# Patient Record
Sex: Female | Born: 1990 | Race: Black or African American | Hispanic: No | Marital: Married | Smoking: Former smoker
Health system: Southern US, Community
[De-identification: ages and names within clinical notes are randomized; demographics above are authoritative.]

## PROBLEM LIST (undated history)

## (undated) DIAGNOSIS — J309 Allergic rhinitis, unspecified: Secondary | ICD-10-CM

## (undated) DIAGNOSIS — E109 Type 1 diabetes mellitus without complications: Secondary | ICD-10-CM

## (undated) DIAGNOSIS — E119 Type 2 diabetes mellitus without complications: Secondary | ICD-10-CM

## (undated) DIAGNOSIS — E1110 Type 2 diabetes mellitus with ketoacidosis without coma: Secondary | ICD-10-CM

## (undated) DIAGNOSIS — Z9119 Patient's noncompliance with other medical treatment and regimen: Secondary | ICD-10-CM

## (undated) HISTORY — PX: TONSILLECTOMY: SUR1361

## (undated) HISTORY — DX: Patient's noncompliance with other medical treatment and regimen: Z91.19

## (undated) HISTORY — DX: Allergic rhinitis, unspecified: J30.9

## (undated) HISTORY — DX: Type 1 diabetes mellitus without complications: E10.9

---
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
## 2011-01-01 NOTE — Discharge Summary (Signed)
Beverly Wells, Beverly Wells                        ACCOUNT NO.:  [id number]   MEDICAL RECORD NO.:  [id number]                   PATIENT TYPE:  INP   LOCATION:  6119                                 FACILITY:  [facility]   PHYSICIAN:  Leighton Roach McDiarmid, M.D.             DATE OF BIRTH:  Apr 19, 1991   DATE OF ADMISSION:  04/27/2003  DATE OF DISCHARGE:  05/02/2003                                 DISCHARGE SUMMARY   DISCHARGE DIAGNOSES:  1. Diabetic ketoacidosis.  2. Insulin-dependent diabetes mellitus.   DISCHARGE MEDICATIONS:  1. Regular insulin 11 units before breakfast each morning and 11 units     before dinner each afternoon.  2. NPH insulin 21 units before breakfast and 19 units before dinner.   The patient was instructed to resume all other medications.   DISPOSITION:  The patient discharged to home.   FOLLOWUP:  The patient was scheduled on Wednesday, May 08, 2003, at 4  p.m. with the [facility]  appointment. This  appointment was actually for her caregivers to receive diabetes education as  we felt that the guardians and caregivers definitely need more education as  to how to control the patient's diabetes.  The patient has an  appointment  on Friday, May 10, 2003, at 12:20 p.m. with Dr. Hal Hope at [facility]. The patient was also instructed to make an  appointment with Dr. Jon Billings at [facility].  Special  instructions are given to the patient to check her blood sugar before  breakfast, before lunch, at school during the week, before dinner, and at  bedtime. She is instructed to report all numbers on a sheet which was  printed up by the child psychologist here and she was instructed to take  that sheet to all doctor's  appointment.   BRIEF ADMISSION HISTORY:  The patient is a 20 year old African American  female who presented in diabetic ketoacidosis. She was diagnosed with  diabetes in March 2004.  The  patient presented with a two- to three-day  history of vomiting, positive bedwetting over the past two to three weeks,  and  history of constipation. She had no fevers.   Her admission labs--BMET of sodium 135, potassium 5, chloride 111, CO2 16,  BUN 16, creatinine 0.8, glucose 513. She had a pH of 6.9, PCO2 26.9, and a  bicarbonate of 6. She had a white blood cell count of 16.4.   HOSPITAL COURSE:  Problem #1.  DKA.  The patient was admitted to the PICU  and placed on an insulin drip at 0.5 units/kg. He is on normal saline with  20 mEq of potassium at 200 ml per hour.  The patient had a BMET checked  every four hours, glucose checked every hour, and urine ketones checked  every hour.  After the acidosis was corrected, the patient was switched to  subcu insulin. After the patient was removed from  the insulin drip she was  transferred to a regular floor.  Before being discharged, the patient's  insulin was fine tuned to the above discharge medications. Also, social work  and child psychology were involved. The social worker was involved to help  the caregivers get a better understanding of the child's medical condition.  It was thought that she did not have adequate surveillance doing medication  administration and checking her blood sugars. It was thought that she was  either not checking her blood sugars on a daily basis or she was falsifying  her blood stick considering that her glucometer rally showed a reading  greater than 150, yet the patient's hemoglobin A1C was 15.  Child psychology  worked with the patient diligently to get her to understand what she needed  to do on a daily basis and provide her with the materials that she needed.  The patient was discharged with all appropriate school forms and also home  health nursing was set up to visit the home twice a week to download  glucometer readings and to observe insulin administration, technique, and  CBG testing with glucometer at  least once a week. The home health nurse  should report any CBG greater than 300 to Dr. Jon Billings at [facility] and to  Dr. Hal Hope at [facility].      Anastasio Auerbach, MD                          Leighton Roach McDiarmid, M.D.    AD/MEDQ  D:  05/12/2003  T:  05/13/2003  Job:  [id number]   cc:   Dr. Durwin Nora, [place] L. Hal Hope, M.D.  [address] [place]  [place] [address]  Fax: [phone]

## 2011-01-01 NOTE — Discharge Summary (Signed)
NAMESUZZANNE, Beverly Wells            ACCOUNT NO.:  [id number]   MEDICAL RECORD NO.:  [id number]          PATIENT TYPE:  INP   LOCATION:  6123                         FACILITY:  [facility]   PHYSICIAN:  Ileene Patrick        DATE OF BIRTH:  Jan 09, 1991   DATE OF ADMISSION:  05/06/2006  DATE OF DISCHARGE:                                 DISCHARGE SUMMARY   DATE OF DISCHARGE:  05/07/2006   REASON FOR HOSPITALIZATION:  Hyperglycemia.   SIGNIFICANT FINDINGS:  The patient's labs on admission were notable for a  bicarb of 22, BUN 18, creatinine 1.2 and glucose at 662.  Urinalysis had  showed specific gravity of 1.043, pH 6.0 with greater than 80 ketones and  greater than 1000 glucose.  The patient, upon interview, was found to be  noncompliant with her home insulin regimen prescribed for her diagnosis of  type 1 diabetes mellitus.  During hospitalization, the patient was resumed  on her home insulin regimen, which is 50 units of Lantus each bedtime,  NovoLog 3 times a day at meals given as 1 unit per 50 of glucose greater  than 150 and 1 unit per 15 grams of carbs to be eaten with that meal.  Each  bedtime snacks and insulin regimen is based on nomogram given by Dr. Judie Grieve.  The patient's hyperglycemia quickly resolved on this home regimen of insulin  and intravenous fluids.  Capillary blood glucose has dropped steadily and at  1430 on 05/07/2006, or at 121.  There was no evidence of DKA at any point in  this hospitalization.  There was no evidence of infection on exam.  This  hyperglycemia is apparently due to noncompliance with her insulin regimen.   TREATMENT:  Resumption of home insulin regimen as described above.  Intravenous fluids.   OPERATIONS AND PROCEDURES:  None.   FINAL DIAGNOSES:  1. Hyperglycemia secondary to insulin noncompliance.  2. Insulin dependent diabetes mellitus, type 1.   DISCHARGE MEDICATIONS/INSTRUCTIONS:  The patient is to resume home insulin  regimen as prescribed.   Dr. Lindie Spruce was contacted today prior to discharge.  Dr. Lindie Spruce will follow up with the patient with DSS and with the patient's  therapist on Monday to address the patient's noncompliance.   PENDING RESULTS/ISSUES TO BE FOLLOWED:  None.   FOLLOWUP:  The patient has a previously scheduled appointment with Dr.  Fransico Michael on 06/20/2006.   DISCHARGE CONDITION:  Stable.           ______________________________  Ileene Patrick     GH/MEDQ  D:  05/07/2006  T:  05/07/2006  Job:  [id number]   cc:   Dr. Carlean Jews, MD

## 2011-01-01 NOTE — Discharge Summary (Signed)
Beverly Wells, Beverly Wells            ACCOUNT NO.:  [id number]   MEDICAL RECORD NO.:  [id number]          PATIENT TYPE:  INP   LOCATION:  6125                         FACILITY:  [facility]   PHYSICIAN:  Henrietta Hoover, MD    DATE OF BIRTH:  Jan 08, 1991   DATE OF ADMISSION:  02/01/2006  DATE OF DISCHARGE:  02/10/2006                                 DISCHARGE SUMMARY   DISCHARGE DIAGNOSES:  1.  Diabetic ketoacidosis.  2.  Candidiasis.  3.  Bacteremia with Klebsiella and enterobacter.  4.  Bacterial vaginosis.   DISCHARGE MEDICATIONS:  1.  Levemir 55 units subcutaneously before bed.  2.  NovoLog and sliding scale as directed one unit per 50, this is numbers      greater than 150 of blood glucose sugar level.  The patient is for carb      counting is one unit per 15 grams of carbs.  3.  Nystatin powder t.i.d.  4.  Glucagon 1 mg subcutaneously p.r.n. for hyperglycemia.  5.  Flagyl  500 mg p.o. b.i.d. times six days.  6.  Zyrtec 10 mg p.o. one tablet daily.  7.  Bactrim DS two tablets p.o. b.i.d. times 10 days.  8.  Diflucan 150 mg p.o. times one dose after completing all of her      antibiotics.   DISCHARGE INSTRUCTIONS:  1.  The patient is to follow-up with Dr. Fransico Michael on April 12, 2006 at 1 PM.  2.  The patient is to follow-up with diabetic education when she returns      from her trip from [place].  3.  [facility] with Dr. Excell Seltzer on February 14, 2006 at 1 PM.   HOSPITAL COURSE:  This patient is a 20 year old African-American female with  type 1 diabetes who presented with vomiting, polyuria and polydipsia and was  found to be in DKA on admission.  The patient had a history of poor  compliance with diabetic regimen.  The patient was admitted to the PICU and  started on insulin and fluid management using the two-bag method.  After DKA  resolved, the patient was restarted on Lantus and carb counting and sliding  scale for her insulin.  The patient was transferred to the pediatric  floor  on February 03, 2006.  She received extensive diabetic education and nutrition  counseling and psychosocial assessments.  The patient was tolerating her  regular diet and giving herself insulin with adequate blood sugar control  prior to discharge with Lantus at 55 units q.h.s. at bedtime and the sliding  scale of one unit per 50 if sugar is greater than 150, and one unit per 15  grams of carbs.  The patient was also noted to have a candidal rash on  admission in her genitalia and her inner thighs.  She received Diflucan 150  mg p.o. times two and then nystatin powder three times a day.  The patient  also developed fevers on February 05, 2006.  She was well-appearing in between  her febrile episodes.  Initially there was some mild headache, however there  were none since.  Otherwise she  was asymptomatic.  On February 06, 2006, there  was a urinalysis which was negative, a urine culture which grew out  Klebsiella and staph aureus, and blood cultures that grew out gram-negative  rods, Klebsiella and enterobacter.  Her sodium on her BMET was 130,  otherwise it was normal.  Rapid strep was performed which was negative.  The  patient was medically stable.  She received four doses of ceftriaxone.  She  was afebrile after receiving antibiotics.  She was started on Flagyl for her  bacterial vaginosis.  She will complete a 10-day course of Bactrim DS at the  time of discharge to complete a total of a 14-day antibiotic course.  The  patient will be continued on the Diflucan after completing all of her  antibiotics secondary to known candidal infections.   LABORATORY DATA:  Admission labs were a white blood cell count of 9.4,  hemoglobin 15.5 and hematocrit of 52, platelet count of 343,000.  Sodium 133  potassium 5.9, chloride of 103, bicarbonate of 10, BUN of 14 and creatinine  of 1.4, glucose of 466, anion gap of 26.  ABGs showed a pH of 7.16 with a  bicarbonate of 8.  Urine pregnancy was negative.   Urinalysis showed a  specific gravity of 1.035, glucose greater than 1000, ketones greater than  80.  Free T3 was 2.7, free T4 was 1.27, TSH was 1.248.  This was obtained  secondary to her history of Hashimoto's thyroiditis.  The patient will be  followed up with Dr. Juluis Mire office and with Dr. Excell Seltzer as previously  scheduled.     ______________________________  Pediatrics Resident    ______________________________  Henrietta Hoover, MD    PR/MEDQ  D:  02/10/2006  T:  02/10/2006  Job:  [id number]

## 2011-01-01 NOTE — Discharge Summary (Signed)
Beverly Wells, Beverly Wells            ACCOUNT NO.:  [id number]   MEDICAL RECORD NO.:  [id number]          PATIENT TYPE:  INP   LOCATION:  6153                         FACILITY:  [facility]   PHYSICIAN:  Levander Campion, M.D.  DATE OF BIRTH:  [birth year]   DATE OF ADMISSION:  05/14/2006  DATE OF DISCHARGE:  05/17/2006                                 DISCHARGE SUMMARY   REASON FOR HOSPITALIZATION:  Poorly controlled Type I diabetes mellitus with  diabetic ketoacidosis.   SIGNIFICANT FINDINGS:  The patient presented to the [facility] emergency  department with nausea, vomiting, and labs consistent with DKA as follows:  Her VBG was 7.19/28.9/17.0/11.1/-15.  Her sodium was 134, her potassium was  4.9, her chloride was 96, bi carb 11, BUN 16, creatine 1.4, platelets 543.  The patient reported not taking her insulin regimen for 24 hours for an  unknown reason.  The patient is well known to the pediatric service and she  is felt to have a confounding psychiatric issue influencing her compliance.  The patient was admitted to the pediatric ICU.  She was re hydrated and  given IV insulin drip until her acidosis and electrolyte abnormalities  resolved.  The patient was then transferred to the floor and started on her  home regimen of insulin per her endocrine consultant, Dr. Fransico Michael.  Dr.  Lindie Spruce consulted for psychiatric issues and will arrange followup for her  psychiatric care with her multidisciplinary team.  She had no procedures.   FINAL DIAGNOSES:  1. Diabetic ketoacidosis.  2. Diabetes mellitus type I.  3. Hypokalemia.   DISCHARGE MEDICATIONS:  1. Levemir 50 units sub cu q.h.s.  2. Sliding scale and meal carbohydrate dosing as previously prescribed.  3. Zyrtec 10 mg p.o. daily.  4. K-Dur 40 mEq p.o. t.i.d. until the patient's basic metabolic panel is      rechecked at her primary doctor's office on Friday May 20, 2006.   FOLLOWUP:  The patient is to followup with Dr. Chales Salmon at 10:00  a.m. on  Friday, May 20, 2006.  At that time, she is to have a basic metabolic  panel drawn to follow up with her hypokalemia, which on discharge, her  potassium level was 3.0.           ______________________________  Levander Campion, M.D.     JH/MEDQ  D:  05/17/2006  T:  05/18/2006  Job:  [id number]

## 2011-01-01 NOTE — Discharge Summary (Signed)
Beverly Wells, Beverly Wells                        ACCOUNT NO.:  [id number]   MEDICAL RECORD NO.:  [id number]                   PATIENT TYPE:  INP   LOCATION:  6120                                 FACILITY:  [facility]   PHYSICIAN:  Leighton Roach McDiarmid, M.D.             DATE OF BIRTH:  06-30-1991   DATE OF ADMISSION:  11/01/2002  DATE OF DISCHARGE:  11/07/2002                                 DISCHARGE SUMMARY   DISCHARGE DIAGNOSES:  1. Diabetic ketoacidosis.  2. New onset diabetes.  3. Streptococcus pharyngitis.  4. Allergic rash.  5. Skin infection.   DISCHARGE MEDICATIONS:  1. Depakote, resume home dose.  2. Zyprexa, resume home dose.  3. Nystatin cream.  Apply to rash b.i.d.  4. Morning insulin regimen 20 units of NPH and 10 units of regular     subcutaneous injection 30 minutes prior to breakfast.  Dinner time     insulin requirement 10 units regular 30 minutes before dinner and 12     units NPH at bedtime.  Glucagon kit has been ordered 1 mg subcutaneous     injection if unconscious for low blood sugars.  She is adhere to the     following sliding scale insulin criteria with regular insulin.  Sugars     200 to 230, 1 unit; 231 to 260, 2 units; 261 to 290, 3 units, and 291 to     320, 4 units.  For every unit, 30 mg/dl above 914.  She is to call her MD     for capillary blood glucose greater than 500.  Instructions for blood     sugar checks are to check 30 minutes before breakfast, 2 hours after     lunch, and at bedtime. She is to chart these in her book and return to     both her primary physician and endocrinologist.   DIET:  Diabetic diet.  Recommendations are as taught while in hospital.   FOLLOW UP:  1. Dr. Danise Mina, endocrinologist in [place].  2. Dr. Ladona Ridgel, psychiatrist on 11/14/2002 at 2 o'clock p.m. regarding     behavioral problems, currently on Zyprexa and Depakote.  3. Diabetic outpatient nutrition management on April 6 at 3 o'clock to meet     with  Army Fossa.  4. [facility] on April 5 at 9 a.m.   HISTORY OF PRESENT ILLNESS:  The patient is a 20 year old African-American  female who presented with a past medical history significant for allergic  rhinitis, anuresis, and behavioral problems.  She was sent to [facility]  by nurse practitioner Toni Arthurs and Marcos Eke. Hal Hope, M.D. at [facility]  due to hyperglycemia and dehydration. The patient presented the day prior to  admission to [facility] complaining of sore throat.  The  patient was evaluated by her primary care physician and diagnosed with strep  pharyngitis and genital candidiasis.  She was also complaining of dysuria,  urgency, and daytime anuresis.  Urinalysis was ordered which showed large  glucose and ketones.  CBG in the office was too high to read.  CMET, CBC,  TSH, hemoglobin A1C, and  C Peptide were ordered and grossly abnormal with  glucose of 549, bicarb 14, sodium 122,  Hemoglobin A1C 11.1.  When the labs  came back, the patient was sent to [facility] for evaluation for DKA and new  diagnosis of diabetes.    HOSPITAL COURSE:  1. DIABETIC KETOACIDOSIS:  The patient was treated with aggressive IV fluids     rehydration with lactated Ringers and admitted to the pediatric intensive     care unit under the care of Dr. Gerome Sam.  She was begun on insulin     drip at 0.1 units/kg per hour with q.1h. CBG checks and q.2h. BMET and     venous blood gases.  Clinically, the patient remained stable.  She never     lost consciousness, was alert and oriented x 3 in no acute distress.  Her     blood gas on admission showed a pH of 7.224, PCO2 23, PO2 112, bicarb 12,     and base excess negative 16.  She was 97% on room air.  BMP showed a     sodium of 129, potassium 4.4, chloride 98, bicarb decreased at 12, BUN     12, creatinine 1.0, and glucose 421.  Anion gap on admission was 19.     Hemoglobin A1C was 11.1, and C peptide was less  than 1.  CBC on admission     showed a normal white count of 5.7; therefore strep pharyngitis was     unlikely to be cause of her DKA.  She continued on insulin drip until     blood sugar reached roughly 200. She continued to have mild ketones     present in her urine, but her acidosis quickly resolved by hospital day     #2.  On the day that she was switched over to subcutaneous insulin,     hospital day #2, and she was started at 0.5 units insulin per kilogram of     body weight per day and started at NPH 12 units and regular 5 units in     the morning, NPH 4 units at night, and regular 4 units at night.  CBG     checked were spaced out to q.a.c. and q.h.s. and q.a.m.  Diabetic     teaching was begun, and the patient was placed on a pediatric     carbohydrate modified diet. Urine ketones continued to be checked every     day.  Blood sugars continually remained in the 200 to 300s and even some     sugars in the 400s.  Every day, her NPH and regular were steadily     increased.  At discharge time, her fasting blood sugars are roughly 140s     to 150s.  She periodically does have elevated blood sugars in the     afternoon; however, she does not fully understand the diabetic diet and     has shown increased carbohydrate intake above what her goal is.  She was     set up for [facility] outpatient diabetes education center and was     encouraged to have all her family members present for this teaching.  She     also received education on  Accu-Cheks and insulin administration.  Her     mother and great aunt were both there for education.  She was discharged     home with a regimen of 20 units NPH and 10 units regular in the morning     and 12 units NPH and 10 units regular at bedtime.  She was instructed to     check fasting blood sugars in the morning 2 hours postprandial and at     bedtime.   1. STREPTOCOCCUS PHARYNGITIS:  The patient was checked for strep pharyngitis    and had a positive  strep test in the office.  She was started on     amoxicillin on hospital day #1 and quickly broke into an erythematous     maculopapular rash.  Amoxicillin was discontinued, and she was switched     to three days of Zithromax 500 mg daily and did well without     complications.  By hospital day #3, the patient stopped complaining of     sore throat and was able to drink without difficulty.  She remained     afebrile throughout her hospital stay.  For the allergic rash, she     received symptomatic care with Benadryl and Claritin.   1. CANDIDAL SKIN INFECTION:  The patient presented with a groin rash that     showed erythematous satellite lesions extending from her groin, below the     pubic dome and even had pruritus in her vaginal area.  She used     Clotrimazole cream for vaginal use and Nystatin cream for the satellite     lesions in the groin area. Her pruritus has decreased and is likely     secondary to hypoglycemia. She will continue using Nystatin cream twice a     day until the rash resolves.   1. BEHAVIORAL PROBLEMS:  The patient came in with a diagnosis of behavioral     problems, currently on Depakote and Zyprexa.  Per family history, she     gets in a lot of trouble at school and has not seen a psychiatrist in the     past.  Her Zyprexa was held during hospital stay, and she had no     behavioral changes or outbreaks while in the hospital.  She may resume     this as an outpatient, and social work was consulted and set up an     appointment with Dr. Ladona Ridgel, psychiatrist, on 11/14/2002.  She was also     seen by Dr. Lindie Spruce, our in hospital child psychologist.   1. SOCIAL:  Custody was an issue during her hospitalization.  Apparently the     patient's mother does not liver full time at the child's home.  This     should be explored further as an outpatient as there is risk for this     child to go into diabetic ketoacidosis if not fully observed with her     insulin injections  and blood sugar checks.  It is also very important     that her primary care givers adhere to diabetic diet.  Please review as     an outpatient.   FOLLOW UP:  1. Compliance with insulin injections.  May need to titrate up on her     insulin dose if still hyperglycemic.  2. Compliance with insulin and blood sugar checks at school.  3. Follow up with Dr. Ladona Ridgel, psychiatrist, on 11/14/2002.  4. Follow  up with Dr. Danise Mina, endocrinologist in [place].     Appointment to be made.  5. Follow up with [facility] outpatient diabetic education center.  6. Follow up with [facility] April 5.  7. Consider changing Zyprexa and Depakote.  8. Review social situation once again and make sure that someone is     observing the patient's administration of insulin, diet, and blood sugar     checks.     Lorne Skeens, D.O.                         Etta Grandchild, M.D.    Erick Alley  D:  11/08/2002  T:  11/09/2002  Job:  [id number]  cc:   Clydie Braun L. Hal Hope, M.D.  [address] [place]  [place] [address]  Fax: [phone]   Danise Mina, M.D.  [place] [phone])   Carolanne Grumbling, M.D.  Fax: [phone]

## 2011-01-01 NOTE — Discharge Summary (Signed)
Beverly Wells, Beverly Wells            ACCOUNT NO.:  [id number]   MEDICAL RECORD NO.:  [id number]          PATIENT TYPE:  INP   LOCATION:  6125                         FACILITY:  [facility]   PHYSICIAN:  Henrietta Hoover, MD    DATE OF BIRTH:  09-Sep-1990   DATE OF ADMISSION:  02/01/2006  DATE OF DISCHARGE:  02/10/2006                                 DISCHARGE SUMMARY   DISCHARGE DIAGNOSES:  1.  Diabetic __________.  2.  Candidiasis.  3.  Bacteremia with Klebsiella enterobacter.  4.  Bacterial vaginosis.   DISCHARGE MEDICATIONS:  1.  Levemir 55 units subcutaneously before bed.  2.  NovoLog aspart take correction sliding scale as directed.  Sliding scale      is one unit per 50 of glucose greater than 150.  Carb counting is one      unit per 15 g of carbs.  3.  Nystatin powder t.i.d.  4.  __________ subcutaneously p.r.n. hyperglycemia.  5.  Flagyl 700 mg p.o. b.i.d. x6 days.  6.  Bactrim DS two tablets p.o. b.i.d.  7.  Diflucan 150 mg p.o. x1 dose.  8.  Zyrtec 10 mg tablets p.o. two daily.   DISCHARGE INSTRUCTIONS:  FOLLOW UP:  Patient is to followup with Dr. Manson Passey  on April 12, 2006, at 1 p.m.   Dictation ended at this point.           ______________________________  Henrietta Hoover, MD     SN/MEDQ  D:  02/10/2006  T:  02/10/2006  Job:  [id number]

## 2011-01-01 NOTE — Discharge Summary (Signed)
NAMEDELITA, CHIQUITO            ACCOUNT NO.:  [id number]   MEDICAL RECORD NO.:  [id number]          PATIENT TYPE:  INP   LOCATION:  6125                         FACILITY:  [facility]   PHYSICIAN:  Henrietta Hoover, MD    DATE OF BIRTH:  [birth year]   DATE OF ADMISSION:  02/01/2006  DATE OF DISCHARGE:  02/10/2006                                 DISCHARGE SUMMARY   ATTENDING PHYSICIAN ON DATE OF ADMISSION:  Tyrone Apple. Sharol Harness, MD   ATTENDING PHYSICIAN ON DATE OF DISCHARGE:  Henrietta Hoover, MD   DISCHARGE DIAGNOSES:  1.  Diabetic ketoacidosis.  2.  Vaginal candidiasis.  3.  Bacteremia with Klebsiella and Enterobacter.  4.  Bacterial vaginosis.   OPERATIONS AND PROCEDURES:  Admission labs were significant for a sodium of  133, chloride of 103, bicarb of 10, potassium of 5.9, a BUN of 14, and  creatinine of 1.4, with glucose of 466.  Anion gap was 26.  ABG was 7.16 pH,  CO2 was 23, oxygen was 103, bicarb was 8, base excess was -18.  She had  urine pregnancy was negative.  Urinalysis showed greater than 1000 glucose,  greater than 80 ketones, and a specific gravity of 1.035.  White count was  9.4, hemoglobin 16.5, hematocrit of 52, and platelets   Dictation ended at this point.      Barth Kirks, M.D.    ______________________________  Henrietta Hoover, MD    MB/MEDQ  D:  02/10/2006  T:  02/10/2006  Job:  [id number]

## 2011-01-17 ENCOUNTER — Emergency Department (HOSPITAL_COMMUNITY): Payer: No Typology Code available for payment source

## 2011-01-17 ENCOUNTER — Emergency Department (HOSPITAL_COMMUNITY)
Admission: EM | Admit: 2011-01-17 | Discharge: 2011-01-18 | Disposition: A | Discharge status: Home / Self Care | Payer: No Typology Code available for payment source | Attending: Emergency Medicine | Admitting: Emergency Medicine

## 2011-01-17 DIAGNOSIS — M25569 Pain in unspecified knee: Secondary | ICD-10-CM | POA: Insufficient documentation

## 2011-01-17 DIAGNOSIS — M542 Cervicalgia: Secondary | ICD-10-CM | POA: Insufficient documentation

## 2011-01-17 DIAGNOSIS — T148XXA Other injury of unspecified body region, initial encounter: Secondary | ICD-10-CM | POA: Insufficient documentation

## 2011-01-17 DIAGNOSIS — Z794 Long term (current) use of insulin: Secondary | ICD-10-CM | POA: Insufficient documentation

## 2011-01-17 DIAGNOSIS — E119 Type 2 diabetes mellitus without complications: Secondary | ICD-10-CM | POA: Insufficient documentation

## 2011-01-17 DIAGNOSIS — M549 Dorsalgia, unspecified: Secondary | ICD-10-CM | POA: Insufficient documentation

## 2011-01-18 ENCOUNTER — Ambulatory Visit: Payer: Medicaid Other | Admitting: Endocrinology

## 2011-01-18 DIAGNOSIS — Z0289 Encounter for other administrative examinations: Secondary | ICD-10-CM

## 2011-05-10 ENCOUNTER — Emergency Department (HOSPITAL_BASED_OUTPATIENT_CLINIC_OR_DEPARTMENT_OTHER)
Admission: EM | Admit: 2011-05-10 | Discharge: 2011-05-10 | Disposition: A | Discharge status: Home / Self Care | Payer: Medicaid Other | Attending: Emergency Medicine | Admitting: Emergency Medicine

## 2011-05-10 ENCOUNTER — Encounter (HOSPITAL_BASED_OUTPATIENT_CLINIC_OR_DEPARTMENT_OTHER): Payer: Self-pay

## 2011-05-10 DIAGNOSIS — E1169 Type 2 diabetes mellitus with other specified complication: Secondary | ICD-10-CM | POA: Insufficient documentation

## 2011-05-10 DIAGNOSIS — K5289 Other specified noninfective gastroenteritis and colitis: Secondary | ICD-10-CM | POA: Insufficient documentation

## 2011-05-10 DIAGNOSIS — R739 Hyperglycemia, unspecified: Secondary | ICD-10-CM

## 2011-05-10 DIAGNOSIS — R112 Nausea with vomiting, unspecified: Secondary | ICD-10-CM | POA: Insufficient documentation

## 2011-05-10 DIAGNOSIS — R109 Unspecified abdominal pain: Secondary | ICD-10-CM | POA: Insufficient documentation

## 2011-05-10 DIAGNOSIS — Z794 Long term (current) use of insulin: Secondary | ICD-10-CM | POA: Insufficient documentation

## 2011-05-10 DIAGNOSIS — K529 Noninfective gastroenteritis and colitis, unspecified: Secondary | ICD-10-CM

## 2011-05-10 LAB — URINALYSIS, ROUTINE W REFLEX MICROSCOPIC
Bilirubin Urine: NEGATIVE
Ketones, ur: >80 mg/dL — AB
Nitrite: NEGATIVE
Protein, ur: NEGATIVE mg/dL
Specific Gravity, Urine: 1.038 — ABNORMAL HIGH (ref 1.005–1.030)
Urobilinogen, UA: 0.2 mg/dL (ref 0.0–1.0)

## 2011-05-10 LAB — POCT I-STAT 3, VENOUS BLOOD GAS (G3P V)
Bicarbonate: 19 mEq/L — ABNORMAL LOW (ref 20.0–24.0)
TCO2: 20 mmol/L (ref 0–100)
pCO2, Ven: 39.1 mmHg — ABNORMAL LOW (ref 45.0–50.0)
pH, Ven: 7.291 (ref 7.250–7.300)
pO2, Ven: 23 mmHg — CL (ref 30.0–45.0)

## 2011-05-10 LAB — CBC
Hemoglobin: 13.4 g/dL (ref 12.0–15.0)
MCH: 24.1 pg — ABNORMAL LOW (ref 26.0–34.0)
MCV: 74.1 fL — ABNORMAL LOW (ref 78.0–100.0)
RBC: 5.56 MIL/uL — ABNORMAL HIGH (ref 3.87–5.11)

## 2011-05-10 LAB — DIFFERENTIAL
Eosinophils Absolute: 0 10*3/uL (ref 0.0–0.7)
Lymphocytes Relative: 8 % — ABNORMAL LOW (ref 12–46)
Lymphs Abs: 1 10*3/uL (ref 0.7–4.0)
Monocytes Relative: 3 % (ref 3–12)
Neutrophils Relative %: 89 % — ABNORMAL HIGH (ref 43–77)

## 2011-05-10 LAB — COMPREHENSIVE METABOLIC PANEL
Alkaline Phosphatase: 122 U/L — ABNORMAL HIGH (ref 39–117)
BUN: 12 mg/dL (ref 6–23)
CO2: 17 mEq/L — ABNORMAL LOW (ref 19–32)
GFR calc Af Amer: >60 mL/min (ref >60)
GFR calc non Af Amer: >60 mL/min (ref >60)
Glucose, Bld: 491 mg/dL — ABNORMAL HIGH (ref 70–99)
Potassium: 4.7 mEq/L (ref 3.5–5.1)
Total Bilirubin: 0.5 mg/dL (ref 0.3–1.2)
Total Protein: 8.8 g/dL — ABNORMAL HIGH (ref 6.0–8.3)

## 2011-05-10 LAB — URINE MICROSCOPIC-ADD ON

## 2011-05-10 LAB — PREGNANCY, URINE: Preg Test, Ur: NEGATIVE

## 2011-05-10 MED ORDER — ONDANSETRON HCL 4 MG PO TABS: 4.0000 mg | ORAL_TABLET | Freq: Four times a day (QID) | ORAL | Status: AC

## 2011-05-10 MED ORDER — SODIUM CHLORIDE 0.9 % IV SOLN
INTRAVENOUS | Status: DC
  Administered 2011-05-10: 12:00:00 via INTRAVENOUS

## 2011-05-10 MED ORDER — ONDANSETRON HCL 4 MG/2ML IJ SOLN
4.0000 mg | Freq: Once | INTRAMUSCULAR | Status: AC
  Administered 2011-05-10: 4 mg via INTRAVENOUS
  Filled 2011-05-10: qty 2

## 2011-05-10 MED ORDER — MORPHINE SULFATE 4 MG/ML IJ SOLN
4.0000 mg | Freq: Once | INTRAMUSCULAR | Status: AC
  Administered 2011-05-10: 4 mg via INTRAVENOUS

## 2011-05-10 MED ORDER — SODIUM CHLORIDE 0.9 % IV SOLN
Freq: Once | INTRAVENOUS | Status: AC
  Administered 2011-05-10: 10:00:00 via INTRAVENOUS

## 2011-05-10 MED ORDER — MORPHINE SULFATE 4 MG/ML IJ SOLN
INTRAMUSCULAR | Status: AC
  Filled 2011-05-10: qty 1

## 2011-05-10 MED ORDER — OXYCODONE-ACETAMINOPHEN 5-325 MG PO TABS: 1.0000 | ORAL_TABLET | ORAL | Status: AC | PRN

## 2011-05-10 MED ORDER — DEXTROSE 50 % IV SOLN: 25.0000 mL | INTRAVENOUS | Status: DC | PRN

## 2011-05-10 MED ORDER — DEXTROSE-NACL 5-0.45 % IV SOLN: INTRAVENOUS | Status: DC

## 2011-05-10 MED ORDER — INSULIN REGULAR HUMAN 100 UNIT/ML IJ SOLN
1.0000 [IU] | Freq: Once | INTRAMUSCULAR | Status: AC
  Administered 2011-05-10: 100 [IU] via INTRAVENOUS
  Filled 2011-05-10: qty 3

## 2011-05-10 MED ORDER — SODIUM CHLORIDE 0.9 % IV BOLUS (SEPSIS): 1000.0000 mL | Freq: Once | INTRAVENOUS | Status: DC

## 2011-05-10 NOTE — ED Provider Notes (Addendum)
History     CSN: [id number] Arrival date & time: 05/10/2011  9:34 AM  Chief Complaint  Patient presents with  . Nausea  . Emesis  . Abdominal Cramping    HPI  (Consider location/radiation/quality/duration/timing/severity/associated sxs/prior treatment)  The history is provided by the patient.  nausea vomiting and diarrhea that began yesterday. Also diffuse abdominal cramping and pain. She states that she has been unable to eat. No sick contacts. She states that her sugars have been high, but has not checked them today. She has urinary frequency. She has not taken any medicines to help. She denies pregnancy.   Past Medical History  Diagnosis Date  . 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 ALLERGIC RHINITIS 05/07/2010    History reviewed. No pertinent past surgical history.  Family History  Problem Relation Age of Onset  . Diabetes Neg Hx     History  Substance Use Topics  . Smoking status: Never Smoker   . Smokeless tobacco: Not on file  . Alcohol Use: No    OB History    Grav Para Term Preterm Abortions TAB SAB Ect Mult Living                  Review of Systems  Review of Systems  Constitutional: Positive for appetite change. Negative for activity change.  HENT: Negative for neck stiffness.   Eyes: Negative for pain.  Respiratory: Negative for chest tightness and shortness of breath.   Cardiovascular: Negative for chest pain and leg swelling.  Gastrointestinal: Positive for nausea, vomiting, abdominal pain and diarrhea. Negative for blood in stool.  Genitourinary: Positive for frequency. Negative for dysuria and flank pain.  Musculoskeletal: Negative for back pain.  Skin: Negative for rash.  Neurological: Negative for weakness, numbness and headaches.  Psychiatric/Behavioral: Negative for behavioral problems.    Allergies  Penicillins  Home Medications   Current Outpatient Rx  Name Route Sig Dispense Refill  .  CETIRIZINE HCL 10 MG PO TABS Oral Take 10 mg by mouth daily.      Marland Kitchen GLUCOSE BLOOD VI STRP  4x a day, dx 250.01, variable glucoses     . INSULIN DETEMIR 100 UNIT/ML [place] SOLN Subcutaneous Inject 90 Units into the skin every morning.      Marland Kitchen MONTELUKAST SODIUM 10 MG PO TABS  Once daily       Physical Exam    BP 103/58  Pulse 92  Temp(Src) 97.5 F (36.4 C) (Oral)  Resp 16  SpO2 100%  LMP 05/10/2011  Physical Exam  Nursing note and vitals reviewed. Constitutional: She is oriented to person, place, and time. She appears well-developed.  HENT:  Head: Normocephalic and atraumatic.  Eyes: EOM are normal. Pupils are equal, round, and reactive to light.  Neck: Normal range of motion. Neck supple.  Cardiovascular: Regular rhythm and normal heart sounds.   No murmur heard. Pulmonary/Chest: Effort normal and breath sounds normal. No respiratory distress. She has no wheezes. She has no rales. Tenderness: diffuse tenderness without rebound or guarding.   Abdominal: Soft. Bowel sounds are normal. She exhibits no distension. There is tenderness. There is no rebound and no guarding.  Musculoskeletal: Normal range of motion.  Neurological: She is alert and oriented to person, place, and time. No cranial nerve deficit.  Skin: Skin is warm and dry.  Psychiatric: She has a normal mood and affect. Her speech is normal.    ED Course  Procedures (including critical  care time)  Labs Reviewed  CBC - Abnormal; Notable for the following:    WBC 12.6 (*)    RBC 5.56 (*)    MCV 74.1 (*)    MCH 24.1 (*)    All other components within normal limits  DIFFERENTIAL - Abnormal; Notable for the following:    Neutrophils Relative 89 (*)    Neutro Abs 11.1 (*)    Lymphocytes Relative 8 (*)    All other components within normal limits  COMPREHENSIVE METABOLIC PANEL - Abnormal; Notable for the following:    Sodium 134 (*)    Chloride 93 (*)    CO2 17 (*)    Glucose, Bld 491 (*)    Total Protein 8.8 (*)     AST 42 (*)    Alkaline Phosphatase 122 (*)    All other components within normal limits  URINALYSIS, ROUTINE W REFLEX MICROSCOPIC - Abnormal; Notable for the following:    Specific Gravity, Urine 1.038 (*)    Glucose, UA >1000 (*)    Hgb urine dipstick TRACE (*)    Ketones, ur >80 (*)    All other components within normal limits  LIPASE, BLOOD - Abnormal; Notable for the following:    Lipase 9 (*)    All other components within normal limits  POCT I-STAT 3, BLOOD GAS (G3P V) - Abnormal; Notable for the following:    pCO2, Ven 39.1 (*)    pO2, Ven 23.0 (*)    Bicarbonate 19.0 (*)    Acid-base deficit 7.0 (*)    All other components within normal limits  GLUCOSE, CAPILLARY - Abnormal; Notable for the following:    Glucose-Capillary 401 (*)    All other components within normal limits  GLUCOSE, CAPILLARY - Abnormal; Notable for the following:    Glucose-Capillary 356 (*)    All other components within normal limits  GLUCOSE, CAPILLARY - Abnormal; Notable for the following:    Glucose-Capillary 243 (*)    All other components within normal limits  GLUCOSE, CAPILLARY - Abnormal; Notable for the following:    Glucose-Capillary 158 (*)    All other components within normal limits  PREGNANCY, URINE  URINE MICROSCOPIC-ADD ON  BLOOD GAS, VENOUS  POCT CBG MONITORING   No results found.   No diagnosis found. Hyperglycemia. NVD. PH normal, but bicarb of 17 and ketones in urine. On glucose stabilizer. CBG improving and tolerating orals.          Juliet Rude. Rubin Payor, MD 05/10/11 1540  Juliet Rude. Rubin Payor, MD 05/10/11 1642

## 2011-05-10 NOTE — ED Notes (Signed)
Attempted IV access x 1 - unsuccessful.

## 2011-05-10 NOTE — ED Notes (Signed)
Pt reports an increase in abdominal cramping.  ABG collected as ordered.

## 2011-05-10 NOTE — ED Notes (Signed)
Pt c/o nausea, vomiting and abdominal cramps.

## 2011-05-10 NOTE — ED Notes (Signed)
Pt assisted to BR.  Family at bedside.

## 2011-05-27 LAB — URINALYSIS, ROUTINE W REFLEX MICROSCOPIC
Bilirubin Urine: NEGATIVE
Glucose, UA: >1000 — AB
Hgb urine dipstick: NEGATIVE
Specific Gravity, Urine: 1.038 — ABNORMAL HIGH
Urobilinogen, UA: 0.2

## 2011-05-27 LAB — COMPREHENSIVE METABOLIC PANEL
ALT: 14
AST: 16
Alkaline Phosphatase: 138
CO2: 22
Glucose, Bld: 733
Potassium: 5.2 — ABNORMAL HIGH
Sodium: 128 — ABNORMAL LOW
Total Protein: 7.6

## 2011-05-27 LAB — URINE MICROSCOPIC-ADD ON

## 2011-05-27 LAB — BASIC METABOLIC PANEL
BUN: 13
BUN: 18
CO2: 26
Calcium: 9.5
Calcium: 9.9
Creatinine, Ser: 0.97
Glucose, Bld: 311 — ABNORMAL HIGH
Glucose, Bld: 578

## 2011-05-27 LAB — HEMOGLOBIN A1C: Hgb A1c MFr Bld: 12.1 — ABNORMAL HIGH

## 2011-05-27 LAB — POCT I-STAT 3, ART BLOOD GAS (G3+)
Acid-base deficit: 4 — ABNORMAL HIGH
O2 Saturation: 86
Operator id: 270211
Patient temperature: 97.1
pO2, Arterial: 47 — ABNORMAL LOW

## 2011-05-27 LAB — POCT PREGNANCY, URINE: Preg Test, Ur: NEGATIVE

## 2011-05-27 LAB — KETONES, URINE: Ketones, ur: NEGATIVE

## 2011-06-03 ENCOUNTER — Emergency Department (HOSPITAL_COMMUNITY): Payer: Medicaid Other

## 2011-06-03 ENCOUNTER — Emergency Department (HOSPITAL_COMMUNITY)
Admission: EM | Admit: 2011-06-03 | Discharge: 2011-06-03 | Discharge status: Against Medical Advice | Payer: Medicaid Other | Attending: Emergency Medicine | Admitting: Emergency Medicine

## 2011-06-03 DIAGNOSIS — R05 Cough: Secondary | ICD-10-CM | POA: Insufficient documentation

## 2011-06-03 DIAGNOSIS — E119 Type 2 diabetes mellitus without complications: Secondary | ICD-10-CM | POA: Insufficient documentation

## 2011-06-03 DIAGNOSIS — R000 Tachycardia, unspecified: Secondary | ICD-10-CM | POA: Insufficient documentation

## 2011-06-03 DIAGNOSIS — R0602 Shortness of breath: Secondary | ICD-10-CM | POA: Insufficient documentation

## 2011-06-03 DIAGNOSIS — R059 Cough, unspecified: Secondary | ICD-10-CM | POA: Insufficient documentation

## 2011-06-03 DIAGNOSIS — R0789 Other chest pain: Secondary | ICD-10-CM | POA: Insufficient documentation

## 2011-06-03 DIAGNOSIS — J3489 Other specified disorders of nose and nasal sinuses: Secondary | ICD-10-CM | POA: Insufficient documentation

## 2011-06-03 LAB — URINALYSIS, ROUTINE W REFLEX MICROSCOPIC
Ketones, ur: 15 mg/dL — AB
Leukocytes, UA: NEGATIVE
Nitrite: NEGATIVE
Specific Gravity, Urine: 1.005 (ref 1.005–1.030)
Urobilinogen, UA: 1 mg/dL (ref 0.0–1.0)
pH: 7 (ref 5.0–8.0)

## 2011-06-03 LAB — PREGNANCY, URINE: Preg Test, Ur: NEGATIVE

## 2011-06-03 LAB — URINE MICROSCOPIC-ADD ON

## 2011-10-20 ENCOUNTER — Emergency Department (HOSPITAL_BASED_OUTPATIENT_CLINIC_OR_DEPARTMENT_OTHER)
Admission: EM | Admit: 2011-10-20 | Discharge: 2011-10-20 | Disposition: A | Discharge status: Home Health | Payer: Medicaid Other | Attending: Emergency Medicine | Admitting: Emergency Medicine

## 2011-10-20 ENCOUNTER — Encounter (HOSPITAL_BASED_OUTPATIENT_CLINIC_OR_DEPARTMENT_OTHER): Payer: Self-pay | Admitting: Family Medicine

## 2011-10-20 DIAGNOSIS — E1169 Type 2 diabetes mellitus with other specified complication: Secondary | ICD-10-CM | POA: Insufficient documentation

## 2011-10-20 DIAGNOSIS — R110 Nausea: Secondary | ICD-10-CM | POA: Insufficient documentation

## 2011-10-20 DIAGNOSIS — R739 Hyperglycemia, unspecified: Secondary | ICD-10-CM

## 2011-10-20 DIAGNOSIS — R252 Cramp and spasm: Secondary | ICD-10-CM | POA: Insufficient documentation

## 2011-10-20 DIAGNOSIS — Z794 Long term (current) use of insulin: Secondary | ICD-10-CM | POA: Insufficient documentation

## 2011-10-20 LAB — POCT I-STAT 3, VENOUS BLOOD GAS (G3P V)
Bicarbonate: 28.9 mEq/L — ABNORMAL HIGH (ref 20.0–24.0)
Patient temperature: 98.7
TCO2: 30 mmol/L (ref 0–100)
pCO2, Ven: 54.2 mmHg — ABNORMAL HIGH (ref 45.0–50.0)
pH, Ven: 7.335 — ABNORMAL HIGH (ref 7.250–7.300)
pO2, Ven: 22 mmHg — CL (ref 30.0–45.0)

## 2011-10-20 LAB — URINALYSIS, ROUTINE W REFLEX MICROSCOPIC
Bilirubin Urine: NEGATIVE
Glucose, UA: >1000 mg/dL — AB
Hgb urine dipstick: NEGATIVE
Ketones, ur: NEGATIVE mg/dL
Protein, ur: NEGATIVE mg/dL
Urobilinogen, UA: 0.2 mg/dL (ref 0.0–1.0)

## 2011-10-20 LAB — BASIC METABOLIC PANEL
BUN: 12 mg/dL (ref 6–23)
CO2: 27 mEq/L (ref 19–32)
Calcium: 10.5 mg/dL (ref 8.4–10.5)
Creatinine, Ser: 0.6 mg/dL (ref 0.50–1.10)
GFR calc non Af Amer: >90 mL/min (ref >90)
Glucose, Bld: 113 mg/dL — ABNORMAL HIGH (ref 70–99)
Sodium: 141 mEq/L (ref 135–145)

## 2011-10-20 LAB — DIFFERENTIAL
Basophils Absolute: 0 10*3/uL (ref 0.0–0.1)
Basophils Relative: 0 % (ref 0–1)
Eosinophils Absolute: 0.2 10*3/uL (ref 0.0–0.7)
Lymphocytes Relative: 37 % (ref 12–46)
Monocytes Absolute: 0.5 10*3/uL (ref 0.1–1.0)
Neutro Abs: 2.4 10*3/uL (ref 1.7–7.7)

## 2011-10-20 LAB — URINE MICROSCOPIC-ADD ON

## 2011-10-20 LAB — CBC
HCT: 38.1 % (ref 36.0–46.0)
MCH: 23.5 pg — ABNORMAL LOW (ref 26.0–34.0)
MCHC: 33.1 g/dL (ref 30.0–36.0)
RDW: 14.3 % (ref 11.5–15.5)

## 2011-10-20 LAB — PREGNANCY, URINE: Preg Test, Ur: NEGATIVE

## 2011-10-20 LAB — GLUCOSE, CAPILLARY: Glucose-Capillary: 156 mg/dL — ABNORMAL HIGH (ref 70–99)

## 2011-10-20 MED ORDER — SODIUM CHLORIDE 0.9 % IV BOLUS (SEPSIS)
1000.0000 mL | Freq: Once | INTRAVENOUS | Status: AC
  Administered 2011-10-20: 1000 mL via INTRAVENOUS

## 2011-10-20 NOTE — ED Notes (Signed)
Pt c/o increased thirst, frequent urination, hand and arm spasms and nausea x 1 day. Pt sts she did not take her insulin yesterday and does not regularly check blood sugar.

## 2011-10-20 NOTE — ED Provider Notes (Signed)
History     CSN: [id number]  Arrival date & time 10/20/11  4098   First MD Initiated Contact with Patient 10/20/11 [phone]      Chief Complaint  Patient presents with  . Hyperglycemia    (Consider location/radiation/quality/duration/timing/severity/associated sxs/prior treatment) Patient is a 21 y.o. female presenting with musculoskeletal pain. The history is provided by the patient.  Muscle Pain This is a new (Patient is an insulin-dependent diabetic who did not take her insulin yesterday when she woke up this morning had nausea and muscle cramps. She took the insulin at about 8:00 this morning 8 units and now the nausea starting to improve) problem. The current episode started 3 to 5 hours ago. The problem occurs constantly. The problem has not changed since onset.Associated symptoms include abdominal pain. Pertinent negatives include no chest pain and no shortness of breath. The symptoms are aggravated by nothing. Relieved by: Has gotten better said she took insulin this morning. Treatments tried: insulin. The treatment provided mild relief.    Past Medical History  Diagnosis Date  . 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 ALLERGIC RHINITIS 05/07/2010    History reviewed. No pertinent past surgical history.  Family History  Problem Relation Age of Onset  . Diabetes Neg Hx     History  Substance Use Topics  . Smoking status: Never Smoker   . Smokeless tobacco: Not on file  . Alcohol Use: No    OB History    Grav Para Term Preterm Abortions TAB SAB Ect Mult Living                  Review of Systems  Respiratory: Negative for shortness of breath.   Cardiovascular: Negative for chest pain.  Gastrointestinal: Positive for abdominal pain.  All other systems reviewed and are negative.    Allergies  Penicillins  Home Medications   Current Outpatient Rx  Name Route Sig Dispense Refill  . CETIRIZINE HCL 10 MG PO TABS Oral Take  10 mg by mouth daily.      Marland Kitchen GLUCOSE BLOOD VI STRP  4x a day, dx 250.01, variable glucoses     . INSULIN DETEMIR 100 UNIT/ML [place] SOLN Subcutaneous Inject 90 Units into the skin every morning.      Marland Kitchen MONTELUKAST SODIUM 10 MG PO TABS  Once daily       BP 115/79  Pulse 106  Temp(Src) 98.7 F (37.1 C) (Oral)  Resp 16  SpO2 100%  LMP 09/22/2011  Physical Exam  Nursing note and vitals reviewed. Constitutional: She is oriented to person, place, and time. She appears well-developed and well-nourished. No distress.  HENT:  Head: Normocephalic and atraumatic.  Eyes: EOM are normal. Pupils are equal, round, and reactive to light.  Cardiovascular: Regular rhythm, normal heart sounds and intact distal pulses.  Tachycardia present.  Exam reveals no friction rub.   No murmur heard. Pulmonary/Chest: Effort normal and breath sounds normal. She has no wheezes. She has no rales.  Abdominal: Soft. Bowel sounds are normal. She exhibits no distension. There is no tenderness. There is no rebound and no guarding.  Musculoskeletal: Normal range of motion. She exhibits no tenderness.       No edema  Neurological: She is alert and oriented to person, place, and time. No cranial nerve deficit.  Skin: Skin is warm and dry. No rash noted.  Psychiatric: She has a normal mood and affect. Her behavior is normal.  ED Course  Procedures (including critical care time)  Labs Reviewed  GLUCOSE, CAPILLARY - Abnormal; Notable for the following:    Glucose-Capillary 156 (*)    All other components within normal limits  CBC - Abnormal; Notable for the following:    RBC 5.36 (*)    MCV 71.1 (*)    MCH 23.5 (*)    All other components within normal limits  BASIC METABOLIC PANEL - Abnormal; Notable for the following:    Glucose, Bld 113 (*)    All other components within normal limits  URINALYSIS, ROUTINE W REFLEX MICROSCOPIC - Abnormal; Notable for the following:    Glucose, UA >1000 (*)    All other components  within normal limits  URINE MICROSCOPIC-ADD ON - Abnormal; Notable for the following:    Bacteria, UA FEW (*)    All other components within normal limits  POCT I-STAT 3, BLOOD GAS (G3P V) - Abnormal; Notable for the following:    pH, Ven 7.335 (*)    pCO2, Ven 54.2 (*)    pO2, Ven 22.0 (*)    Bicarbonate 28.9 (*)    All other components within normal limits  DIFFERENTIAL  PREGNANCY, URINE   No results found.   No diagnosis found.    MDM   Patient with a history of diabetes that is insulin-dependent but did not take her medications yesterday and woke up today with spasms. She took 8 units of NovoLog prior to coming in when she arrived here her blood sugar was 156. She is still complaining of muscle spasms for nausea and abdominal pain has resolved. Low suspicion for DKA today but will check labs and hydrate patient. She denies pregnancy or infection but will check for underlying issues. Feel her blood sugar was elevated because of noncompliance yesterday.  10:47 AM No signs of DKA. Labs relatively normal and the last blood sugar 113. On reevaluation the patient feeling much better. We'll discharge home.      Gwyneth Sprout, MD 10/20/11 1047

## 2011-10-20 NOTE — Discharge Instructions (Signed)
Diabetes, Frequently Asked Questions WHAT IS DIABETES? Most of the food we eat is turned into glucose (sugar). Our bodies use it for energy. The pancreas makes a hormone called insulin. It helps glucose get into the cells of our bodies. When you have diabetes, your body either does not make enough insulin or cannot use its own insulin as well as it should. This causes sugars to build up in your blood. WHAT ARE THE SYMPTOMS OF DIABETES?  Frequent urination.   Excessive thirst.   Unexplained weight loss.   Extreme hunger.   Blurred vision.   Tingling or numbness in hands or feet.   Feeling very tired much of the time.   Dry, itchy skin.   Sores that are slow to heal.   Yeast infections.  WHAT ARE THE TYPES OF DIABETES? Type 1 Diabetes   About 10% of affected people have this type.   Usually occurs before the age of 30.   Usually occurs in thin to normal weight people.  Type 2 Diabetes  About 90% of affected people have this type.   Usually occurs after the age of 40.   Usually occurs in overweight people.   More likely to have:   A family history of diabetes.   A history of diabetes during pregnancy (gestational diabetes).   High blood pressure.   High cholesterol and triglycerides.  Gestational Diabetes  Occurs in about 4% of pregnancies.   Usually goes away after the baby is born.   More likely to occur in women with:   Family history of diabetes.   Previous gestational diabetes.   Obese.   Over 25 years old.  WHAT IS PRE-DIABETES? Pre-diabetes means your blood glucose is higher than normal, but lower than the diabetes range. It also means you are at risk of getting type 2 diabetes and heart disease. If you are told you have pre-diabetes, have your blood glucose checked again in 1 to 2 years. WHAT IS THE TREATMENT FOR DIABETES? Treatment is aimed at keeping blood glucose near normal levels at all times. Learning how to manage this yourself is  important in treating diabetes. Depending on the type of diabetes you have, your treatment will include one or more of the following:  Monitoring your blood glucose.   Meal planning.   Exercise.   Oral medicine (pills) or insulin.  CAN DIABETES BE PREVENTED? With type 1 diabetes, prevention is more difficult, because the triggers that cause it are not yet known. With type 2 diabetes, prevention is more likely, with lifestyle changes:  Maintain a healthy weight.   Eat healthy.   Exercise.  IS THERE A CURE FOR DIABETES? No, there is no cure for diabetes. There is a lot of research going on that is looking for a cure, and progress is being made. Diabetes can be treated and controlled. People with diabetes can manage their diabetes and lead normal, active lives. SHOULD I BE TESTED FOR DIABETES? If you are at least 20 years old, you should be tested for diabetes. You should be tested again every 3 years. If you are 45 or older and overweight, you may want to get tested more often. If you are younger than 45, overweight, and have one or more of the following risk factors, you should be tested:  Family history of diabetes.   Inactive lifestyle.   High blood pressure.  WHAT ARE SOME OTHER SOURCES FOR INFORMATION ON DIABETES? The following organizations may help in your search for   more information on diabetes: National Diabetes Education Program (NDEP) Internet: http://www.ndep.nih.gov/resources American Diabetes Association Internet: http://www.diabetes.org  Juvenile Diabetes Foundation International Internet: http://www.jdf.org Document Released: 08/05/2003 Document Revised: 07/22/2011 Document Reviewed: 05/30/2009 ExitCare Patient Information 2012 [employer].Hyperglycemia Hyperglycemia occurs when the glucose (sugar) in your blood is too high. Hyperglycemia can happen for many reasons, but it most often happens to people who do not know they have diabetes or are not managing  their diabetes properly.  CAUSES  Whether you have diabetes or not, there are other causes of hyperglycemia. Hyperglycemia can occur when you have diabetes, but it can also occur in other situations that you might not be as aware of, such as: Diabetes  If you have diabetes and are having problems controlling your blood glucose, hyperglycemia could occur because of some of the following reasons:   Not following your meal plan.   Not taking your diabetes medications or not taking it properly.   Exercising less or doing less activity than you normally do.   Being sick.  Pre-diabetes  This cannot be ignored. Before people develop Type 2 diabetes, they almost always have "pre-diabetes." This is when your blood glucose levels are higher than normal, but not yet high enough to be diagnosed as diabetes. Research has shown that some long-term damage to the body, especially the heart and circulatory system, may already be occurring during pre-diabetes. If you take action to manage your blood glucose when you have pre-diabetes, you may delay or prevent Type 2 diabetes from developing.  Stress  If you have diabetes, you may be "diet" controlled or on oral medications or insulin to control your diabetes. However, you may find that your blood glucose is higher than usual in the hospital whether you have diabetes or not. This is often referred to as "stress hyperglycemia." Stress can elevate your blood glucose. This happens because of hormones put out by the body during times of stress. If stress has been the cause of your high blood glucose, it can be followed regularly by your caregiver. That way he/she can make sure your hyperglycemia does not continue to get worse or progress to diabetes.  Steroids  Steroids are medications that act on the infection fighting system (immune system) to block inflammation or infection. One side effect can be a rise in blood glucose. Most people can produce enough extra  insulin to allow for this rise, but for those who cannot, steroids make blood glucose levels go even higher. It is not unusual for steroid treatments to "uncover" diabetes that is developing. It is not always possible to determine if the hyperglycemia will go away after the steroids are stopped. A special blood test called an A1c is sometimes done to determine if your blood glucose was elevated before the steroids were started.  SYMPTOMS  Thirsty.   Frequent urination.   Dry mouth.   Blurred vision.   Tired or fatigue.   Weakness.   Sleepy.   Tingling in feet or leg.  DIAGNOSIS  Diagnosis is made by monitoring blood glucose in one or all of the following ways:  A1c test. This is a chemical found in your blood.   Fingerstick blood glucose monitoring.   Laboratory results.  TREATMENT  First, knowing the cause of the hyperglycemia is important before the hyperglycemia can be treated. Treatment may include, but is not be limited to:  Education.   Change or adjustment in medications.   Change or adjustment in meal plan.   Treatment for   an illness, infection, etc.   More frequent blood glucose monitoring.   Change in exercise plan.   Decreasing or stopping steroids.   Lifestyle changes.  HOME CARE INSTRUCTIONS   Test your blood glucose as directed.   Exercise regularly. Your caregiver will give you instructions about exercise. Pre-diabetes or diabetes which comes on with stress is helped by exercising.   Eat wholesome, balanced meals. Eat often and at regular, fixed times. Your caregiver or nutritionist will give you a meal plan to guide your sugar intake.   Being at an ideal weight is important. If needed, losing as little as 10 to 15 pounds may help improve blood glucose levels.  SEEK MEDICAL CARE IF:   You have questions about medicine, activity, or diet.   You continue to have symptoms (problems such as increased thirst, urination, or weight gain).  SEEK  IMMEDIATE MEDICAL CARE IF:   You are vomiting or have diarrhea.   Your breath smells fruity.   You are breathing faster or slower.   You are very sleepy or incoherent.   You have numbness, tingling, or pain in your feet or hands.   You have chest pain.   Your symptoms get worse even though you have been following your caregiver's orders.   If you have any other questions or concerns.  Document Released: 01/26/2001 Document Revised: 07/22/2011 Document Reviewed: 03/24/2009 ExitCare Patient Information 2012 [employer]. 

## 2011-10-26 ENCOUNTER — Inpatient Hospital Stay (HOSPITAL_COMMUNITY)
Admission: EM | Admit: 2011-10-26 | Discharge: 2011-10-28 | DRG: 639 | Disposition: A | Discharge status: Home / Self Care | Payer: Medicaid Other | Source: Ambulatory Visit | Attending: Internal Medicine | Admitting: Internal Medicine

## 2011-10-26 ENCOUNTER — Encounter (HOSPITAL_COMMUNITY): Payer: Self-pay | Admitting: Emergency Medicine

## 2011-10-26 DIAGNOSIS — E109 Type 1 diabetes mellitus without complications: Secondary | ICD-10-CM | POA: Diagnosis present

## 2011-10-26 DIAGNOSIS — E1010 Type 1 diabetes mellitus with ketoacidosis without coma: Principal | ICD-10-CM | POA: Diagnosis present

## 2011-10-26 DIAGNOSIS — E876 Hypokalemia: Secondary | ICD-10-CM | POA: Diagnosis present

## 2011-10-26 DIAGNOSIS — J069 Acute upper respiratory infection, unspecified: Secondary | ICD-10-CM | POA: Diagnosis present

## 2011-10-26 DIAGNOSIS — J309 Allergic rhinitis, unspecified: Secondary | ICD-10-CM | POA: Diagnosis present

## 2011-10-26 DIAGNOSIS — Z9119 Patient's noncompliance with other medical treatment and regimen: Secondary | ICD-10-CM

## 2011-10-26 DIAGNOSIS — Z91199 Patient's noncompliance with other medical treatment and regimen due to unspecified reason: Secondary | ICD-10-CM

## 2011-10-26 DIAGNOSIS — Z794 Long term (current) use of insulin: Secondary | ICD-10-CM

## 2011-10-26 DIAGNOSIS — E1110 Type 2 diabetes mellitus with ketoacidosis without coma: Secondary | ICD-10-CM | POA: Diagnosis present

## 2011-10-26 DIAGNOSIS — Z9114 Patient's other noncompliance with medication regimen: Secondary | ICD-10-CM

## 2011-10-26 LAB — BASIC METABOLIC PANEL
BUN: 8 mg/dL (ref 6–23)
CO2: 16 mEq/L — ABNORMAL LOW (ref 19–32)
Calcium: 9.7 mg/dL (ref 8.4–10.5)
Creatinine, Ser: 0.52 mg/dL (ref 0.50–1.10)
Creatinine, Ser: 0.49 mg/dL — ABNORMAL LOW (ref 0.50–1.10)
GFR calc Af Amer: >90 mL/min (ref >90)
GFR calc non Af Amer: >90 mL/min (ref >90)
GFR calc non Af Amer: >90 mL/min (ref >90)
Glucose, Bld: 434 mg/dL — ABNORMAL HIGH (ref 70–99)
Glucose, Bld: 241 mg/dL — ABNORMAL HIGH (ref 70–99)
Potassium: 4.1 mEq/L (ref 3.5–5.1)

## 2011-10-26 LAB — URINALYSIS, ROUTINE W REFLEX MICROSCOPIC
Glucose, UA: >1000 mg/dL — AB
Ketones, ur: >80 mg/dL — AB
Leukocytes, UA: NEGATIVE
Protein, ur: NEGATIVE mg/dL
Urobilinogen, UA: 0.2 mg/dL (ref 0.0–1.0)

## 2011-10-26 LAB — CBC
HCT: 35 % — ABNORMAL LOW (ref 36.0–46.0)
Hemoglobin: 11.4 g/dL — ABNORMAL LOW (ref 12.0–15.0)
MCH: 23.5 pg — ABNORMAL LOW (ref 26.0–34.0)
MCV: 73 fL — ABNORMAL LOW (ref 78.0–100.0)
MCV: 73.5 fL — ABNORMAL LOW (ref 78.0–100.0)
Platelets: 274 10*3/uL (ref 150–400)
RBC: 4.76 MIL/uL (ref 3.87–5.11)
RDW: 14.2 % (ref 11.5–15.5)
WBC: 6.4 10*3/uL (ref 4.0–10.5)

## 2011-10-26 LAB — URINE MICROSCOPIC-ADD ON

## 2011-10-26 LAB — DIFFERENTIAL
Basophils Absolute: 0 10*3/uL (ref 0.0–0.1)
Eosinophils Absolute: 0.1 10*3/uL (ref 0.0–0.7)
Lymphs Abs: 0.9 10*3/uL (ref 0.7–4.0)
Monocytes Absolute: 0.2 10*3/uL (ref 0.1–1.0)
Neutrophils Relative %: 81 % — ABNORMAL HIGH (ref 43–77)

## 2011-10-26 LAB — GLUCOSE, CAPILLARY: Glucose-Capillary: 506 mg/dL — ABNORMAL HIGH (ref 70–99)

## 2011-10-26 MED ORDER — ENOXAPARIN SODIUM 40 MG/0.4ML ~~LOC~~ SOLN
40.0000 mg | SUBCUTANEOUS | Status: DC
  Administered 2011-10-26: 40 mg via SUBCUTANEOUS
  Filled 2011-10-26 (×3): qty 0.4

## 2011-10-26 MED ORDER — SODIUM CHLORIDE 0.9 % IV BOLUS (SEPSIS)
1000.0000 mL | Freq: Once | INTRAVENOUS | Status: AC
  Administered 2011-10-26: 1000 mL via INTRAVENOUS

## 2011-10-26 MED ORDER — SODIUM CHLORIDE 0.9 % IV SOLN: 1000.0000 mL | Freq: Once | INTRAVENOUS | Status: DC

## 2011-10-26 MED ORDER — DEXTROSE-NACL 5-0.45 % IV SOLN: INTRAVENOUS | Status: DC

## 2011-10-26 MED ORDER — SODIUM CHLORIDE 0.9 % IV SOLN: INTRAVENOUS | Status: DC

## 2011-10-26 MED ORDER — LORATADINE 10 MG PO TABS
10.0000 mg | ORAL_TABLET | Freq: Every day | ORAL | Status: DC
  Administered 2011-10-26 – 2011-10-28 (×3): 10 mg via ORAL
  Filled 2011-10-26 (×3): qty 1

## 2011-10-26 MED ORDER — DEXTROSE 50 % IV SOLN: 25.0000 mL | INTRAVENOUS | Status: DC | PRN

## 2011-10-26 MED ORDER — POTASSIUM CHLORIDE 10 MEQ/100ML IV SOLN: 10.0000 meq | INTRAVENOUS | Status: AC

## 2011-10-26 MED ORDER — DEXTROSE-NACL 5-0.45 % IV SOLN
INTRAVENOUS | Status: DC
  Administered 2011-10-27: 125 mL/h via INTRAVENOUS
  Administered 2011-10-27: 09:00:00 via INTRAVENOUS

## 2011-10-26 MED ORDER — SODIUM CHLORIDE 0.9 % IV SOLN
INTRAVENOUS | Status: AC
  Administered 2011-10-26: 2.7 [IU]/h via INTRAVENOUS
  Filled 2011-10-26: qty 1

## 2011-10-26 MED ORDER — SODIUM CHLORIDE 0.9 % IV SOLN: 1000.0000 mL | INTRAVENOUS | Status: DC

## 2011-10-26 MED ORDER — MONTELUKAST SODIUM 10 MG PO TABS
10.0000 mg | ORAL_TABLET | Freq: Every day | ORAL | Status: DC
  Administered 2011-10-26 – 2011-10-28 (×3): 10 mg via ORAL
  Filled 2011-10-26 (×3): qty 1

## 2011-10-26 MED ORDER — SODIUM CHLORIDE 0.9 % IV SOLN
INTRAVENOUS | Status: DC
  Filled 2011-10-26: qty 1

## 2011-10-26 NOTE — ED Notes (Signed)
Pt CBG is 506. RN aware.

## 2011-10-26 NOTE — ED Notes (Signed)
Nurse unable to take report at this time.

## 2011-10-26 NOTE — ED Notes (Signed)
Pt sts did not take her insulin x 2 days because she left it at home and was not at home; pt sts feels "bad" and lethargic

## 2011-10-26 NOTE — ED Notes (Signed)
3309-01 Ready

## 2011-10-26 NOTE — H&P (Addendum)
History and Physical Examination  Date: 10/26/2011  Patient name: Beverly Wells Medical record number: [id number] Date of birth: 1990-10-11 Age: 21 y.o. Gender: female PCP: Romero Belling, MD, MD  Attending physician: Cleora Fleet, MD  Chief Complaint:  Chief Complaint  Patient presents with  . Hyperglycemia     History of Present Illness: Beverly Wells is an 21 y.o. female with type 1 diabetes mellitus diagnosed at age 51 presented to the emergency department today complaining of hyperglycemia and fatigue.  The patient also reports that she has had a cold for the past several days as well.  She had recently visited an urgent care with some abdominal pain and was sent home after tests confirm that she was hyperglycemic but did not have DKA at the time.  Now she presents with fatigue, malaise, dry mouth, occasional abdominal pain but no nausea or vomiting.  The patient has a long history of poor compliance with taking her insulin and following the recommendations of her endocrinologist.  She tells me that she does not test her blood glucose.  Because of this she has been taking Levemir 80 mg daily.  She does not take bolus insulin.  This is mostly because she does not test her blood glucose.  She reports that she does not have hypoglycemia episodes.  She was frequently hyperglycemic.  The patient says that she would like to count carbohydrates to cover meals but she still not willing to test her blood glucose at this time.  She does not have any known complications of diabetes at this time.  She reports that she was hospitalized for DKA approximately one year ago.  Prior to that she had not been hospitalized for 4 years.  In the emergency room she was found to be hyperglycemic and had an elevated anion gap.  The patient was started on IV insulin and IV fluids.  Hospital admission was requested for further evaluation and management.  Past Medical History Past Medical History  Diagnosis  Date  . 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 ALLERGIC RHINITIS 05/07/2010    Past Surgical History History reviewed. No pertinent past surgical history.  Home Meds: Prior to Admission medications   Medication Sig Start Date End Date Taking? Authorizing Provider  cetirizine (ZYRTEC) 10 MG tablet Take 10 mg by mouth daily.     Yes Historical Provider, MD  insulin detemir (LEVEMIR) 100 UNIT/ML injection Inject 90 Units into the skin every morning.     Yes Historical Provider, MD  montelukast (SINGULAIR) 10 MG tablet Take 10 mg by mouth daily. Once daily   Yes Historical Provider, MD    Allergies: Penicillins  Social History:  History   Social History  . Marital Status: Single    Spouse Name: N/A    Number of Children: N/A  . Years of Education: N/A   Occupational History  . Not on file.   Social History Main Topics  . Smoking status: Never Smoker   . Smokeless tobacco: Not on file  . Alcohol Use: No  . Drug Use: No  . Sexually Active: Not on file   Other Topics Concern  . 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   Family History:  Family History  Problem Relation Age of Onset  . Diabetes Neg Hx     Review of Systems: Pertinent items are noted in HPI. All  other systems reviewed and reported as negative.   Physical Exam: Blood pressure 120/63, pulse 103, temperature 98.1 F (36.7 C), temperature source Oral, resp. rate 16, last menstrual period 09/22/2011, SpO2 99.00%. General exam-well-developed well-nourished female, lying in bed she is awake she is alert she is in no distress at this time.  Cooperative HEENT normal cephalic atraumatic, sclera clear, mucous membranes moist, nares clear Lungs bilateral breath sounds clear auscultation no crackles wheezes or rhonchi heard CV-normal S1-S2 sounds without murmur Abdomen-soft nondistended nontender no masses palpated no  lipohypertrophy seen Extremities no clubbing cyanosis or edema noted Neuro no focal deficits  Lab  And Imaging results:  Results for orders placed during the hospital encounter of 10/26/11 (from the past 24 hour(s))  GLUCOSE, CAPILLARY     Status: Abnormal   Collection Time   10/26/11  4:40 PM      Component Value Range   Glucose-Capillary 506 (*) 70 - 99 (mg/dL)   Comment 1 Notify RN    BASIC METABOLIC PANEL     Status: Abnormal   Collection Time   10/26/11  5:26 PM      Component Value Range   Sodium 133 (*) 135 - 145 (mEq/L)   Potassium 4.6  3.5 - 5.1 (mEq/L)   Chloride 97  96 - 112 (mEq/L)   CO2 16 (*) 19 - 32 (mEq/L)   Glucose, Bld 434 (*) 70 - 99 (mg/dL)   BUN 11  6 - 23 (mg/dL)   Creatinine, Ser 0.86  0.50 - 1.10 (mg/dL)   Calcium 9.7  8.4 - 57.8 (mg/dL)   GFR calc non Af Amer >90  >90 (mL/min)   GFR calc Af Amer >90  >90 (mL/min)  CBC     Status: Abnormal   Collection Time   10/26/11  5:26 PM      Component Value Range   WBC 5.7  4.0 - 10.5 (K/uL)   RBC 5.23 (*) 3.87 - 5.11 (MIL/uL)   Hemoglobin 12.3  12.0 - 15.0 (g/dL)   HCT 46.9  62.9 - 52.8 (%)   MCV 73.0 (*) 78.0 - 100.0 (fL)   MCH 23.5 (*) 26.0 - 34.0 (pg)   MCHC 32.2  30.0 - 36.0 (g/dL)   RDW 41.3  24.4 - 01.0 (%)   Platelets 274  150 - 400 (K/uL)  DIFFERENTIAL     Status: Abnormal   Collection Time   10/26/11  5:26 PM      Component Value Range   Neutrophils Relative 81 (*) 43 - 77 (%)   Lymphocytes Relative 15  12 - 46 (%)   Monocytes Relative 3  3 - 12 (%)   Eosinophils Relative 1  0 - 5 (%)   Basophils Relative 0  0 - 1 (%)   Neutro Abs 4.5  1.7 - 7.7 (K/uL)   Lymphs Abs 0.9  0.7 - 4.0 (K/uL)   Monocytes Absolute 0.2  0.1 - 1.0 (K/uL)   Eosinophils Absolute 0.1  0.0 - 0.7 (K/uL)   Basophils Absolute 0.0  0.0 - 0.1 (K/uL)  URINALYSIS, ROUTINE W REFLEX MICROSCOPIC     Status: Abnormal   Collection Time   10/26/11  6:50 PM      Component Value Range   Color, Urine YELLOW  YELLOW    APPearance HAZY  (*) CLEAR    Specific Gravity, Urine 1.035 (*) 1.005 - 1.030    pH 5.5  5.0 - 8.0    Glucose, UA >1000 (*) NEGATIVE (  mg/dL)   Hgb urine dipstick LARGE (*) NEGATIVE    Bilirubin Urine NEGATIVE  NEGATIVE    Ketones, ur >80 (*) NEGATIVE (mg/dL)   Protein, ur NEGATIVE  NEGATIVE (mg/dL)   Urobilinogen, UA 0.2  0.0 - 1.0 (mg/dL)   Nitrite NEGATIVE  NEGATIVE    Leukocytes, UA NEGATIVE  NEGATIVE   URINE MICROSCOPIC-ADD ON     Status: Normal   Collection Time   10/26/11  6:50 PM      Component Value Range   Squamous Epithelial / LPF RARE  RARE    WBC, UA 0-2  <3 (WBC/hpf)   RBC / HPF TOO NUMEROUS TO COUNT  <3 (RBC/hpf)   Bacteria, UA RARE  RARE   GLUCOSE, CAPILLARY     Status: Abnormal   Collection Time   10/26/11  6:53 PM      Component Value Range   Glucose-Capillary 326 (*) 70 - 99 (mg/dL)   Comment 1 Documented in Chart     Comment 2 Notify RN     EKG Results:  No orders found for this or any previous visit.   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  IV fluids recommended for aggressive treatment and monitoring.  The patient n.p.o. for now.  Please see orders and followup.  Consultation with the social worker and diabetes coordinators will be requested.  50 MINS spent in critical care assessment and  management for this admission.   Standley Dakins MD [facility] [place], [place] [phone] 10/26/2011, 7:45 PM

## 2011-10-26 NOTE — ED Notes (Signed)
[id number] 

## 2011-10-26 NOTE — ED Notes (Signed)
Pt transported on monitor with RN stable and in no acute distress at this time.

## 2011-10-26 NOTE — Progress Notes (Signed)
RN called NP because pt is wanting to leave AMA. NP to bedside. Chart reviewed including labs, anion gap, sugars, etc.   Pt says she is leaving b/c she feels fine and has to go to school tomorrow. I explained to her that she probably does feel fine now that her sugars are lower, but it doesn't all have to do with her sugars. I explained it has to do with the "pH balance" and she is in acidosis now and needs to stay until that is corrected. She is basically laughing throughout this entire conversation. I explained to her the risks of leaving including diabetic coma and death.  She is to decide about leaving in the next few minutes.  Maren Reamer, NP [facility]

## 2011-10-26 NOTE — ED Notes (Signed)
Consult at bedside.

## 2011-10-27 LAB — GLUCOSE, CAPILLARY
Glucose-Capillary: 169 mg/dL — ABNORMAL HIGH (ref 70–99)
Glucose-Capillary: 221 mg/dL — ABNORMAL HIGH (ref 70–99)
Glucose-Capillary: 256 mg/dL — ABNORMAL HIGH (ref 70–99)
Glucose-Capillary: 388 mg/dL — ABNORMAL HIGH (ref 70–99)

## 2011-10-27 LAB — COMPREHENSIVE METABOLIC PANEL
AST: 13 U/L (ref 0–37)
Albumin: 3.2 g/dL — ABNORMAL LOW (ref 3.5–5.2)
Chloride: 106 mEq/L (ref 96–112)
Creatinine, Ser: 0.43 mg/dL — ABNORMAL LOW (ref 0.50–1.10)
GFR calc Af Amer: >90 mL/min (ref >90)
Total Bilirubin: 0.2 mg/dL — ABNORMAL LOW (ref 0.3–1.2)
Total Protein: 6.3 g/dL (ref 6.0–8.3)

## 2011-10-27 LAB — BASIC METABOLIC PANEL
BUN: 8 mg/dL (ref 6–23)
CO2: 17 mEq/L — ABNORMAL LOW (ref 19–32)
Calcium: 9.2 mg/dL (ref 8.4–10.5)
Chloride: 106 mEq/L (ref 96–112)
Chloride: 106 mEq/L (ref 96–112)
Chloride: 99 mEq/L (ref 96–112)
Creatinine, Ser: 0.43 mg/dL — ABNORMAL LOW (ref 0.50–1.10)
Creatinine, Ser: 0.48 mg/dL — ABNORMAL LOW (ref 0.50–1.10)
GFR calc Af Amer: >90 mL/min (ref >90)
GFR calc Af Amer: >90 mL/min (ref >90)
GFR calc non Af Amer: >90 mL/min (ref >90)
Glucose, Bld: 190 mg/dL — ABNORMAL HIGH (ref 70–99)
Potassium: 3.5 mEq/L (ref 3.5–5.1)
Potassium: 3.7 mEq/L (ref 3.5–5.1)
Potassium: 4.1 mEq/L (ref 3.5–5.1)
Sodium: 134 mEq/L — ABNORMAL LOW (ref 135–145)
Sodium: 131 mEq/L — ABNORMAL LOW (ref 135–145)

## 2011-10-27 LAB — TSH: TSH: 0.49 u[IU]/mL (ref 0.350–4.500)

## 2011-10-27 LAB — MAGNESIUM: Magnesium: 1.9 mg/dL (ref 1.5–2.5)

## 2011-10-27 MED ORDER — INSULIN GLARGINE 100 UNIT/ML ~~LOC~~ SOLN
5.0000 [IU] | Freq: Every day | SUBCUTANEOUS | Status: DC
  Administered 2011-10-27: 5 [IU] via SUBCUTANEOUS

## 2011-10-27 MED ORDER — POTASSIUM CHLORIDE CRYS ER 20 MEQ PO TBCR
30.0000 meq | EXTENDED_RELEASE_TABLET | Freq: Once | ORAL | Status: AC
  Administered 2011-10-27: 30 meq via ORAL
  Filled 2011-10-27: qty 1

## 2011-10-27 MED ORDER — INSULIN ASPART 100 UNIT/ML ~~LOC~~ SOLN
5.0000 [IU] | Freq: Three times a day (TID) | SUBCUTANEOUS | Status: DC
  Administered 2011-10-27 – 2011-10-28 (×2): 5 [IU] via SUBCUTANEOUS

## 2011-10-27 MED ORDER — POTASSIUM CHLORIDE CRYS ER 20 MEQ PO TBCR
40.0000 meq | EXTENDED_RELEASE_TABLET | Freq: Two times a day (BID) | ORAL | Status: DC
  Administered 2011-10-27 – 2011-10-28 (×2): 40 meq via ORAL
  Filled 2011-10-27 (×3): qty 2

## 2011-10-27 MED ORDER — INSULIN ASPART 100 UNIT/ML ~~LOC~~ SOLN
0.0000 [IU] | Freq: Three times a day (TID) | SUBCUTANEOUS | Status: DC
  Administered 2011-10-27: 15 [IU] via SUBCUTANEOUS
  Administered 2011-10-28: 3 [IU] via SUBCUTANEOUS

## 2011-10-27 MED ORDER — INSULIN ASPART 100 UNIT/ML ~~LOC~~ SOLN: 0.0000 [IU] | Freq: Every day | SUBCUTANEOUS | Status: DC

## 2011-10-27 MED ORDER — INSULIN ASPART 100 UNIT/ML ~~LOC~~ SOLN
0.0000 [IU] | Freq: Three times a day (TID) | SUBCUTANEOUS | Status: DC
  Administered 2011-10-27 (×2): 3 [IU] via SUBCUTANEOUS

## 2011-10-27 MED ORDER — INSULIN GLARGINE 100 UNIT/ML ~~LOC~~ SOLN
30.0000 [IU] | Freq: Every day | SUBCUTANEOUS | Status: DC
  Administered 2011-10-27: 30 [IU] via SUBCUTANEOUS

## 2011-10-27 MED ORDER — LIVING WELL WITH DIABETES BOOK
Freq: Once | Status: DC
  Filled 2011-10-27: qty 1

## 2011-10-27 MED ORDER — INSULIN ASPART 100 UNIT/ML ~~LOC~~ SOLN
0.0000 [IU] | Freq: Every day | SUBCUTANEOUS | Status: DC
  Administered 2011-10-27: 2 [IU] via SUBCUTANEOUS

## 2011-10-27 NOTE — Progress Notes (Signed)
Pt TX to [id number], VSS, called report.

## 2011-10-27 NOTE — ED Provider Notes (Signed)
History     CSN: [id number]  Arrival date & time 10/26/11  1629   First MD Initiated Contact with Patient 10/26/11 1718      Chief Complaint  Patient presents with  . Hyperglycemia    HPI Pt sts did not take her insulin x 2 days because she left it at home and was not at home; pt sts feels "bad" and lethargic.  No fever, chills.  No diarrhea.  Nausea and vomiting present.  Pt also complains of polyuria and increased thirst  Past Medical History  Diagnosis Date  . 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 ALLERGIC RHINITIS 05/07/2010    History reviewed. No pertinent past surgical history.  Family History  Problem Relation Age of Onset  . Diabetes Neg Hx     History  Substance Use Topics  . Smoking status: Never Smoker   . Smokeless tobacco: Not on file  . Alcohol Use: No    OB History    Grav Para Term Preterm Abortions TAB SAB Ect Mult Living                  Review of Systems  Unable to perform ROS: Other    Allergies  Penicillins  Home Medications  No current outpatient prescriptions on file.  BP 112/78  Pulse 96  Temp(Src) 97.6 F (36.4 C) (Oral)  Resp 18  Ht 5\' 7"  (1.702 m)  Wt 144 lb 10 oz (65.6 kg)  BMI 22.65 kg/m2  SpO2 99%  LMP 10/25/2011  Physical Exam  Nursing note and vitals reviewed. Constitutional: She is oriented to person, place, and time. She appears well-developed and well-nourished. No distress.  HENT:  Head: Normocephalic and atraumatic.  Mouth/Throat: Uvula is midline. Mucous membranes are dry and not cyanotic. No oropharyngeal exudate or posterior oropharyngeal edema.  Eyes: Conjunctivae are normal. Pupils are equal, round, and reactive to light.  Neck: Normal range of motion.  Cardiovascular: Normal rate and intact distal pulses.   Pulmonary/Chest: No respiratory distress. She has no wheezes. She has no rales.  Abdominal: Normal appearance. She exhibits no distension. There is no  tenderness. There is no rebound.  Musculoskeletal: Normal range of motion.  Neurological: She is alert and oriented to person, place, and time. No cranial nerve deficit.  Skin: Skin is warm and dry. No rash noted.  Psychiatric: She has a normal mood and affect. Her behavior is normal.    ED Course  Procedures (including critical care time) Scheduled Meds:   . enoxaparin  40 mg Subcutaneous Q24H  . insulin aspart  0-15 Units Subcutaneous TID WC  . insulin aspart  0-5 Units Subcutaneous QHS  . insulin aspart  5 Units Subcutaneous TID WC  . insulin glargine  30 Units Subcutaneous QHS  . living well with diabetes book   Does not apply Once  . loratadine  10 mg Oral Daily  . montelukast  10 mg Oral Daily  . potassium chloride  30 mEq Oral Once  . potassium chloride  10 mEq Intravenous Q1H  . potassium chloride  40 mEq Oral BID  . DISCONTD: insulin aspart  0-5 Units Subcutaneous QHS  . DISCONTD: insulin aspart  0-9 Units Subcutaneous TID WC  . DISCONTD: insulin glargine  5 Units Subcutaneous QHS   Continuous Infusions:   . DISCONTD: sodium chloride    . DISCONTD: dextrose 5 % and 0.45% NaCl 125 mL/hr at 10/27/11 0903  . DISCONTD:  insulin (NOVOLIN-R) infusion 1.9 Units/hr (10/27/11 0238)   PRN Meds:.dextrose   CRITICAL CARE Performed by: Nelva Nay L   Total critical care time: 30 min  Critical care time was exclusive of separately billable procedures and treating other patients.  Critical care was necessary to treat or prevent imminent or life-threatening deterioration.  Critical care was time spent personally by me on the following activities: development of treatment plan with patient and/or surrogate as well as nursing, discussions with consultants, evaluation of patient's response to treatment, examination of patient, obtaining history from patient or surrogate, ordering and performing treatments and interventions, ordering and review of laboratory studies, ordering and  review of radiographic studies, pulse oximetry and re-evaluation of patient's condition.  Labs Reviewed  GLUCOSE, CAPILLARY - Abnormal; Notable for the following:    Glucose-Capillary 506 (*)    All other components within normal limits  URINALYSIS, ROUTINE W REFLEX MICROSCOPIC - Abnormal; Notable for the following:    APPearance HAZY (*)    Specific Gravity, Urine 1.035 (*)    Glucose, UA >1000 (*)    Hgb urine dipstick LARGE (*)    Ketones, ur >80 (*)    All other components within normal limits  BASIC METABOLIC PANEL - Abnormal; Notable for the following:    Sodium 133 (*)    CO2 16 (*)    Glucose, Bld 434 (*)    All other components within normal limits  CBC - Abnormal; Notable for the following:    RBC 5.23 (*)    MCV 73.0 (*)    MCH 23.5 (*)    All other components within normal limits  DIFFERENTIAL - Abnormal; Notable for the following:    Neutrophils Relative 81 (*)    All other components within normal limits  GLUCOSE, CAPILLARY - Abnormal; Notable for the following:    Glucose-Capillary 326 (*)    All other components within normal limits  COMPREHENSIVE METABOLIC PANEL - Abnormal; Notable for the following:    Sodium 134 (*)    Potassium 3.3 (*)    Glucose, Bld 127 (*)    Creatinine, Ser 0.43 (*)    Albumin 3.2 (*)    Total Bilirubin 0.2 (*)    All other components within normal limits  GLUCOSE, CAPILLARY - Abnormal; Notable for the following:    Glucose-Capillary 220 (*)    All other components within normal limits  BASIC METABOLIC PANEL - Abnormal; Notable for the following:    Sodium 134 (*)    CO2 17 (*)    Glucose, Bld 241 (*)    Creatinine, Ser 0.49 (*)    All other components within normal limits  BASIC METABOLIC PANEL - Abnormal; Notable for the following:    Glucose, Bld 190 (*)    Creatinine, Ser 0.49 (*)    All other components within normal limits  BASIC METABOLIC PANEL - Abnormal; Notable for the following:    Sodium 134 (*)    CO2 17 (*)     Glucose, Bld 126 (*)    Creatinine, Ser 0.41 (*)    All other components within normal limits  BASIC METABOLIC PANEL - Abnormal; Notable for the following:    Sodium 134 (*)    CO2 17 (*)    Glucose, Bld 146 (*)    Creatinine, Ser 0.43 (*)    All other components within normal limits  CBC - Abnormal; Notable for the following:    Hemoglobin 11.4 (*)    HCT 35.0 (*)  MCV 73.5 (*)    MCH 23.9 (*)    All other components within normal limits  HEMOGLOBIN A1C - Abnormal; Notable for the following:    Hemoglobin A1C 11.7 (*)    Mean Plasma Glucose 289 (*)    All other components within normal limits  GLUCOSE, CAPILLARY - Abnormal; Notable for the following:    Glucose-Capillary 256 (*)    All other components within normal limits  GLUCOSE, CAPILLARY - Abnormal; Notable for the following:    Glucose-Capillary 221 (*)    All other components within normal limits  GLUCOSE, CAPILLARY - Abnormal; Notable for the following:    Glucose-Capillary 169 (*)    All other components within normal limits  GLUCOSE, CAPILLARY - Abnormal; Notable for the following:    Glucose-Capillary 153 (*)    All other components within normal limits  GLUCOSE, CAPILLARY - Abnormal; Notable for the following:    Glucose-Capillary 127 (*)    All other components within normal limits  GLUCOSE, CAPILLARY - Abnormal; Notable for the following:    Glucose-Capillary 157 (*)    All other components within normal limits  GLUCOSE, CAPILLARY - Abnormal; Notable for the following:    Glucose-Capillary 154 (*)    All other components within normal limits  GLUCOSE, CAPILLARY - Abnormal; Notable for the following:    Glucose-Capillary 150 (*)    All other components within normal limits  GLUCOSE, CAPILLARY - Abnormal; Notable for the following:    Glucose-Capillary 237 (*)    All other components within normal limits  GLUCOSE, CAPILLARY - Abnormal; Notable for the following:    Glucose-Capillary 225 (*)    All other  components within normal limits  BASIC METABOLIC PANEL - Abnormal; Notable for the following:    Sodium 131 (*)    Glucose, Bld 328 (*)    Creatinine, Ser 0.48 (*)    All other components within normal limits  GLUCOSE, CAPILLARY - Abnormal; Notable for the following:    Glucose-Capillary 388 (*)    All other components within normal limits  URINE MICROSCOPIC-ADD ON  MRSA PCR SCREENING  TSH  MAGNESIUM  BASIC METABOLIC PANEL   No results found.   1. Diabetic ketoacidosis   2. Noncompliance with medication regimen       MDM         Nelia Shi, MD 10/27/11 2231

## 2011-10-27 NOTE — Progress Notes (Signed)
Utilization Review Completed.Beverly Wells T3/13/2013   

## 2011-10-27 NOTE — Progress Notes (Signed)
Referral received.  Patient was diagnosed with Type 1 diabetes at age 21.  She was a patient of Dr. Juluis Mire (pediatric endocrinologist) previously and now see's Dr. Everardo All.  She states she was on one shot a day of Levemir 80 units daily.  A1c pending.  She had intense education with pediatric endocrinologist when diagnosed but would likely benefit from re-education with CDE.  She states that previously she counted carbohydrates and covered 2 units for every 15 grams of CHO. Consider basal/bolus regimen at discharge?  Will reassess on 10/28/11.  Discussed importance of checking CBG's, taking insulin as prescribed, and following up with MD.

## 2011-10-27 NOTE — Progress Notes (Signed)
[facility] [place] TEAM 1 - Stepdown/ICU TEAM  Subjective: 21 y.o. female with type 1 diabetes mellitus diagnosed at age 27 presented to the emergency department complaining of hyperglycemia and fatigue.  The patient has a long history of poor compliance with taking her insulin and following the recommendations of her endocrinologist.  Pt has no complaints today.  She denies f/c, sob, n/v, or abdom pain.  She is ancious to go home.  I have discussed DM and the danger of death from DKA as well as the risk of serious long term complications if DM remains uncontrolled.  She voices understanding, and admits that she will need to do a better job of controlling her DM.  Objective: Weight change:   Intake/Output Summary (Last 24 hours) at 10/27/11 1458 Last data filed at 10/27/11 0701  Gross per 24 hour  Intake 1202.08 ml  Output      0 ml  Net 1202.08 ml   Blood pressure 104/42, pulse 83, temperature 98.6 F (37 C), temperature source Oral, resp. rate 18, height 5\' 7"  (1.702 m), weight 65.6 kg (144 lb 10 oz), last menstrual period 10/25/2011, SpO2 100.00%.  CBG (last 3)   Basename 10/27/11 1148 10/27/11 0809 10/27/11 0440  GLUCAP 225* 237* 150*   Physical Exam: General: No acute respiratory distress Lungs: Clear to auscultation bilaterally without wheezes or crackles Cardiovascular: Regular rate and rhythm without murmur gallop or rub  Abdomen: Nontender, nondistended, soft, bowel sounds positive, no rebound, no ascites, no appreciable mass Extremities: No significant cyanosis, clubbing, or edema bilateral lower extremities  Lab Results:  Basename 10/27/11 0520 10/27/11 0338 10/27/11 0154  NA 134* 134* 134*  K 3.3* 3.5 3.7  CL 106 106 106  CO2 19 17* 17*  GLUCOSE 127* 146* 126*  BUN 8 8 8   CREATININE 0.43* 0.43* 0.41*  CALCIUM 8.6 8.6 8.6  MG 1.9 -- --  PHOS -- -- --    Basename 10/27/11 0520  AST 13  ALT 8  ALKPHOS 67  BILITOT 0.2*  PROT 6.3  ALBUMIN 3.2*      Basename 10/26/11 2135 10/26/11 1726  WBC 6.4 5.7  NEUTROABS -- 4.5  HGB 11.4* 12.3  HCT 35.0* 38.2  MCV 73.5* 73.0*  PLT 264 274   Micro Results: Recent Results (from the past 240 hour(s))  MRSA PCR SCREENING     Status: Normal   Collection Time   10/26/11 10:26 PM      Component Value Range Status Comment   MRSA by PCR NEGATIVE  NEGATIVE  Final     Studies/Results: All recent x-ray/radiology reports have been reviewed in detail.   Medications: I have reviewed the patient's complete medication list.  Assessment/Plan:  DKA Now resolved - off insulin gtt - will recheck BMET this evening and in the morning to assure her bicarb remains stable  Uncontrolled DM1  I have counseled the pt as noted above - the DM coordinator has also spoken with her - I feel it is important to assure that her CBG is well controlled before discharging her home  Noncompliance Discussed in detail as above   Hypokalemia Due to tx of DKA - supplement and follow  Dispo Stable for transfer to medical bed  Lonia Blood, MD [facility] Office  [phone] Pager [phone]  On-Call/Text Page:      [email or web address]      password [facility]

## 2011-10-27 NOTE — Progress Notes (Signed)
1850 Pt. Arrived to floor from 3300 made aware of surroundings.Pt is nontelemetry.

## 2011-10-28 ENCOUNTER — Encounter (HOSPITAL_COMMUNITY): Payer: Self-pay | Admitting: *Deleted

## 2011-10-28 LAB — BASIC METABOLIC PANEL
CO2: 22 mEq/L (ref 19–32)
Calcium: 8.7 mg/dL (ref 8.4–10.5)
GFR calc non Af Amer: >90 mL/min (ref >90)
Sodium: 138 mEq/L (ref 135–145)

## 2011-10-28 MED ORDER — UNABLE TO FIND: Status: DC

## 2011-10-28 MED ORDER — INSULIN ASPART 100 UNIT/ML ~~LOC~~ SOLN: 5.0000 [IU] | Freq: Three times a day (TID) | SUBCUTANEOUS | Status: DC

## 2011-10-28 MED ORDER — INSULIN GLARGINE 100 UNIT/ML ~~LOC~~ SOLN: 35.0000 [IU] | Freq: Every day | SUBCUTANEOUS | Status: DC

## 2011-10-28 MED ORDER — INSULIN ASPART 100 UNIT/ML ~~LOC~~ SOLN: SUBCUTANEOUS | Status: DC

## 2011-10-28 NOTE — Discharge Summary (Signed)
PATIENT DETAILS Name: Beverly Wells Age: 21 y.o. Sex: female Date of Birth: 1990/12/24 MRN: [id number]. Admit Date: 10/26/2011 Admitting Physician: Cleora Fleet, MD [id number], Gregary Signs, MD, MD  PRIMARY DISCHARGE DIAGNOSIS:  Principal Problem:  *DKA (diabetic ketoacidoses) Active Problems:  IDDM  ALLERGIC RHINITIS  PERS HX NONCOMPLIANCE W/MED TX PRS HAZARDS HLTH  URI (upper respiratory infection)      PAST MEDICAL HISTORY: Past Medical History  Diagnosis Date  . 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 ALLERGIC RHINITIS 05/07/2010    DISCHARGE MEDICATIONS: Medication List  As of 10/28/2011 10:53 AM   STOP taking these medications         LEVEMIR 100 UNIT/ML injection         TAKE these medications         cetirizine 10 MG tablet   Commonly known as: ZYRTEC   Take 10 mg by mouth daily.      insulin aspart 100 UNIT/ML injection   Commonly known as: novoLOG   Inject 5 Units into the skin 3 (three) times daily before meals.      insulin glargine 100 UNIT/ML injection   Commonly known as: LANTUS   Inject 35 Units into the skin at bedtime.      montelukast 10 MG tablet   Commonly known as: SINGULAIR   Take 10 mg by mouth daily. Once daily             BRIEF HPI:  See H&P, Labs, Consult and Test reports for all details in brief, 21 y.o. female with type 1 diabetes mellitus diagnosed at age 38 presented to the emergency department complaining of hyperglycemia and fatigue. The patient has a long history of poor compliance with taking her insulin and following the recommendations of her endocrinologist.  CONSULTATIONS:   None  PERTINENT RADIOLOGIC STUDIES: No results found.   PERTINENT LAB RESULTS: CBC:  Basename 10/26/11 2135 10/26/11 1726  WBC 6.4 5.7  HGB 11.4* 12.3  HCT 35.0* 38.2  PLT 264 274   CMET CMP     Component Value Date/Time   NA 138 10/28/2011 0540   K 3.7 10/28/2011 0540   CL 109 10/28/2011  0540   CO2 22 10/28/2011 0540   GLUCOSE 108* 10/28/2011 0540   BUN 12 10/28/2011 0540   CREATININE 0.58 10/28/2011 0540   CALCIUM 8.7 10/28/2011 0540   PROT 6.3 10/27/2011 0520   ALBUMIN 3.2* 10/27/2011 0520   AST 13 10/27/2011 0520   ALT 8 10/27/2011 0520   ALKPHOS 67 10/27/2011 0520   BILITOT 0.2* 10/27/2011 0520   GFRNONAA >90 10/28/2011 0540   GFRAA >90 10/28/2011 0540    GFR Estimated Creatinine Clearance: 110 ml/min (by C-G formula based on Cr of 0.58). No results found for this basename: LIPASE:2,AMYLASE:2 in the last 72 hours No results found for this basename: CKTOTAL:3,CKMB:3,CKMBINDEX:3,TROPONINI:3 in the last 72 hours No components found with this basename: POCBNP:3 No results found for this basename: DDIMER:2 in the last 72 hours  Basename 10/27/11 0520  HGBA1C 11.7*   No results found for this basename: CHOL:2,HDL:2,LDLCALC:2,TRIG:2,CHOLHDL:2,LDLDIRECT:2 in the last 72 hours  Basename 10/27/11 0520  TSH 0.490  T4TOTAL --  T3FREE --  THYROIDAB --   No results found for this basename: VITAMINB12:2,FOLATE:2,FERRITIN:2,TIBC:2,IRON:2,RETICCTPCT:2 in the last 72 hours Coags: No results found for this basename: PT:2,INR:2 in the last 72 hours Microbiology: Recent Results (from the past 240 hour(s))  MRSA PCR SCREENING  Status: Normal   Collection Time   10/26/11 10:26 PM      Component Value Range Status Comment   MRSA by PCR NEGATIVE  NEGATIVE  Final      BRIEF HOSPITAL COURSE:   Principal Problem:  *DKA (diabetic ketoacidoses) Placed on Insulin gtt per glucose-stabilizer protocol, once DKA resolved started on Lantus, currently CBG's stable with just 30 units of Lantus and Novolog 5 units with meals, patient will be discharged home today, she has made an appointment with Dr Everardo All for 11/01/11 which she has been encouraged to keep. I have had a long discussion with her and have counseled her extensively about the importance of good glycemic control and need for  complaince of medications, I have also discussed the life threatening complications of noncompliance. She claims to understand. I will defer further optimization of her Insulin regimen to Dr Everardo All when patient follows up with him next Monday    TODAY-DAY OF DISCHARGE:  Subjective:   Beverly Wells today has no headache,no chest abdominal pain,no new weakness tingling or numbness, feels much better wants to go home today.   Objective:   Blood pressure 113/64, pulse 77, temperature 98.2 F (36.8 C), temperature source Oral, resp. rate 18, height 5\' 7"  (1.702 m), weight 65.6 kg (144 lb 10 oz), last menstrual period 10/25/2011, SpO2 100.00%.  Intake/Output Summary (Last 24 hours) at 10/28/11 1053 Last data filed at 10/28/11 0500  Gross per 24 hour  Intake    360 ml  Output      2 ml  Net    358 ml    Exam Awake Alert, Oriented *3, No new F.N deficits, Normal affect [place].AT,PERRAL Supple Neck,No JVD, No cervical lymphadenopathy appriciated.  Symmetrical Chest wall movement, Good air movement bilaterally, CTAB RRR,No Gallops,Rubs or new Murmurs, No Parasternal Heave +ve B.Sounds, Abd Soft, Non tender, No organomegaly appriciated, No rebound -guarding or rigidity. No Cyanosis, Clubbing or edema, No new Rash or bruise  DISPOSITION: Home   DISCHARGE INSTRUCTIONS:    Follow-up Information    Follow up with Romero Belling, MD on 11/01/2011. (has appt at 9 am, (please keep it))         Total Time spent on discharge equals 45 minutes.  SignedJeoffrey Massed 10/28/2011 10:53 AM

## 2011-10-28 NOTE — Progress Notes (Signed)
Spoke to patient.  She is being discharged today.  Asked for insulin pens for ease of use.  Rx. Written by MD.  Discussed regimen with patient and importance of checking CBG's. Patient to f/u with Dr. Everardo All.

## 2011-10-28 NOTE — Progress Notes (Signed)
   CARE MANAGEMENT NOTE 10/28/2011  Patient:  Beverly Wells, Beverly Wells   Account Number:  [id number]  Date Initiated:  10/28/2011  Documentation initiated by:  Letha Cape  Subjective/Objective Assessment:   dx dka  admit- lives with family.     Action/Plan:   Anticipated DC Date:  10/28/2011   Anticipated DC Plan:  HOME/SELF CARE      DC Planning Services  CM consult      Choice offered to / List presented to:             Status of service:  Completed, signed off Medicare Important Message given?   (If response is "NO", the following Medicare IM given date fields will be blank) Date Medicare IM given:   Date Additional Medicare IM given:    Discharge Disposition:  HOME/SELF CARE  Per UR Regulation:    If discussed at Long Length of Stay Meetings, dates discussed:    Comments:  10/28/11 14:58 Letha Cape RN, BSN [phone] patient lives with family, pt for dc today, no needs were identified.

## 2011-10-28 NOTE — Progress Notes (Signed)
Beverly Wells to be D/C'd Home per MD order.  Discussed prescriptions and follow up appointments with the patient. Prescriptions given to patient, medication list explained in detail. Pt verbalized understanding.   Beverly Wells, Beverly Wells [facility]  Home Medication Instructions [id number]   Printed on:10/28/11 1108  Medication Information                    montelukast (SINGULAIR) 10 MG tablet Take 10 mg by mouth daily. Once daily           cetirizine (ZYRTEC) 10 MG tablet Take 10 mg by mouth daily.             insulin aspart (NOVOLOG) 100 UNIT/ML injection Inject 5 Units into the skin 3 (three) times daily before meals.           insulin glargine (LANTUS SOLOSTAR) 100 UNIT/ML injection Inject 35 Units into the skin at bedtime.             Filed Vitals:   10/28/11 0542  BP: 113/64  Pulse: 77  Temp: 98.2 F (36.8 C)  Resp: 18    Skin clean, dry and intact without evidence of skin break down, no evidence of skin tears noted. IV catheter discontinued intact. Site without signs and symptoms of complications. Dressing and pressure applied. Pt denies pain at this time. No complaints noted.  An After Visit Summary was printed and given to the patient. Patient escorted via WC, and D/C home via private auto.  Brett Albino 10/28/2011 11:08 AM

## 2011-11-01 ENCOUNTER — Ambulatory Visit: Payer: No Typology Code available for payment source | Admitting: Endocrinology

## 2011-11-01 DIAGNOSIS — Z0289 Encounter for other administrative examinations: Secondary | ICD-10-CM

## 2012-01-19 ENCOUNTER — Encounter (HOSPITAL_BASED_OUTPATIENT_CLINIC_OR_DEPARTMENT_OTHER): Payer: Self-pay

## 2012-01-19 ENCOUNTER — Inpatient Hospital Stay (HOSPITAL_BASED_OUTPATIENT_CLINIC_OR_DEPARTMENT_OTHER)
Admission: EM | Admit: 2012-01-19 | Discharge: 2012-01-21 | DRG: 639 | Disposition: A | Discharge status: Home / Self Care | Payer: Medicaid Other | Source: Ambulatory Visit | Attending: Family Medicine | Admitting: Family Medicine

## 2012-01-19 DIAGNOSIS — K029 Dental caries, unspecified: Secondary | ICD-10-CM | POA: Diagnosis present

## 2012-01-19 DIAGNOSIS — Z880 Allergy status to penicillin: Secondary | ICD-10-CM

## 2012-01-19 DIAGNOSIS — K122 Cellulitis and abscess of mouth: Secondary | ICD-10-CM

## 2012-01-19 DIAGNOSIS — K047 Periapical abscess without sinus: Secondary | ICD-10-CM | POA: Diagnosis present

## 2012-01-19 DIAGNOSIS — E109 Type 1 diabetes mellitus without complications: Secondary | ICD-10-CM | POA: Diagnosis present

## 2012-01-19 DIAGNOSIS — Z91199 Patient's noncompliance with other medical treatment and regimen due to unspecified reason: Secondary | ICD-10-CM

## 2012-01-19 DIAGNOSIS — E1010 Type 1 diabetes mellitus with ketoacidosis without coma: Principal | ICD-10-CM | POA: Diagnosis present

## 2012-01-19 DIAGNOSIS — E1165 Type 2 diabetes mellitus with hyperglycemia: Secondary | ICD-10-CM

## 2012-01-19 DIAGNOSIS — K0510 Chronic gingivitis, plaque induced: Secondary | ICD-10-CM | POA: Diagnosis present

## 2012-01-19 DIAGNOSIS — E118 Type 2 diabetes mellitus with unspecified complications: Secondary | ICD-10-CM

## 2012-01-19 DIAGNOSIS — IMO0002 Reserved for concepts with insufficient information to code with codable children: Secondary | ICD-10-CM

## 2012-01-19 DIAGNOSIS — Z9119 Patient's noncompliance with other medical treatment and regimen: Secondary | ICD-10-CM

## 2012-01-19 DIAGNOSIS — R112 Nausea with vomiting, unspecified: Secondary | ICD-10-CM

## 2012-01-19 DIAGNOSIS — J309 Allergic rhinitis, unspecified: Secondary | ICD-10-CM | POA: Diagnosis present

## 2012-01-19 DIAGNOSIS — E1110 Type 2 diabetes mellitus with ketoacidosis without coma: Secondary | ICD-10-CM | POA: Diagnosis present

## 2012-01-19 HISTORY — DX: Type 2 diabetes mellitus with ketoacidosis without coma: E11.10

## 2012-01-19 LAB — URINALYSIS, ROUTINE W REFLEX MICROSCOPIC
Bilirubin Urine: NEGATIVE
Glucose, UA: >1000 mg/dL — AB
Ketones, ur: >80 mg/dL — AB
Leukocytes, UA: NEGATIVE
Specific Gravity, Urine: 1.033 — ABNORMAL HIGH (ref 1.005–1.030)
pH: 5.5 (ref 5.0–8.0)

## 2012-01-19 LAB — COMPREHENSIVE METABOLIC PANEL
ALT: 7 U/L (ref 0–35)
Albumin: 4.7 g/dL (ref 3.5–5.2)
Alkaline Phosphatase: 119 U/L — ABNORMAL HIGH (ref 39–117)
Calcium: 10.5 mg/dL (ref 8.4–10.5)
Potassium: 4.7 mEq/L (ref 3.5–5.1)
Sodium: 131 mEq/L — ABNORMAL LOW (ref 135–145)
Total Protein: 9.5 g/dL — ABNORMAL HIGH (ref 6.0–8.3)

## 2012-01-19 LAB — BASIC METABOLIC PANEL
CO2: 15 mEq/L — ABNORMAL LOW (ref 19–32)
CO2: 17 mEq/L — ABNORMAL LOW (ref 19–32)
Calcium: 8.9 mg/dL (ref 8.4–10.5)
Chloride: 104 mEq/L (ref 96–112)
Chloride: 106 mEq/L (ref 96–112)
Chloride: 103 mEq/L (ref 96–112)
GFR calc Af Amer: >90 mL/min (ref >90)
GFR calc non Af Amer: >90 mL/min (ref >90)
Glucose, Bld: 148 mg/dL — ABNORMAL HIGH (ref 70–99)
Potassium: 3.5 mEq/L (ref 3.5–5.1)
Potassium: 3.6 mEq/L (ref 3.5–5.1)
Potassium: 3.5 mEq/L (ref 3.5–5.1)
Sodium: 136 mEq/L (ref 135–145)
Sodium: 133 mEq/L — ABNORMAL LOW (ref 135–145)

## 2012-01-19 LAB — URINE MICROSCOPIC-ADD ON

## 2012-01-19 LAB — DIFFERENTIAL
Basophils Absolute: 0 10*3/uL (ref 0.0–0.1)
Eosinophils Absolute: 0 10*3/uL (ref 0.0–0.7)
Lymphocytes Relative: 12 % (ref 12–46)
Monocytes Absolute: 0.2 10*3/uL (ref 0.1–1.0)
Neutrophils Relative %: 86 % — ABNORMAL HIGH (ref 43–77)

## 2012-01-19 LAB — GLUCOSE, CAPILLARY
Glucose-Capillary: 358 mg/dL — ABNORMAL HIGH (ref 70–99)
Glucose-Capillary: 167 mg/dL — ABNORMAL HIGH (ref 70–99)
Glucose-Capillary: 114 mg/dL — ABNORMAL HIGH (ref 70–99)
Glucose-Capillary: 110 mg/dL — ABNORMAL HIGH (ref 70–99)

## 2012-01-19 LAB — CBC
HCT: 37.1 % (ref 36.0–46.0)
Hemoglobin: 11.9 g/dL — ABNORMAL LOW (ref 12.0–15.0)
MCHC: 33.2 g/dL (ref 30.0–36.0)
MCHC: 32.1 g/dL (ref 30.0–36.0)
Platelets: 307 10*3/uL (ref 150–400)
RDW: 14.9 % (ref 11.5–15.5)

## 2012-01-19 MED ORDER — DEXTROSE-NACL 5-0.45 % IV SOLN
INTRAVENOUS | Status: DC
  Administered 2012-01-19: 18:00:00 via INTRAVENOUS

## 2012-01-19 MED ORDER — SODIUM CHLORIDE 0.9 % IV SOLN
INTRAVENOUS | Status: DC
  Administered 2012-01-19: 1.6 [IU]/h via INTRAVENOUS

## 2012-01-19 MED ORDER — POTASSIUM CHLORIDE 10 MEQ/100ML IV SOLN
10.0000 meq | INTRAVENOUS | Status: AC
  Filled 2012-01-19 (×2): qty 100

## 2012-01-19 MED ORDER — ENOXAPARIN SODIUM 40 MG/0.4ML ~~LOC~~ SOLN
40.0000 mg | SUBCUTANEOUS | Status: DC
  Administered 2012-01-19 – 2012-01-20 (×2): 40 mg via SUBCUTANEOUS
  Filled 2012-01-19 (×3): qty 0.4

## 2012-01-19 MED ORDER — POTASSIUM CHLORIDE 10 MEQ/100ML IV SOLN
10.0000 meq | INTRAVENOUS | Status: AC
  Administered 2012-01-19 (×2): 10 meq via INTRAVENOUS
  Filled 2012-01-19 (×2): qty 100

## 2012-01-19 MED ORDER — SODIUM CHLORIDE 0.9 % IV SOLN: 1000.0000 mL | INTRAVENOUS | Status: DC

## 2012-01-19 MED ORDER — SODIUM CHLORIDE 0.9 % IV BOLUS (SEPSIS)
1000.0000 mL | Freq: Once | INTRAVENOUS | Status: AC
  Administered 2012-01-19: 1000 mL via INTRAVENOUS

## 2012-01-19 MED ORDER — ENOXAPARIN SODIUM 30 MG/0.3ML ~~LOC~~ SOLN: 30.0000 mg | SUBCUTANEOUS | Status: DC

## 2012-01-19 MED ORDER — INSULIN GLARGINE 100 UNIT/ML ~~LOC~~ SOLN
30.0000 [IU] | Freq: Every day | SUBCUTANEOUS | Status: DC
  Administered 2012-01-19: 30 [IU] via SUBCUTANEOUS

## 2012-01-19 MED ORDER — KETOROLAC TROMETHAMINE 30 MG/ML IJ SOLN
30.0000 mg | Freq: Once | INTRAMUSCULAR | Status: AC
  Administered 2012-01-19: 30 mg via INTRAVENOUS
  Filled 2012-01-19: qty 1

## 2012-01-19 MED ORDER — ONDANSETRON HCL 4 MG/2ML IJ SOLN
4.0000 mg | Freq: Once | INTRAMUSCULAR | Status: AC
  Administered 2012-01-19: 4 mg via INTRAVENOUS
  Filled 2012-01-19: qty 2

## 2012-01-19 MED ORDER — INSULIN ASPART 100 UNIT/ML ~~LOC~~ SOLN
0.0000 [IU] | Freq: Three times a day (TID) | SUBCUTANEOUS | Status: DC
  Administered 2012-01-20: 8 [IU] via SUBCUTANEOUS
  Administered 2012-01-20 – 2012-01-21 (×2): 5 [IU] via SUBCUTANEOUS

## 2012-01-19 MED ORDER — INSULIN ASPART 100 UNIT/ML IV SOLN
6.0000 [IU] | Freq: Once | INTRAVENOUS | Status: AC
  Administered 2012-01-19: 6 [IU] via INTRAVENOUS
  Filled 2012-01-19: qty 6
  Filled 2012-01-19: qty 0.06

## 2012-01-19 MED ORDER — INSULIN REGULAR HUMAN 100 UNIT/ML IJ SOLN
INTRAMUSCULAR | Status: DC
  Filled 2012-01-19: qty 1

## 2012-01-19 MED ORDER — INSULIN ASPART 100 UNIT/ML ~~LOC~~ SOLN: 0.0000 [IU] | Freq: Three times a day (TID) | SUBCUTANEOUS | Status: DC

## 2012-01-19 MED ORDER — DOXYCYCLINE HYCLATE 100 MG IV SOLR
100.0000 mg | Freq: Two times a day (BID) | INTRAVENOUS | Status: DC
  Administered 2012-01-19 – 2012-01-20 (×2): 100 mg via INTRAVENOUS
  Filled 2012-01-19 (×3): qty 100

## 2012-01-19 MED ORDER — INSULIN REGULAR HUMAN 100 UNIT/ML IJ SOLN
INTRAMUSCULAR | Status: AC
  Filled 2012-01-19: qty 1

## 2012-01-19 MED ORDER — LORATADINE 10 MG PO TABS
10.0000 mg | ORAL_TABLET | Freq: Every day | ORAL | Status: DC
  Administered 2012-01-19 – 2012-01-21 (×3): 10 mg via ORAL
  Filled 2012-01-19 (×3): qty 1

## 2012-01-19 MED ORDER — SODIUM CHLORIDE 0.9 % IV SOLN: INTRAVENOUS | Status: DC

## 2012-01-19 MED ORDER — DEXTROSE 50 % IV SOLN
25.0000 mL | INTRAVENOUS | Status: DC | PRN
  Administered 2012-01-19: 15 mL via INTRAVENOUS
  Administered 2012-01-20: 50 mL via INTRAVENOUS
  Administered 2012-01-20 (×2): 25 mL via INTRAVENOUS
  Filled 2012-01-19 (×3): qty 50

## 2012-01-19 MED ORDER — SODIUM CHLORIDE 0.9 % IV SOLN
INTRAVENOUS | Status: DC
  Administered 2012-01-20: via INTRAVENOUS

## 2012-01-19 NOTE — ED Notes (Signed)
Report to [facility].  ETA 15 minutes

## 2012-01-19 NOTE — Progress Notes (Signed)
CBG: 63   Treatment: 15 ml of  D 50 vial per glucostabilizer and hold of insulin drip  Symptoms: None  Follow-up CBG: Time:1755 CBG Result:96  Possible Reasons for Event: Medication regimen: insulin drip  Comments/MD notified:MD, Dr. Laural Benes to unit and made aware    Dondra Spry

## 2012-01-19 NOTE — ED Provider Notes (Signed)
History     CSN: [id number]  Arrival date & time 01/19/12  0944   First MD Initiated Contact with Patient 01/19/12 1057      Chief Complaint  Patient presents with  . Hyperglycemia  . Nausea    (Consider location/radiation/quality/duration/timing/severity/associated sxs/prior treatment) HPI Comments: Patient with history of Type 1 DM.  Presents with high sugars, not feeling well.  Has history of dka in the past and this feels the same.  No fevers or chills.  No diarrhea or urinary complaints.    The history is provided by the patient.    Past Medical History  Diagnosis Date  . 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 ALLERGIC RHINITIS 05/07/2010  . DKA (diabetic ketoacidoses)     History reviewed. No pertinent past surgical history.  Family History  Problem Relation Age of Onset  . Diabetes Neg Hx     History  Substance Use Topics  . Smoking status: Never Smoker   . Smokeless tobacco: Not on file  . Alcohol Use: No    OB History    Grav Para Term Preterm Abortions TAB SAB Ect Mult Living                  Review of Systems  Constitutional: Positive for fatigue.  Gastrointestinal: Positive for nausea. Negative for diarrhea.  All other systems reviewed and are negative.    Allergies  Penicillins  Home Medications   Current Outpatient Rx  Name Route Sig Dispense Refill  . CETIRIZINE HCL 10 MG PO TABS Oral Take 10 mg by mouth daily.      . INSULIN ASPART 100 UNIT/ML [place] SOLN  Inject 5 units subcutaneously before meals three times daily  -please provide pen if possible 1 vial 0    Please provide pen if possible  . INSULIN GLARGINE 100 UNIT/ML [place] SOLN Subcutaneous Inject 35 Units into the skin at bedtime. 10 mL 0  . MONTELUKAST SODIUM 10 MG PO TABS Oral Take 10 mg by mouth daily. Once daily    . UNABLE TO FIND  1. Insulin pen needles- 4 mm-1 box 1 Container 0    BP 131/70  Pulse 102  Temp(Src) 98.4 F (36.9 C)  (Oral)  Resp 18  Ht 5\' 7"  (1.702 m)  Wt 150 lb (68.04 kg)  BMI 23.49 kg/m2  SpO2 100%  LMP 01/12/2012  Physical Exam  Nursing note and vitals reviewed. Constitutional: She is oriented to person, place, and time. She appears well-developed and well-nourished. No distress.  HENT:  Head: Normocephalic and atraumatic.       MM's somewhat dry  Neck: Normal range of motion. Neck supple.  Cardiovascular: Normal rate and regular rhythm.  Exam reveals no gallop and no friction rub.   No murmur heard. Pulmonary/Chest: Effort normal and breath sounds normal. No respiratory distress. She has no wheezes.  Abdominal: Soft. Bowel sounds are normal. She exhibits no distension. There is no tenderness.  Musculoskeletal: Normal range of motion.  Neurological: She is alert and oriented to person, place, and time.  Skin: Skin is warm and dry. She is not diaphoretic.    ED Course  Procedures (including critical care time)  Labs Reviewed  GLUCOSE, CAPILLARY - Abnormal; Notable for the following:    Glucose-Capillary 358 (*)    All other components within normal limits  CBC - Abnormal; Notable for the following:    RBC 6.12 (*)    MCV  70.9 (*)    MCH 23.5 (*)    All other components within normal limits  DIFFERENTIAL  PREGNANCY, URINE  COMPREHENSIVE METABOLIC PANEL  URINALYSIS, ROUTINE W REFLEX MICROSCOPIC   No results found.   No diagnosis found.    MDM  She continues to feel very nauseated.  The labs returned showing an anion gap of 26 with co2 of 15, reflecting dka.  I will consult internal medicine for admission of this.        Geoffery Lyons, MD 01/19/12 1302

## 2012-01-19 NOTE — Progress Notes (Signed)
Pt admitted to the unit. Pt is alert and oriented. Pt oriented to room, staff, and call bell. Bed in lowest position. Full assessment to Epic. Call bell with in reach. Told to call for assists. Will continue to monitor. Westyn Keatley, RN 

## 2012-01-19 NOTE — ED Notes (Signed)
CBG 221mg Excell Seltzer

## 2012-01-19 NOTE — ED Notes (Signed)
States she took 80 units of Levemir this am.

## 2012-01-19 NOTE — H&P (Signed)
History and Physical Examination  Date: 01/19/2012  Patient name: Beverly Wells Medical record number: [id number] Date of birth: 1991-04-22 Age: 21 y.o. Gender: female PCP: Beverly Belling, MD, MD  Chief Complaint:  Chief Complaint  Patient presents with  . Hyperglycemia  . Nausea     History of Present Illness: Beverly Wells is an 21 y.o. female with type 1 diabetes mellitus, poorly controlled, with dental complications, resume it to the emergency department today after waking up this morning with nausea and vomiting acutely developed.  She had been suffering with a significant tooth pain for the past several days.  She reports that she's not able to eat on the left side of the mouth because of pain.  She reports no fever or chills.  She was seen in the emergency department and found to have an elevated anion gap and she was in DKA.  The patient was started on an IV insulin infusion and hospital admission was requested for further evaluation.  The patient reports that she has no medical insurance.  She has applied for Medicaid benefits.  She reports that she will not receive Medicaid benefits until sometime in July of this year.  She reports that she's been out of her regularly taken Lantus and NovoLog medication.  She reports that she had some leftover Levemir that she's been taking recently.  The patient has not been able to afford Lantus and NovoLog with no insurance.  Past Medical History Past Medical History  Diagnosis Date  . 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 ALLERGIC RHINITIS 05/07/2010  . DKA (diabetic ketoacidoses)     Past Surgical History History reviewed. No pertinent past surgical history.  Home Meds: Prior to Admission medications   Medication Sig Start Date End Date Taking? Authorizing Provider  cetirizine (ZYRTEC) 10 MG tablet Take 10 mg by mouth daily.     Yes Historical Provider, MD         insulin detemir (LEVEMIR)  100 UNIT/ML injection Inject 80 Units into the skin at bedtime.   Yes Historical Provider, MD   Allergies: Penicillins  Social History:  History   Social History  . Marital Status: Single    Spouse Name: N/A    Number of Children: N/A  . Years of Education: N/A   Occupational History  . Not on file.   Social History Main Topics  . Smoking status: Never Smoker   . Smokeless tobacco: Not on file  . Alcohol Use: No  . Drug Use: No  . Sexually Active: Not on file          Social History Narrative   Lives with Beverly Wells)High school student.   Family History:  Family History  Problem Relation Age of Onset  . Diabetes Neg Hx     Review of Systems: Pertinent items are noted in HPI. All other systems reviewed and reported as negative.   Physical Exam: Blood pressure 105/64, pulse 92, temperature 98.6 F (37 C), temperature source Oral, resp. rate 16, height 5\' 7"  (1.702 m), weight 67.132 kg (148 lb), last menstrual period 01/12/2012, SpO2 100.00%. General appearance: alert, cooperative, appears stated age and mild distress Head: Normocephalic, without obvious abnormality, atraumatic Eyes: negative, conjunctivae/corneas clear. PERRL, EOM's intact. Fundi benign. Nose: Nares normal. Septum midline. Mucosa normal. No drainage or sinus tenderness., no discharge Throat: Gingivitis noted and swollen red gum area around a silver molar tooth lower left incisor  Neck: no adenopathy, no carotid bruit, no JVD, supple, symmetrical, trachea midline and thyroid not enlarged, symmetric, no tenderness/mass/nodules Lungs: clear to auscultation bilaterally Heart: regular rate and rhythm, S1, S2 normal, no murmur, click, rub or gallop Abdomen: soft, non-tender; bowel sounds normal; no masses,  no organomegaly Extremities: extremities normal, atraumatic, no cyanosis or edema  Lab  And Imaging results:  Results for orders placed during the hospital encounter of 01/19/12 (from the  past 24 hour(s))  GLUCOSE, CAPILLARY     Status: Abnormal   Collection Time   01/19/12  9:46 AM      Component Value Range   Glucose-Capillary 358 (*) 70 - 99 (mg/dL)  CBC     Status: Abnormal   Collection Time   01/19/12 10:34 AM      Component Value Range   WBC 8.9  4.0 - 10.5 (K/uL)   RBC 6.12 (*) 3.87 - 5.11 (MIL/uL)   Hemoglobin 14.4  12.0 - 15.0 (g/dL)   HCT 16.1  09.6 - 04.5 (%)   MCV 70.9 (*) 78.0 - 100.0 (fL)   MCH 23.5 (*) 26.0 - 34.0 (pg)   MCHC 33.2  30.0 - 36.0 (g/dL)   RDW 40.9  81.1 - 91.4 (%)   Platelets 307  150 - 400 (K/uL)  DIFFERENTIAL     Status: Abnormal   Collection Time   01/19/12 10:34 AM      Component Value Range   Neutrophils Relative 86 (*) 43 - 77 (%)   Lymphocytes Relative 12  12 - 46 (%)   Monocytes Relative 2 (*) 3 - 12 (%)   Eosinophils Relative 0  0 - 5 (%)   Basophils Relative 0  0 - 1 (%)   Neutro Abs 7.6  1.7 - 7.7 (K/uL)   Lymphs Abs 1.1  0.7 - 4.0 (K/uL)   Monocytes Absolute 0.2  0.1 - 1.0 (K/uL)   Eosinophils Absolute 0.0  0.0 - 0.7 (K/uL)   Basophils Absolute 0.0  0.0 - 0.1 (K/uL)   RBC Morphology SPHEROCYTES    COMPREHENSIVE METABOLIC PANEL     Status: Abnormal   Collection Time   01/19/12 10:34 AM      Component Value Range   Sodium 131 (*) 135 - 145 (mEq/L)   Potassium 4.7  3.5 - 5.1 (mEq/L)   Chloride 94 (*) 96 - 112 (mEq/L)   CO2 15 (*) 19 - 32 (mEq/L)   Glucose, Bld 361 (*) 70 - 99 (mg/dL)   BUN 16  6 - 23 (mg/dL)   Creatinine, Ser 7.82  0.50 - 1.10 (mg/dL)   Calcium 95.6  8.4 - 10.5 (mg/dL)   Total Protein 9.5 (*) 6.0 - 8.3 (g/dL)   Albumin 4.7  3.5 - 5.2 (g/dL)   AST 17  0 - 37 (U/L)   ALT 7  0 - 35 (U/L)   Alkaline Phosphatase 119 (*) 39 - 117 (U/L)   Total Bilirubin 0.3  0.3 - 1.2 (mg/dL)   GFR calc non Af Amer >90  >90 (mL/min)   GFR calc Af Amer >90  >90 (mL/min)  URINALYSIS, ROUTINE W REFLEX MICROSCOPIC     Status: Abnormal   Collection Time   01/19/12 11:00 AM      Component Value Range   Color, Urine YELLOW   YELLOW    APPearance CLEAR  CLEAR    Specific Gravity, Urine 1.033 (*) 1.005 - 1.030    pH 5.5  5.0 - 8.0  Glucose, UA >1000 (*) NEGATIVE (mg/dL)   Hgb urine dipstick LARGE (*) NEGATIVE    Bilirubin Urine NEGATIVE  NEGATIVE    Ketones, ur >80 (*) NEGATIVE (mg/dL)   Protein, ur 30 (*) NEGATIVE (mg/dL)   Urobilinogen, UA 0.2  0.0 - 1.0 (mg/dL)   Nitrite NEGATIVE  NEGATIVE    Leukocytes, UA NEGATIVE  NEGATIVE   PREGNANCY, URINE     Status: Normal   Collection Time   01/19/12 11:00 AM      Component Value Range   Preg Test, Ur NEGATIVE  NEGATIVE   URINE MICROSCOPIC-ADD ON     Status: Normal   Collection Time   01/19/12 11:00 AM      Component Value Range   Squamous Epithelial / LPF RARE  RARE    WBC, UA 0-2  <3 (WBC/hpf)   RBC / HPF 7-10  <3 (RBC/hpf)   Bacteria, UA RARE  RARE    Urine-Other RARE YEAST    GLUCOSE, CAPILLARY     Status: Abnormal   Collection Time   01/19/12  2:46 PM      Component Value Range   Glucose-Capillary 167 (*) 70 - 99 (mg/dL)  GLUCOSE, CAPILLARY     Status: Abnormal   Collection Time   01/19/12  4:13 PM      Component Value Range   Glucose-Capillary 179 (*) 70 - 99 (mg/dL)     Impression   *DKA (diabetic ketoacidoses)  IDDM  ALLERGIC RHINITIS  PERS HX NONCOMPLIANCE W/MED TX PRS HAZARDS HLTH  Dental abscess  Plan  The patient has poorly controlled type 1 diabetes mellitus mostly related to the fact that she has no medical insurance and cannot afford the insulins that she's taking.  She cannot afford her diabetes supplies.  She's going to be admitted and started on a Glucomander protocol for IV insulin infusion, we'll transition her to Lantus and NovoLog.  She probably will be discharged home on regular insulin and NPH 2 to the cost of Lantus and NovoLog.  We'll check with the care coordinator to see if we are able to provide her with assistance for Lantus and NovoLog for the remaining 30-40 days until she gets Medicaid benefits.  I also called and spoke  with the dentist on call.  He reports that normally patient's will followup in the office to see him.  I explained that the patient did not have medical insurance.  He says they normally will see him for acute emergency services light dental extraction.  He said that treating the patient with antibiotics would be appropriate until she got her insurance benefits so that she can see a dentist outpatient.  Will monitor electrolytes closely.  Discontinue the IV insulin infusion when the anion gap has normalized.  Please see orders.  Will hydrate and replace electrolytes as needed.  Standley Dakins MD [facility] [place], [place] [phone] 01/19/2012, 4:39 PM

## 2012-01-19 NOTE — ED Notes (Signed)
Pt uncooperative and refusing to put on a gown or allow me to start and IV/collect blood.  Discussed reason for IV and blood collection.

## 2012-01-19 NOTE — ED Notes (Signed)
CBG 358mg /dL.

## 2012-01-19 NOTE — ED Notes (Signed)
Pt reports feeling bad for a week , vomited x 1 last pm and CBG was 323 this am.  Has been out of insulin 1 week.

## 2012-01-19 NOTE — ED Notes (Signed)
[facility] at bedside and preparing for transport

## 2012-01-19 NOTE — ED Notes (Signed)
Report called to Cami, RN unit nurse

## 2012-01-20 DIAGNOSIS — K122 Cellulitis and abscess of mouth: Secondary | ICD-10-CM

## 2012-01-20 DIAGNOSIS — E1010 Type 1 diabetes mellitus with ketoacidosis without coma: Secondary | ICD-10-CM

## 2012-01-20 DIAGNOSIS — IMO0002 Reserved for concepts with insufficient information to code with codable children: Secondary | ICD-10-CM

## 2012-01-20 DIAGNOSIS — E1165 Type 2 diabetes mellitus with hyperglycemia: Secondary | ICD-10-CM

## 2012-01-20 DIAGNOSIS — E118 Type 2 diabetes mellitus with unspecified complications: Secondary | ICD-10-CM

## 2012-01-20 DIAGNOSIS — R112 Nausea with vomiting, unspecified: Secondary | ICD-10-CM

## 2012-01-20 LAB — GLUCOSE, CAPILLARY
Glucose-Capillary: 126 mg/dL — ABNORMAL HIGH (ref 70–99)
Glucose-Capillary: 39 mg/dL — CL (ref 70–99)
Glucose-Capillary: 96 mg/dL (ref 70–99)
Glucose-Capillary: 101 mg/dL — ABNORMAL HIGH (ref 70–99)
Glucose-Capillary: 62 mg/dL — ABNORMAL LOW (ref 70–99)
Glucose-Capillary: 102 mg/dL — ABNORMAL HIGH (ref 70–99)
Glucose-Capillary: 202 mg/dL — ABNORMAL HIGH (ref 70–99)

## 2012-01-20 LAB — BASIC METABOLIC PANEL
BUN: 11 mg/dL (ref 6–23)
Calcium: 8.6 mg/dL (ref 8.4–10.5)
Chloride: 105 mEq/L (ref 96–112)
GFR calc Af Amer: >90 mL/min (ref >90)
GFR calc Af Amer: >90 mL/min (ref >90)
GFR calc non Af Amer: >90 mL/min (ref >90)
Potassium: 3.6 mEq/L (ref 3.5–5.1)
Potassium: 3.6 mEq/L (ref 3.5–5.1)
Sodium: 133 mEq/L — ABNORMAL LOW (ref 135–145)

## 2012-01-20 LAB — HEMOGLOBIN A1C: Mean Plasma Glucose: 329 mg/dL — ABNORMAL HIGH (ref <117)

## 2012-01-20 MED ORDER — INSULIN ASPART 100 UNIT/ML ~~LOC~~ SOLN
10.0000 [IU] | Freq: Three times a day (TID) | SUBCUTANEOUS | Status: DC
  Administered 2012-01-21: 10 [IU] via SUBCUTANEOUS

## 2012-01-20 MED ORDER — INSULIN ASPART 100 UNIT/ML ~~LOC~~ SOLN
8.0000 [IU] | Freq: Three times a day (TID) | SUBCUTANEOUS | Status: DC
  Administered 2012-01-20: 8 [IU] via SUBCUTANEOUS

## 2012-01-20 MED ORDER — INSULIN GLARGINE 100 UNIT/ML ~~LOC~~ SOLN
34.0000 [IU] | Freq: Every day | SUBCUTANEOUS | Status: DC
  Administered 2012-01-20: 34 [IU] via SUBCUTANEOUS

## 2012-01-20 MED ORDER — INSULIN ASPART 100 UNIT/ML ~~LOC~~ SOLN: 10.0000 [IU] | Freq: Three times a day (TID) | SUBCUTANEOUS | Status: DC

## 2012-01-20 MED ORDER — DOXYCYCLINE HYCLATE 100 MG PO TABS
100.0000 mg | ORAL_TABLET | Freq: Two times a day (BID) | ORAL | Status: DC
  Administered 2012-01-20 – 2012-01-21 (×3): 100 mg via ORAL
  Filled 2012-01-20 (×4): qty 1

## 2012-01-20 MED ORDER — DEXTROSE-NACL 5-0.45 % IV SOLN
INTRAVENOUS | Status: DC
  Administered 2012-01-20: 05:00:00 via INTRAVENOUS

## 2012-01-20 NOTE — Progress Notes (Signed)
Brief Nutrition Note  RD drawn to chart 2/2 Nutrition Risk Report. Pt reports problem with chewing/swallowing related to tooth pain.  Pt admitted with poorly controlled DM and dental complications. Noted that pt is unable to to afford medications and therefore does not take them regularly.   Discussed education needs and pt reports that she does not follow a diabetic diet and does not pay attention to the foods that she eats. When asked if she counts her carbohydrates, pt stated "No." When asked if she wants to learn how to count carbohydrates pt stated "I already know how, I just don't want to do it."  Current weight is 148 lb. Body mass index is 23.18 kg/(m^2). Weight is WNL. Eating 100% of CHO Modified Medium Diet.  Re-consult RD if pt shows interest in DM diet education.  Beverly Wells Pager #: [phone]

## 2012-01-20 NOTE — Consult Note (Signed)
01-20-12 Dr. Laural Benes I talked with patient about the Lilly voucher to use at [facility] for Humulin 70/30 for 5 pens of 300 units each.  Explained to patient to take Rx and voucher to [facility] and she can get refills for 70/30 (Novo/ReliOn) with Rx refills should she not yet have her medicaid.  Again, Rx must be written for 70/30, 30 units bid (bf and supper) with refills (NOT "Dispense as written")  Pt states she has 'plenty of strips (free style) at home', as she has 'not been checking her blood sugars' as she should for a long time.  Voucher and paperwork with instructions for buying the insulin and reli-on strips (if needed) in shadow chart. Please feel free to call with questions or assistance.  Voucher must be presented to [facility] with Rx script.  Thank you, Lenor Coffin, RN, CNS, Diabetes Coordinator [phone])

## 2012-01-20 NOTE — Progress Notes (Signed)
   CARE MANAGEMENT NOTE 01/20/2012  Patient:  Beverly Wells, Beverly Wells   Account Number:  [id number]  Date Initiated:  01/20/2012  Documentation initiated by:  Darlyne Russian  Subjective/Objective Assessment:   Patient admitted with hyperglycemia     Action/Plan:   Progression of care and discharge planning   Anticipated DC Date:  01/21/2012   Anticipated DC Plan:  HOME/SELF CARE      DC Planning Services  CM consult      Choice offered to / List presented to:             Status of service:  In process, will continue to follow Medicare Important Message given?   (If response is "NO", the following Medicare IM given date fields will be blank) Date Medicare IM given:   Date Additional Medicare IM given:    Discharge Disposition:    Per UR Regulation:    If discussed at Long Length of Stay Meetings, dates discussed:    Comments:  Endocrinologist: Dr Everardo All  01/20/2012  [address] RN, [place]  [phone] Patient seen by A.Clark RN Diabetic Coordinator to assist with obtaining insulin upon discharge, she provided patient with a voucher to obtain insulin from [facility].  01/20/2012 0900 Darlyne Russian RN, CCM [phone]  Met with patient to discuss CM and discharge planning. She had Medicaid until 06/2011, and cancelled because she turned 20. She completed the Medicaid paperwork in March 2013 and told it takes 45 days for a decision. She has called Social Services every Monday and still has no decision. She was not able to afford the Novolog and Lantus without  Medicaid, at  [facility]. CM suggested she call Social Services office today, not wait until Monday to follow up on the application,  she may have to go to the offices to follow up on the application process.  Per MD notes he may discharge patient with Regular Insulin and NPH. Per Pharmacy patient would be eligible iindigent fund for Regular and NPH insulin.

## 2012-01-20 NOTE — Progress Notes (Signed)
   CARE MANAGEMENT NOTE 01/20/2012  Patient:  Beverly Wells, Beverly Wells   Account Number:  [id number]  Date Initiated:  01/20/2012  Documentation initiated by:  Darlyne Russian  Subjective/Objective Assessment:   Patient admitted with hyperglycemia     Action/Plan:   Progression of care and discharge planning   Anticipated DC Date:  01/21/2012   Anticipated DC Plan:  HOME/SELF CARE      DC Planning Services  CM consult      Choice offered to / List presented to:             Status of service:  In process, will continue to follow Medicare Important Message given?   (If response is "NO", the following Medicare IM given date fields will be blank) Date Medicare IM given:   Date Additional Medicare IM given:    Discharge Disposition:    Per UR Regulation:    If discussed at Long Length of Stay Meetings, dates discussed:    Comments:  Endocrinologist: Dr Everardo All  01/20/2012 0900 Darlyne Russian RN, CCM [phone]  Met with patient to discuss CM and discharge planning. She had Medicaid until 06/2011, and cancelled because she turned 20. She completed the Medicaid paperwork in March 2013 and told it takes 45 days for a decision. She has called Social Services every Monday and still has no decision. She was not able to afford the Novolog and Lantus without  Medicaid, at  [facility]. CM suggested she call Social Services office today, not wait until Monday to follow up on the application,  she may have to go to the offices to follow up on the application process.  Per MD notes he may discharge patient with Regular Insulin and NPH. Per Pharmacy patient would be eligible iindigent fund for Regular and NPH insulin.

## 2012-01-20 NOTE — Progress Notes (Signed)
Utilization review completed. Lexa Coronado RN, CCM  

## 2012-01-20 NOTE — Progress Notes (Signed)
Inpatient Diabetes Program Recommendations  AACE/ADA: New Consensus Statement on Inpatient Glycemic Control (2009)  Target Ranges:  Prepandial:   less than 140 mg/dL      Peak postprandial:   less than 180 mg/dL (1-2 hours)      Critically ill patients:  140 - 180 mg/dL   Reason for Visit: DKA admission with no insurance   Spoke with Dr. Laural Benes regarding insulin at discharge.  I have a voucher for free supply of Lilly Humulin insulin in the pre-filled 70/30 pen (a 5 pack total of 1500 units).  Pt will need prescription with voucher to take to a [facility] to get the initial 70/30  insulin.  Will also need a prescription for pen needles.  Should pt not get her Medicaid, she can refill the 70/30 at $24.88. (they may substittue the Novolin 70/30, but it will be the same insulin of NPH and R).  Prescription must be written 70/30 30 units in am and 30 units ac supper.  Pt prescription must NOT be  "Dispense as written" in order that the pharmacy can refill 70/30 Novolin/ReliOn ([facility] generic).  Note: Will talk with patient and explain this plan until she can be followed.  Hopefully patient followed by [facility]! (?)  Thank you, Lenor Coffin, RN, CNS, Diabetes Coordinator [phone])

## 2012-01-20 NOTE — Progress Notes (Signed)
[facility] Progress Note  01/20/2012   Subjective: Pt says that she is feeling better.  No further n/v.  Pt tolerated breakfast.    Objective:  Vital signs in last 24 hours: Filed Vitals:   01/19/12 1806 01/19/12 2009 01/20/12 0507 01/20/12 0900  BP: 112/68 103/40 104/53 101/55  Pulse: 98 90 80 85  Temp: 98.7 F (37.1 C) 99.6 F (37.6 C) 98.2 F (36.8 C) 97 F (36.1 C)  TempSrc: Oral Oral Oral Oral  Resp: 16 16 16 18   Height:      Weight:  67.132 kg (148 lb)    SpO2: 100% 100% 100% 98%   Weight change:   Intake/Output Summary (Last 24 hours) at 01/20/12 1231 Last data filed at 01/20/12 1100  Gross per 24 hour  Intake   4520 ml  Output      1 ml  Net   4519 ml   Lab Results  Component Value Date   HGBA1C 13.1* 01/19/2012   HGBA1C 11.7* 10/27/2011   HGBA1C  Value: 11.8 (NOTE)                                                                       According to the ADA Clinical Practice Recommendations for 2011, when HbA1c is used as a screening test:   >=6.5%   Diagnostic of Diabetes Mellitus           (if abnormal result  is confirmed)  5.7-6.4%   Increased risk of developing Diabetes Mellitus  References:Diagnosis and Classification of Diabetes Mellitus,Diabetes Care,2011,34(Suppl 1):S62-S69 and Standards of Medical Care in         Diabetes - 2011,Diabetes Care,2011,34  (Suppl 1):S11-S61.* 08/17/2010   Lab Results  Component Value Date   CREATININE 0.51 01/20/2012    Review of Systems As above, otherwise all reviewed and reported negative  Physical Exam General - awake, no distress, cooperative HEENT - NCAT, MMM Lungs - BBS, CTA CV - normal s1, s2 sounds Abd - soft, nondistended, no masses, nontender Ext - no C/C/E  Lab Results: Results for orders placed during the hospital encounter of 01/19/12 (from the past 24 hour(s))  GLUCOSE, CAPILLARY     Status: Abnormal   Collection Time   01/19/12  2:46 PM      Component Value Range   Glucose-Capillary 167 (*) 70 -  99 (mg/dL)  GLUCOSE, CAPILLARY     Status: Abnormal   Collection Time   01/19/12  4:13 PM      Component Value Range   Glucose-Capillary 179 (*) 70 - 99 (mg/dL)  GLUCOSE, CAPILLARY     Status: Abnormal   Collection Time   01/19/12  5:25 PM      Component Value Range   Glucose-Capillary 63 (*) 70 - 99 (mg/dL)   Comment 1 Notify RN     Comment 2 Documented in Chart    GLUCOSE, CAPILLARY     Status: Normal   Collection Time   01/19/12  5:56 PM      Component Value Range   Glucose-Capillary 96  70 - 99 (mg/dL)   Comment 1 Notify RN     Comment 2 Documented in Chart    BASIC METABOLIC PANEL  Status: Abnormal   Collection Time   01/19/12  6:01 PM      Component Value Range   Sodium 134 (*) 135 - 145 (mEq/L)   Potassium 3.5  3.5 - 5.1 (mEq/L)   Chloride 104  96 - 112 (mEq/L)   CO2 15 (*) 19 - 32 (mEq/L)   Glucose, Bld 101 (*) 70 - 99 (mg/dL)   BUN 13  6 - 23 (mg/dL)   Creatinine, Ser 1.61 (*) 0.50 - 1.10 (mg/dL)   Calcium 8.8  8.4 - 09.6 (mg/dL)   GFR calc non Af Amer >90  >90 (mL/min)   GFR calc Af Amer >90  >90 (mL/min)  CBC     Status: Abnormal   Collection Time   01/19/12  6:01 PM      Component Value Range   WBC 6.4  4.0 - 10.5 (K/uL)   RBC 5.15 (*) 3.87 - 5.11 (MIL/uL)   Hemoglobin 11.9 (*) 12.0 - 15.0 (g/dL)   HCT 04.5  40.9 - 81.1 (%)   MCV 72.0 (*) 78.0 - 100.0 (fL)   MCH 23.1 (*) 26.0 - 34.0 (pg)   MCHC 32.1  30.0 - 36.0 (g/dL)   RDW 91.4  78.2 - 95.6 (%)   Platelets 251  150 - 400 (K/uL)  HEMOGLOBIN A1C     Status: Abnormal   Collection Time   01/19/12  6:01 PM      Component Value Range   Hemoglobin A1C 13.1 (*) <5.7 (%)   Mean Plasma Glucose 329 (*) <117 (mg/dL)  BASIC METABOLIC PANEL     Status: Abnormal   Collection Time   01/19/12  6:02 PM      Component Value Range   Sodium 136  135 - 145 (mEq/L)   Potassium 3.6  3.5 - 5.1 (mEq/L)   Chloride 106  96 - 112 (mEq/L)   CO2 15 (*) 19 - 32 (mEq/L)   Glucose, Bld 103 (*) 70 - 99 (mg/dL)   BUN 13  6 - 23 (mg/dL)     Creatinine, Ser 2.13  0.50 - 1.10 (mg/dL)   Calcium 8.9  8.4 - 08.6 (mg/dL)   GFR calc non Af Amer >90  >90 (mL/min)   GFR calc Af Amer >90  >90 (mL/min)  GLUCOSE, CAPILLARY     Status: Abnormal   Collection Time   01/19/12  6:59 PM      Component Value Range   Glucose-Capillary 114 (*) 70 - 99 (mg/dL)   Comment 1 Documented in Chart     Comment 2 Notify RN    GLUCOSE, CAPILLARY     Status: Abnormal   Collection Time   01/19/12  8:06 PM      Component Value Range   Glucose-Capillary 110 (*) 70 - 99 (mg/dL)   Comment 1 Notify RN     Comment 2 Documented in Chart    GLUCOSE, CAPILLARY     Status: Abnormal   Collection Time   01/19/12  9:18 PM      Component Value Range   Glucose-Capillary 150 (*) 70 - 99 (mg/dL)   Comment 1 Notify RN     Comment 2 Documented in Chart    BASIC METABOLIC PANEL     Status: Abnormal   Collection Time   01/19/12  9:26 PM      Component Value Range   Sodium 133 (*) 135 - 145 (mEq/L)   Potassium 3.5  3.5 - 5.1 (mEq/L)   Chloride 103  96 - 112 (mEq/L)   CO2 17 (*) 19 - 32 (mEq/L)   Glucose, Bld 148 (*) 70 - 99 (mg/dL)   BUN 12  6 - 23 (mg/dL)   Creatinine, Ser 1.61  0.50 - 1.10 (mg/dL)   Calcium 8.7  8.4 - 09.6 (mg/dL)   GFR calc non Af Amer >90  >90 (mL/min)   GFR calc Af Amer >90  >90 (mL/min)  GLUCOSE, CAPILLARY     Status: Abnormal   Collection Time   01/19/12 10:27 PM      Component Value Range   Glucose-Capillary 164 (*) 70 - 99 (mg/dL)   Comment 1 Notify RN     Comment 2 Documented in Chart    BASIC METABOLIC PANEL     Status: Abnormal   Collection Time   01/19/12 10:40 PM      Component Value Range   Sodium 134 (*) 135 - 145 (mEq/L)   Potassium 3.6  3.5 - 5.1 (mEq/L)   Chloride 105  96 - 112 (mEq/L)   CO2 16 (*) 19 - 32 (mEq/L)   Glucose, Bld 144 (*) 70 - 99 (mg/dL)   BUN 11  6 - 23 (mg/dL)   Creatinine, Ser 0.45  0.50 - 1.10 (mg/dL)   Calcium 8.5  8.4 - 40.9 (mg/dL)   GFR calc non Af Amer >90  >90 (mL/min)   GFR calc Af Amer >90  >90  (mL/min)  GLUCOSE, CAPILLARY     Status: Abnormal   Collection Time   01/19/12 11:44 PM      Component Value Range   Glucose-Capillary 126 (*) 70 - 99 (mg/dL)   Comment 1 Notify RN     Comment 2 Documented in Chart    GLUCOSE, CAPILLARY     Status: Abnormal   Collection Time   01/20/12  2:06 AM      Component Value Range   Glucose-Capillary 39 (*) 70 - 99 (mg/dL)   Comment 1 Notify RN     Comment 2 Documented in Chart    GLUCOSE, CAPILLARY     Status: Abnormal   Collection Time   01/20/12  2:32 AM      Component Value Range   Glucose-Capillary 118 (*) 70 - 99 (mg/dL)   Comment 1 Notify RN     Comment 2 Documented in Chart    GLUCOSE, CAPILLARY     Status: Abnormal   Collection Time   01/20/12  3:28 AM      Component Value Range   Glucose-Capillary 63 (*) 70 - 99 (mg/dL)   Comment 1 Notify RN     Comment 2 Documented in Chart    GLUCOSE, CAPILLARY     Status: Abnormal   Collection Time   01/20/12  4:07 AM      Component Value Range   Glucose-Capillary 101 (*) 70 - 99 (mg/dL)   Comment 1 Notify RN     Comment 2 Documented in Chart    GLUCOSE, CAPILLARY     Status: Abnormal   Collection Time   01/20/12  6:19 AM      Component Value Range   Glucose-Capillary 62 (*) 70 - 99 (mg/dL)   Comment 1 Notify RN     Comment 2 Documented in Chart    GLUCOSE, CAPILLARY     Status: Abnormal   Collection Time   01/20/12  6:48 AM      Component Value Range   Glucose-Capillary 171 (*) 70 - 99 (mg/dL)  Comment 1 Notify RN     Comment 2 Documented in Chart    GLUCOSE, CAPILLARY     Status: Abnormal   Collection Time   01/20/12  8:02 AM      Component Value Range   Glucose-Capillary 102 (*) 70 - 99 (mg/dL)   Comment 1 Notify RN     Comment 2 Documented in Chart    BASIC METABOLIC PANEL     Status: Abnormal   Collection Time   01/20/12 10:51 AM      Component Value Range   Sodium 133 (*) 135 - 145 (mEq/L)   Potassium 3.6  3.5 - 5.1 (mEq/L)   Chloride 104  96 - 112 (mEq/L)   CO2 17 (*) 19 - 32  (mEq/L)   Glucose, Bld 258 (*) 70 - 99 (mg/dL)   BUN 9  6 - 23 (mg/dL)   Creatinine, Ser 1.61  0.50 - 1.10 (mg/dL)   Calcium 8.6  8.4 - 09.6 (mg/dL)   GFR calc non Af Amer >90  >90 (mL/min)   GFR calc Af Amer >90  >90 (mL/min)  GLUCOSE, CAPILLARY     Status: Abnormal   Collection Time   01/20/12 11:53 AM      Component Value Range   Glucose-Capillary 281 (*) 70 - 99 (mg/dL)   Comment 1 Notify RN     Comment 2 Documented in Chart      Micro Results: No results found for this or any previous visit (from the past 240 hour(s)).  Medications:  Scheduled Meds:   . doxycycline (VIBRAMYCIN) IV  100 mg Intravenous Q12H  . enoxaparin (LOVENOX) injection  40 mg Subcutaneous Q24H  . insulin aspart  0-15 Units Subcutaneous TID WC  . insulin aspart  8 Units Subcutaneous TID WC  . insulin glargine  30 Units Subcutaneous QHS  . insulin regular      . loratadine  10 mg Oral Daily  . potassium chloride  10 mEq Intravenous Q1H  . potassium chloride  10 mEq Intravenous Q1H  . DISCONTD: enoxaparin  30 mg Subcutaneous Q24H  . DISCONTD: insulin aspart  0-15 Units Subcutaneous TID WC   Continuous Infusions:   . insulin (NOVOLIN-R) infusion    . DISCONTD: sodium chloride Stopped (01/19/12 1335)  . DISCONTD: sodium chloride 150 mL/hr at 01/19/12 1721  . DISCONTD: sodium chloride 125 mL/hr at 01/20/12 0028  . DISCONTD: dextrose 5 % and 0.45% NaCl 200 mL/hr at 01/19/12 1736  . DISCONTD: dextrose 5 % and 0.45% NaCl 125 mL/hr at 01/20/12 0439  . DISCONTD: insulin (NOVOLIN-R) infusion 1.1 Units/hr (01/19/12 1454)   PRN Meds:.dextrose  Assessment/Plan: DKA - pt off IV insulin infusion now.  Transitioned to Lantus/Novolog.  I spoke with diabetes coordinator, will send pt home on relion 70/30 insulin from [facility] and pt will receive voucher for free 30 day supply of the 70/30 insulin.  This should last until she can get Medicaid benefits in July.   If she doesn't get the Medicaid, she should have a much  easier time being able to afford the 70/30 insulin versus lantus and novolog which she cannot afford with no insurance.  Pt has long history of poor compliance and is at high risk for acute and chronic complications of poorly controlled type 1 Diabetes mellitus.  I explained this to patient and she verbalized understanding.    Dental Abscess - I will switch the doxycycline to p.o.  Dental pain is improving after antibiotics.  Pt  will need to see a dentist as soon as she can get her Medicaid benefits.    Allergic Rhinitis - continue loratadine to control symptoms   LOS: 1 day   Kendel Pesnell 01/20/2012, 12:31 PM   Cleora Fleet, MD, CDE, FAAFP [facility] [place], [place]  [phone]

## 2012-01-21 DIAGNOSIS — R112 Nausea with vomiting, unspecified: Secondary | ICD-10-CM

## 2012-01-21 DIAGNOSIS — E1010 Type 1 diabetes mellitus with ketoacidosis without coma: Secondary | ICD-10-CM

## 2012-01-21 DIAGNOSIS — E1165 Type 2 diabetes mellitus with hyperglycemia: Secondary | ICD-10-CM

## 2012-01-21 DIAGNOSIS — K122 Cellulitis and abscess of mouth: Secondary | ICD-10-CM

## 2012-01-21 DIAGNOSIS — E118 Type 2 diabetes mellitus with unspecified complications: Secondary | ICD-10-CM

## 2012-01-21 LAB — GLUCOSE, CAPILLARY
Glucose-Capillary: 375 mg/dL — ABNORMAL HIGH (ref 70–99)
Glucose-Capillary: 211 mg/dL — ABNORMAL HIGH (ref 70–99)
Glucose-Capillary: 41 mg/dL — CL (ref 70–99)
Glucose-Capillary: 72 mg/dL (ref 70–99)

## 2012-01-21 LAB — BASIC METABOLIC PANEL
BUN: 8 mg/dL (ref 6–23)
CO2: 21 mEq/L (ref 19–32)
Calcium: 9.1 mg/dL (ref 8.4–10.5)
Creatinine, Ser: 0.51 mg/dL (ref 0.50–1.10)
GFR calc Af Amer: >90 mL/min (ref >90)

## 2012-01-21 MED ORDER — DOXYCYCLINE HYCLATE 100 MG PO TABS: 100.0000 mg | ORAL_TABLET | Freq: Two times a day (BID) | ORAL | Status: AC

## 2012-01-21 MED ORDER — INSULIN NPH ISOPHANE & REGULAR (70-30) 100 UNIT/ML ~~LOC~~ SUSP: 30.0000 [IU] | Freq: Two times a day (BID) | SUBCUTANEOUS | Status: DC

## 2012-01-21 NOTE — Discharge Summary (Signed)
Physician Discharge Summary  Patient ID: Beverly Wells MRN: [id number] DOB/AGE: 02/03/1991 21 y.o.  Admit date: 01/19/2012 Discharge date: 01/21/2012  Admission Diagnoses: Diabetic Ketoacidosis Dental Abscess Dental Caries Gingivitis  Discharge Diagnoses:   *DKA (diabetic ketoacidoses) - resolved  IDDM - Type 1 DM, poorly controlled with A1c 13%   ALLERGIC RHINITIS  PERS HX NONCOMPLIANCE W/MED TX PRS HAZARDS HLTH  Dental abscess  Discharged Condition: good  Hospital Course:  Diabetic Ketoacidosis - Pt was treated with IV insulin infusion (Glucommander Protocol).  Electrolytes were monitored and replaced.  When her AG corrected to normal, she was transitioned to Lantus and Novolog.  She was seen by the diabetes coordinator and arrangements were made for pt to get a voucher for a free 30 day supply of Relion insulin 70/30.  She will be discharged home on this.  She will continue to work on getting her Medicaid benefits.  She says that she should have it in July 2013.  She was seen by care manager for assistance with this. She was seen by dietician.  She was also started on doxycycline for treatment of the dental abscess and given instructions to see the dentist as soon as possible  Dental Abscess - I called and spoke with the dentist, they do not come to see pts in the hospital.  He said that treating pt with antibiotics is reasonable and she should follow up outpatient for further treatment.  Pt had improvement in symptoms after antibiotics.  She was given instructions to please see a dentist ASAP after discharge.  She said she would after getting her Medicaid benefits next month.  I told pt that if it is an emergency situation with her tooth, to go to the ER.  Pt verbalized understanding.     Discharge Exam: Pt says that she feels much better.  I explained to her how to obtain free insulin via the voucher provided and how to continue to get insulin from [facility].  A prescription is not  required.  She verbalized understanding.  Says tooth feels better.   Blood pressure 109/64, pulse 87, temperature 98 F (36.7 C), temperature source Oral, resp. rate 18, height 5\' 7"  (1.702 m), weight 68.04 kg (150 lb), last menstrual period 01/12/2012, SpO2 100.00%. General - awake, alert, NAD Heent- NCAT, Dental caries, gingivitis Lungs - BBS CTA CV - Normal s1, s2 sounds Abd - soft, normal BS, nD, nT Ext - no CCE  Disposition: 01-Home or Self Care  Discharge Orders    Future Orders Please Complete By Expires   Increase activity slowly      Discharge instructions      Comments:   Take 70/30 insulin - 30 units with breakfast and 30 units with supper. You must eat lunch everyday to avoid having a low BS at lunchtime.  Check BS 4 times per day.  Follow up with your diabetes doctor as soon as possible.   Continue to work on getting your Medicaid benefits set up. Return if symptoms recur, worsen or new problems develop.   Please see a dentist as soon as possible.        Medication List  As of 01/21/2012  8:08 AM   STOP taking these medications         insulin aspart 100 UNIT/ML injection      insulin detemir 100 UNIT/ML injection      insulin glargine 100 UNIT/ML injection         TAKE these medications  cetirizine 10 MG tablet   Commonly known as: ZYRTEC   Take 10 mg by mouth daily.      doxycycline 100 MG tablet   Commonly known as: VIBRA-TABS   Take 1 tablet (100 mg total) by mouth every 12 (twelve) hours. Take with food, avoid excessive sun exposure      insulin NPH-insulin regular (70-30) 100 UNIT/ML injection   Commonly known as: NOVOLIN 70/30   Inject 30 Units into the skin 2 (two) times daily with a meal. Take 30 units with breakfast and take 30 units with supper.  You must eat lunch everyday to avoid having a low BS around lunch time.  Check blood sugar 4 times per day.           Follow-up Information    Follow up with Romero Belling, MD. Schedule an  appointment as soon as possible for a visit in 1 week.   Contact information:   [address] [facility] [address] [place] [address] [phone]         I spent 37 mins preparing discharge, counseling patient and arranging follow up care, prescriptions, etc.   Signed: Damyiah Moxley 01/21/2012, 8:08 AM

## 2012-01-21 NOTE — Progress Notes (Signed)
Patient discharging to home with brother. Capable of re verbalizing medications, follow up instructions. Patient is hemodynamically stable.  Pt refuses wheelchair and assists at discharge.

## 2012-01-21 NOTE — Progress Notes (Signed)
CBG: 41   Treatment: 15 GM carbohydrate snack  Symptoms: None  Follow-up CBG: Time:1201 CBG Result:72  Possible Reasons for Event: Unknown  Comments/MD notified:MD, Dr. Laural Benes made aware. OK to hold noon meal coverage and sliding scale    Nakul Avino, Lucile Crater

## 2012-01-21 NOTE — Discharge Instructions (Signed)
Blood Sugar Monitoring, Adult GLUCOSE METERS FOR SELF-MONITORING OF BLOOD GLUCOSE  It is important to be able to correctly measure your blood sugar (glucose). You can use a blood glucose monitor (a small battery-operated device) to check your glucose level at any time. This allows you and your caregiver to monitor your diabetes and to determine how well your treatment plan is working. The process of monitoring your blood glucose with a glucose meter is called self-monitoring of blood glucose (SMBG). When people with diabetes control their blood sugar, they have better health. To test for glucose with a typical glucose meter, place the disposable strip in the meter. Then place a small sample of blood on the "test strip." The test strip is coated with chemicals that combine with glucose in blood. The meter measures how much glucose is present. The meter displays the glucose level as a number. Several new models can record and store a number of test results. Some models can connect to personal computers to store test results or print them out.  Newer meters are often easier to use than older models. Some meters allow you to get blood from places other than your fingertip. Some new models have automatic timing, error codes, signals, or barcode readers to help with proper adjustment (calibration). Some meters have a large display screen or spoken instructions for people with visual impairments.  INSTRUCTIONS FOR USING GLUCOSE METERS  Wash your hands with soap and warm water, or clean the area with alcohol. Dry your hands completely.   Prick the side of your fingertip with a lancet (a sharp-pointed tool used by hand).   Hold the hand down and gently milk the finger until a small drop of blood appears. Catch the blood with the test strip.   Follow the instructions for inserting the test strip and using the SMBG meter. Most meters require the meter to be turned on and the test strip to be inserted before  applying the blood sample.   Record the test result.   Read the instructions carefully for both the meter and the test strips that go with it. Meter instructions are found in the user manual. Keep this manual to help you solve any problems that may arise. Many meters use "error codes" when there is a problem with the meter, the test strip, or the blood sample on the strip. You will need the manual to understand these error codes and fix the problem.   New devices are available such as laser lancets and meters that can test blood taken from "alternative sites" of the body, other than fingertips. However, you should use standard fingertip testing if your glucose changes rapidly. Also, use standard testing if:   You have eaten, exercised, or taken insulin in the past 2 hours.   You think your glucose is low.   You tend to not feel symptoms of low blood glucose (hypoglycemia).   You are ill or under stress.   Clean the meter as directed by the manufacturer.   Test the meter for accuracy as directed by the manufacturer.   Take your meter with you to your caregiver's office. This way, you can test your glucose in front of your caregiver to make sure you are using the meter correctly. Your caregiver can also take a sample of blood to test using a routine lab method. If values on the glucose meter are close to the lab results, you and your caregiver will see that your meter is working well  and you are using good technique. Your caregiver will advise you about what to do if the results do not match.  FREQUENCY OF TESTING  Your caregiver will tell you how often you should check your blood glucose. This will depend on your type of diabetes, your current level of diabetes control, and your types of medicines. The following are general guidelines, but your care plan may be different. Record all your readings and the time of day you took them for review with your caregiver.   Diabetes type 1.   When you  are using insulin with good diabetic control (either multiple daily injections or via a pump), you should check your glucose 4 times a day.   If your diabetes is not well controlled, you may need to monitor more frequently, including before meals and 2 hours after meals, at bedtime, and occasionally between 2 a.m. and 3 a.m.   You should always check your glucose before a dose of insulin or before changing the rate on your insulin pump.   Diabetes type 2.   Guidelines for SMBG in diabetes type 2 are not as well defined.   If you are on insulin, follow the guidelines above.   If you are on medicines, but not insulin, and your glucose is not well controlled, you should test at least twice daily.   If you are not on insulin, and your diabetes is controlled with medicines or diet alone, you should test at least once daily, usually before breakfast.   A weekly profile will help your caregiver advise you on your care plan. The week before your visit, check your glucose before a meal and 2 hours after a meal at least daily. You may want to test before and after a different meal each day so you and your caregiver can tell how well controlled your blood sugars are throughout the course of a 24 hour period.   Gestational diabetes (diabetes during pregnancy).   Frequent testing is often necessary. Accurate timing is important.   If you are not on insulin, check your glucose 4 times a day. Check it before breakfast and 1 hour after the start of each meal.   If you are on insulin, check your glucose 6 times a day. Check it before each meal and 1 hour after the first bite of each meal.   General guidelines.   More frequent testing is required at the start of insulin treatment. Your caregiver will instruct you.   Test your glucose any time you suspect you have low blood sugar (hypoglycemia).   You should test more often when you change medicines, when you have unusual stress or illness, or in other  unusual circumstances.  OTHER THINGS TO KNOW ABOUT GLUCOSE METERS  Measurement Range. Most glucose meters are able to read glucose levels over a broad range of values from as low as 0 to as high as 600 mg/dL. If you get an extremely high or low reading from your meter, you should first confirm it with another reading. Report very high or very low readings to your caregiver.   Whole Blood Glucose versus Plasma Glucose. Some older home glucose meters measure glucose in your whole blood. In a lab or when using some newer home glucose meters, the glucose is measured in your plasma (one component of blood). The difference can be important. It is important for you and your caregiver to know whether your meter gives its results as "whole blood equivalent" or "plasma  equivalent."   Display of High and Low Glucose Values. Part of learning how to operate a meter is understanding what the meter results mean. Know how high and low glucose concentrations are displayed on your meter.   Factors that Affect Glucose Meter Performance. The accuracy of your test results depends on many factors and varies depending on the brand and type of meter. These factors include:   Low red blood cell count (anemia).   Substances in your blood (such as uric acid, vitamin C, and others).   Environmental factors (temperature, humidity, altitude).   Name-brand versus generic test strips.   Calibration. Make sure your meter is set up properly. It is a good idea to do a calibration test with a control solution recommended by the manufacturer of your meter whenever you begin using a fresh bottle of test strips. This will help verify the accuracy of your meter.   Improperly stored, expired, or defective test strips. Keep your strips in a dry place with the lid on.   Soiled meter.   Inadequate blood sample.  NEW TECHNOLOGIES FOR GLUCOSE TESTING Alternative site testing Some glucose meters allow testing blood from alternative  sites. These include the:  Upper arm.   Forearm.   Base of the thumb.   Thigh.  Sampling blood from alternative sites may be desirable. However, it may have some limitations. Blood in the fingertips show changes in glucose levels more quickly than blood in other parts of the body. This means that alternative site test results may be different from fingertip test results, not because of the meter's ability to test accurately, but because the actual glucose concentration can be different.  Continuous Glucose Monitoring Devices to measure your blood glucose continuously are available, and others are in development. These methods can be more expensive than self-monitoring with a glucose meter. However, it is uncertain how effective and reliable these devices are. Your caregiver will advise you if this approach makes sense for you. IF BLOOD SUGARS ARE CONTROLLED, PEOPLE WITH DIABETES REMAIN HEALTHIER.  SMBG is an important part of the treatment plan of patients with diabetes mellitus. Below are reasons for using SMBG:   It confirms that your glucose is at a specific, healthy level.   It detects hypoglycemia and severe hyperglycemia.   It allows you and your caregiver to make adjustments in response to changes in lifestyle for individuals requiring medicine.   It determines the need for starting insulin therapy in temporary diabetes that happens during pregnancy (gestational diabetes).  Document Released: 08/05/2003 Document Revised: 07/22/2011 Document Reviewed: 11/26/2010 [facility] Patient Information 2012 [place], [place].  Diabetes and Foot Care Diabetes may cause you to have a poor blood supply (circulation) to your legs and feet. Because of this, the skin may be thinner, break easier, and heal more slowly. You also may have nerve damage in your legs and feet causing decreased feeling. You may not notice minor injuries to your feet that could lead to serious problems or infections. Taking care  of your feet is one of the most important things you can do for yourself.  HOME CARE INSTRUCTIONS  Do not go barefoot. Bare feet are easily injured.   Check your feet daily for blisters, cuts, and redness.   Wash your feet with warm water (not hot) and mild soap. Pat your feet and between your toes until completely dry.   Apply a moisturizing lotion that does not contain alcohol or petroleum jelly to the dry skin on your  feet and to dry brittle toenails. Do not put it between your toes.   Trim your toenails straight across. Do not dig under them or around the cuticle.   Do not cut corns or calluses, or try to remove them with medicine.   Wear clean cotton socks or stockings every day. Make sure they are not too tight. Do not wear knee high stockings since they may decrease blood flow to your legs.   Wear leather shoes that fit properly and have enough cushioning. To break in new shoes, wear them just a few hours a day to avoid injuring your feet.   Wear shoes at all times, even in the house.   Do not cross your legs. This may decrease the blood flow to your feet.   If you find a minor scrape, cut, or break in the skin on your feet, keep it and the skin around it clean and dry. These areas may be cleansed with mild soap and water. Do not use peroxide, alcohol, iodine or Merthiolate.   When you remove an adhesive bandage, be sure not to harm the skin around it.   If you have a wound, look at it several times a day to make sure it is healing.   Do not use heating pads or hot water bottles. Burns can occur. If you have lost feeling in your feet or legs, you may not know it is happening until it is too late.   Report any cuts, sores or bruises to your caregiver. Do not wait!  SEEK MEDICAL CARE IF:   You have an injury that is not healing or you notice redness, numbness, burning, or tingling.   Your feet always feel cold.   You have pain or cramps in your legs and feet.  SEEK  IMMEDIATE MEDICAL CARE IF:   There is increasing redness, swelling, or increasing pain in the wound.   There is a red line that goes up your leg.   Pus is coming from a wound.   You develop an unexplained oral temperature above 102 F (38.9 C), or as your caregiver suggests.   You notice a bad smell coming from an ulcer or wound.  MAKE SURE YOU:   Understand these instructions.   Will watch your condition.   Will get help right away if you are not doing well or get worse.  Document Released: 07/30/2000 Document Revised: 07/22/2011 Document Reviewed: 02/05/2009 [facility] Patient Information 2012 [place], [place].  Diabetes and Sick Day Management Blood sugar (glucose) can be more difficult to control when you are sick. Colds, fever, flu, nausea, vomiting, and diarrhea are all examples of common illnesses that can cause problems for people with diabetes. Loss of body fluids (dehydration) from fever, vomiting, diarrhea, infection, and the stress of a sickness can all cause blood glucose levels to rise. Because of this, it is very important to take your diabetes medicines and to eat some form of carbohydrate food when you are sick. Liquid or soft foods are often tolerated, and they help to replace fluids. HOME CARE INSTRUCTIONS These main guidelines are intended for managing a short-term (24 hours or less) sickness:  Take your usual dose of insulin or oral diabetes medicine. An exception would be if you take any form of metformin. If you cannot eat or drink, you can become dehydrated and should not take this medicine.   Continue to take your insulin even if you are unable to eat solid foods or are vomiting.  Your insulin dose may stay the same, or it may need to be increased when you are sick.   You will need to test your blood glucose more often, generally every 2 to 4 hours. If you have type 1 diabetes, test your urine for ketones every 4 hours. If you have type 2 diabetes, test your  ketones as directed by your caregiver.   Eat some form of food that contains carbohydrates. The carbohydrates can be in solid or liquid form. You should eat 45 to 50 grams of carbohydrates every 3 to 4 hours.   Replace fluids if fever, vomiting, or diarrhea is present. Ask your caregiver for specific rehydration instructions.   Watch carefully for the signs of ketoacidosis if you have type 1 diabetes. Call your caregiver if any of the following symptoms are present, especially in children:   Moderate to large ketones in the urine along with a high blood glucose level.   Severe nausea.   Vomiting.   Diarrhea.   Abdominal pain.   Rapid breathing.   Drink extra liquids that do not contain sugar, such as water or sugar-free liquids, if your blood glucose is higher than 240 mg/dl.   Be careful with over-the-counter medicines. Read the labels. They may contain sugar or types of sugars that can raise your blood glucose.  Food Choices for Illness All of the food choices below contain around 15 grams of carbohydrates. Plan ahead and keep some of these foods around.    to  cup carbonated beverage containing sugar. Carbonated beverages will usually be better tolerated if they are opened and left at room temperature for a few minutes.    of a twin frozen ice pop.    cup sweetened gelatin dessert.    cup juice.    cup ice cream or frozen yogurt.    cup cooked cereal.    cup sherbet.   1 cup broth-based soup with noodles or rice, reconstituted with water.   1 cup cream soup.    cup regular custard.    cup regular pudding.   1 cup sports drink.   1 cup plain yogurt.   1 slice toast.   6 squares saltine crackers.   5 vanilla wafers.  SEEK MEDICAL CARE IF:   You are sick and see no improvement in 6 to 8 hours.   You are unable to eat regular foods for more than 24 hours.   You have blood glucose readings over 240 mg/dl, 2 times in a row.   Your blood glucose  is less than 70 mg/dl, 2 times in a row.   You are not sure what to do to take care of yourself.   You vomit more than once in 4 to 6 hours.   You have moderate or large ketones in your urine.   You have a fever.   Your symptoms are getting worse even though you are following your caregiver's instructions.  Document Released: 08/05/2003 Document Revised: 07/22/2011 Document Reviewed: 02/09/2011 [facility] Patient Information 2012 [place], [place].  Dental Abscess A dental abscess usually starts from an infected tooth. Antibiotic medicine and pain pills can be helpful, but dental infections require the attention of a dentist. Rinse around the infected area often with salt water (a pinch of salt in 8 oz of warm water). Do not apply heat to the outside of your face. See your dentist or oral surgeon as soon as possible.  SEEK IMMEDIATE MEDICAL CARE IF:  You have increasing,  severe pain that is not relieved by medicine.   You or your child has an oral temperature above 102 F (38.9 C), not controlled by medicine.   Your baby is older than 3 months with a rectal temperature of 102 F (38.9 C) or higher.   Your baby is 49 months old or younger with a rectal temperature of 100.4 F (38 C) or higher.   You develop chills, severe headache, difficulty breathing, or trouble swallowing.   You have swelling in the neck or around the eye.  Document Released: 08/02/2005 Document Revised: 07/22/2011 Document Reviewed: 01/11/2007 [facility] Patient Information 2012 [place], [place].  Preventive Dental Care Preventative dental care is an important part of overall health for children and adults. Regular care of the teeth and gums can prevent tooth decay and gum disease. Substances are created in your mouth every day that need to be removed, including:  Plaque. This is a sticky substance around the teeth and gums.   Tartar. This is a hardened form of plaque.  You can remove plaque and tartar by brushing  and flossing. Brushing and flossing will also help prevent cavities, gingivitis, and tooth loss. Your dentist can remove the plaque and tartar not removed with regular daily care. A dentist can also find other dental problems. Problems that are detected early can be treated conservatively and with less expense. BABIES, TODDLERS, AND PRESCHOOL AGED CHILDREN  Clean your baby's gums after feedings with a wet washcloth (water only).   Do not give your baby a bottle with milk, formula, or juice for sipping before they go to sleep.   Brush your baby's emerging teeth with a small toothbrush and water 2 times per day.   Begin flossing your baby's teeth when they are in contact with one another.   Many dental problems are preventable with early assessment. Take your child to a dentist when the first tooth erupts or by age 43. Discuss risks for tooth decay, your child's growth and development, fluoride needs, oral habits, proper diet, and oral hygiene.   Bring your child to the dentist every 6 months.   Even when your child is over age 27, brush your child's teeth for him or her. Use a small pea-sized amount of fluoride toothpaste. Make sure your child does not swallow the toothpaste.   Contact your dentist if your child has pain or other problems in the mouth.  SCHOOL AGED CHILDREN AND ADOLESCENTS  Continue to brush your child's teeth 2 times per day with a child toothbrush and pea-sized amount of toothpaste until your child is 79 to 63 years old.   Help your child floss until he or she can do it properly.   Bring your child to the dentist every 6 months for professional cleanings and exams.   If your child is involved in contact sports, make sure he or she wears a properly fitted mouth guard.   Ask your dentist about dental sealants. This is a coating painted onto the molars to prevent decay.    Encourage a healthy diet. Help your child limit sugary drinks and foods.   Make sure your child has  an early orthodontic evaluation. Your child should have an evaluation by about age 74. Many orthodontic problems can be treated early, preventing the need for full fixed braces in the future.   Talk to your adolescent about the risks of oral piercings and smoking. Help your child avoid these risks.   Contact your dental caregiver if your  child has pain or other problems in the mouth.  ADULTS  Brush at least 2 times per day and after meals if possible. Brush for at least 2 minutes.   Use a fluoride toothpaste or drink fluoridated water.   Floss daily.   Keep retainers, dentures, or other mouth devices clean and sanitized. Soak or brush them as directed.   Eat a healthy diet. Limit sugary drinks and foods.   See your dentist every 6 months.   Follow up with your dentist as directed.   Always see your dentist at the first sign of tooth or gum pain.   Avoid smoking.   Limit alcohol.  ELDERLY OR THOSE WITH A CHRONIC HEALTH CONDITION Follow the adult guidelines above, and in addition:  Manage chronic conditions, such as diabetes. Certain conditions can increase your risk of gum disease.   If you experience dry mouth from medicines, ask your caregiver about treatment options. Try drinking more water and chewing sugarless gum. Avoid alcohol.   Visit your dentist prior to cancer treatment to take care of any problems.  SEEK IMMEDIATE DENTAL CARE IF:  You develop pain, bleeding, or soreness in the gum, tooth, jaw, or mouth area.   A permanent tooth becomes loose or separated from the gum socket.   You experience a blow or injury to the mouth or jaw area.  Document Released: 11/27/2010 Document Revised: 07/22/2011 Document Reviewed: 11/27/2010 [facility] Patient Information 2012 [place], [place].

## 2012-04-22 ENCOUNTER — Encounter (HOSPITAL_BASED_OUTPATIENT_CLINIC_OR_DEPARTMENT_OTHER): Payer: Self-pay | Admitting: *Deleted

## 2012-04-22 ENCOUNTER — Emergency Department (HOSPITAL_BASED_OUTPATIENT_CLINIC_OR_DEPARTMENT_OTHER)
Admission: EM | Admit: 2012-04-22 | Discharge: 2012-04-22 | Disposition: A | Discharge status: Home / Self Care | Payer: Medicaid Other | Attending: Emergency Medicine | Admitting: Emergency Medicine

## 2012-04-22 DIAGNOSIS — S058X9A Other injuries of unspecified eye and orbit, initial encounter: Secondary | ICD-10-CM

## 2012-04-22 DIAGNOSIS — H18829 Corneal disorder due to contact lens, unspecified eye: Secondary | ICD-10-CM | POA: Insufficient documentation

## 2012-04-22 DIAGNOSIS — E119 Type 2 diabetes mellitus without complications: Secondary | ICD-10-CM | POA: Insufficient documentation

## 2012-04-22 DIAGNOSIS — Z794 Long term (current) use of insulin: Secondary | ICD-10-CM | POA: Insufficient documentation

## 2012-04-22 DIAGNOSIS — Z880 Allergy status to penicillin: Secondary | ICD-10-CM | POA: Insufficient documentation

## 2012-04-22 MED ORDER — FLUORESCEIN SODIUM 1 MG OP STRP
1.0000 | ORAL_STRIP | Freq: Once | OPHTHALMIC | Status: AC
  Administered 2012-04-22: 1 via OPHTHALMIC

## 2012-04-22 MED ORDER — TETRACAINE HCL 0.5 % OP SOLN
OPHTHALMIC | Status: AC
  Administered 2012-04-22: 1 [drp] via OPHTHALMIC
  Filled 2012-04-22: qty 2

## 2012-04-22 MED ORDER — FLUORESCEIN SODIUM 1 MG OP STRP
ORAL_STRIP | OPHTHALMIC | Status: AC
  Administered 2012-04-22: 1 via OPHTHALMIC
  Filled 2012-04-22: qty 1

## 2012-04-22 MED ORDER — HYDROCODONE-ACETAMINOPHEN 5-325 MG PO TABS: 1.0000 | ORAL_TABLET | Freq: Four times a day (QID) | ORAL | Status: AC | PRN

## 2012-04-22 MED ORDER — TETRACAINE HCL 0.5 % OP SOLN
1.0000 [drp] | Freq: Once | OPHTHALMIC | Status: AC
  Administered 2012-04-22: 1 [drp] via OPHTHALMIC

## 2012-04-22 MED ORDER — HYDROCODONE-ACETAMINOPHEN 5-325 MG PO TABS
1.0000 | ORAL_TABLET | Freq: Once | ORAL | Status: AC
  Administered 2012-04-22: 1 via ORAL
  Filled 2012-04-22: qty 1

## 2012-04-22 MED ORDER — ERYTHROMYCIN 5 MG/GM OP OINT
TOPICAL_OINTMENT | Freq: Four times a day (QID) | OPHTHALMIC | Status: DC
  Administered 2012-04-22: 20:00:00 via OPHTHALMIC
  Filled 2012-04-22: qty 3.5

## 2012-04-22 NOTE — ED Notes (Signed)
rx x 1 given for hydrocodone- erythromycin ointment given for home use- friend at bedside to drive

## 2012-04-22 NOTE — ED Notes (Signed)
MD at bedside. 

## 2012-04-22 NOTE — ED Provider Notes (Addendum)
History   This chart was scribed for Hanley Seamen, MD scribed by Magnus Sinning. The patient was seen in room MH10/MH10 at 19:22.   CSN: [id number]  Arrival date & time 04/22/12  1649    Chief Complaint  Patient presents with  . Eye Pain    (Consider location/radiation/quality/duration/timing/severity/associated sxs/prior treatment) HPI Beverly Wells is a 21 y.o. female who presents to the Emergency Department complaining of constant moderate left eye pain, onset this morning. Patient states she accidentally fell asleep in her contacts and when she woke up she had eye pain. Reports pain is aggravated with light. Patient does not report any other complaint at this time. She states her pain is 9/10.  Past Medical History  Diagnosis Date  . 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 ALLERGIC RHINITIS 05/07/2010  . DKA (diabetic ketoacidoses)     History reviewed. No pertinent past surgical history.  Family History  Problem Relation Age of Onset  . Diabetes Neg Hx     History  Substance Use Topics  . Smoking status: Never Smoker   . Smokeless tobacco: Not on file  . Alcohol Use: No   Review of Systems 10 Systems reviewed and are negative for acute change except as noted in the HPI Allergies  Banana; Orange concentrate; and Penicillins  Home Medications   Current Outpatient Rx  Name Route Sig Dispense Refill  . CETIRIZINE HCL 10 MG PO TABS Oral Take 10 mg by mouth daily.      . INSULIN ISOPHANE & REGULAR (70-30) 100 UNIT/ML [place] SUSP Subcutaneous Inject 30 Units into the skin 2 (two) times daily with a meal. Take 30 units with breakfast and 30 units with supper.  You must eat lunch everyday to avoid a low BS at lunchtime.  Check your BS 4 times per day. 15 mL 3    BP 125/74  Pulse 86  Temp 98.1 F (36.7 C) (Oral)  Resp 18  Ht 5\' 7"  (1.702 m)  Wt 150 lb (68.04 kg)  BMI 23.49 kg/m2  SpO2 98%  LMP 04/08/2012  Physical Exam    Nursing note and vitals reviewed. Constitutional: She is oriented to person, place, and time. She appears well-developed and well-nourished. No distress.  HENT:  Head: Normocephalic and atraumatic.  Eyes: Conjunctivae and EOM are normal. Pupils are equal, round, and reactive to light. Right eye exhibits no discharge. Left eye exhibits no discharge.       Eyes of nml appearance.No corneal abrasion seen on wood's lamp examination No pain or firmness of globe on palpation of eyelids.   Neck: Neck supple. No tracheal deviation present.  Cardiovascular: Normal rate.   Pulmonary/Chest: Effort normal. No respiratory distress.  Abdominal: Soft. She exhibits no distension.  Musculoskeletal: Normal range of motion. She exhibits no edema.  Neurological: She is alert and oriented to person, place, and time. No sensory deficit.  Skin: Skin is warm and dry.  Psychiatric: She has a normal mood and affect. Her behavior is normal.    ED Course  Procedures (including critical care time)  DIAGNOSTIC STUDIES: Oxygen Saturation is 99% on room air, normal by my interpretation.    COORDINATION OF CARE:  19:22: Physical exam performed 19:26: Tetracaine and Fluorescein placed in left eye. Physical Exam: No corneal abrasion seen on Wood's lamp examination and no pain or firmness of globe on gentle pressure of eyes through closed eyelids.      MDM  No corneal abrasions seen but symptoms are consistent with corneal abrasion. We will treat accordingly.  I personally performed the services described in this documentation, which was scribed in my presence.  The recorded information has been reviewed and considered.         Hanley Seamen, MD 04/22/12 1934  Hanley Seamen, MD 04/22/12 4098

## 2012-04-22 NOTE — ED Notes (Signed)
Pt sates she wears contacts and slept in them last night. (Not the kind you do that) Now c/o bilat eye pain. Sensitive to light. PERL

## 2012-04-23 IMAGING — CR DG CHEST 2V
2 series · 2 of 2 positions shown · non-contrast
Comparison: Chest radiograph 08/17/2010

CLINICAL DATA: Short of breath

CHEST - 2 VIEW

[w chest pa]
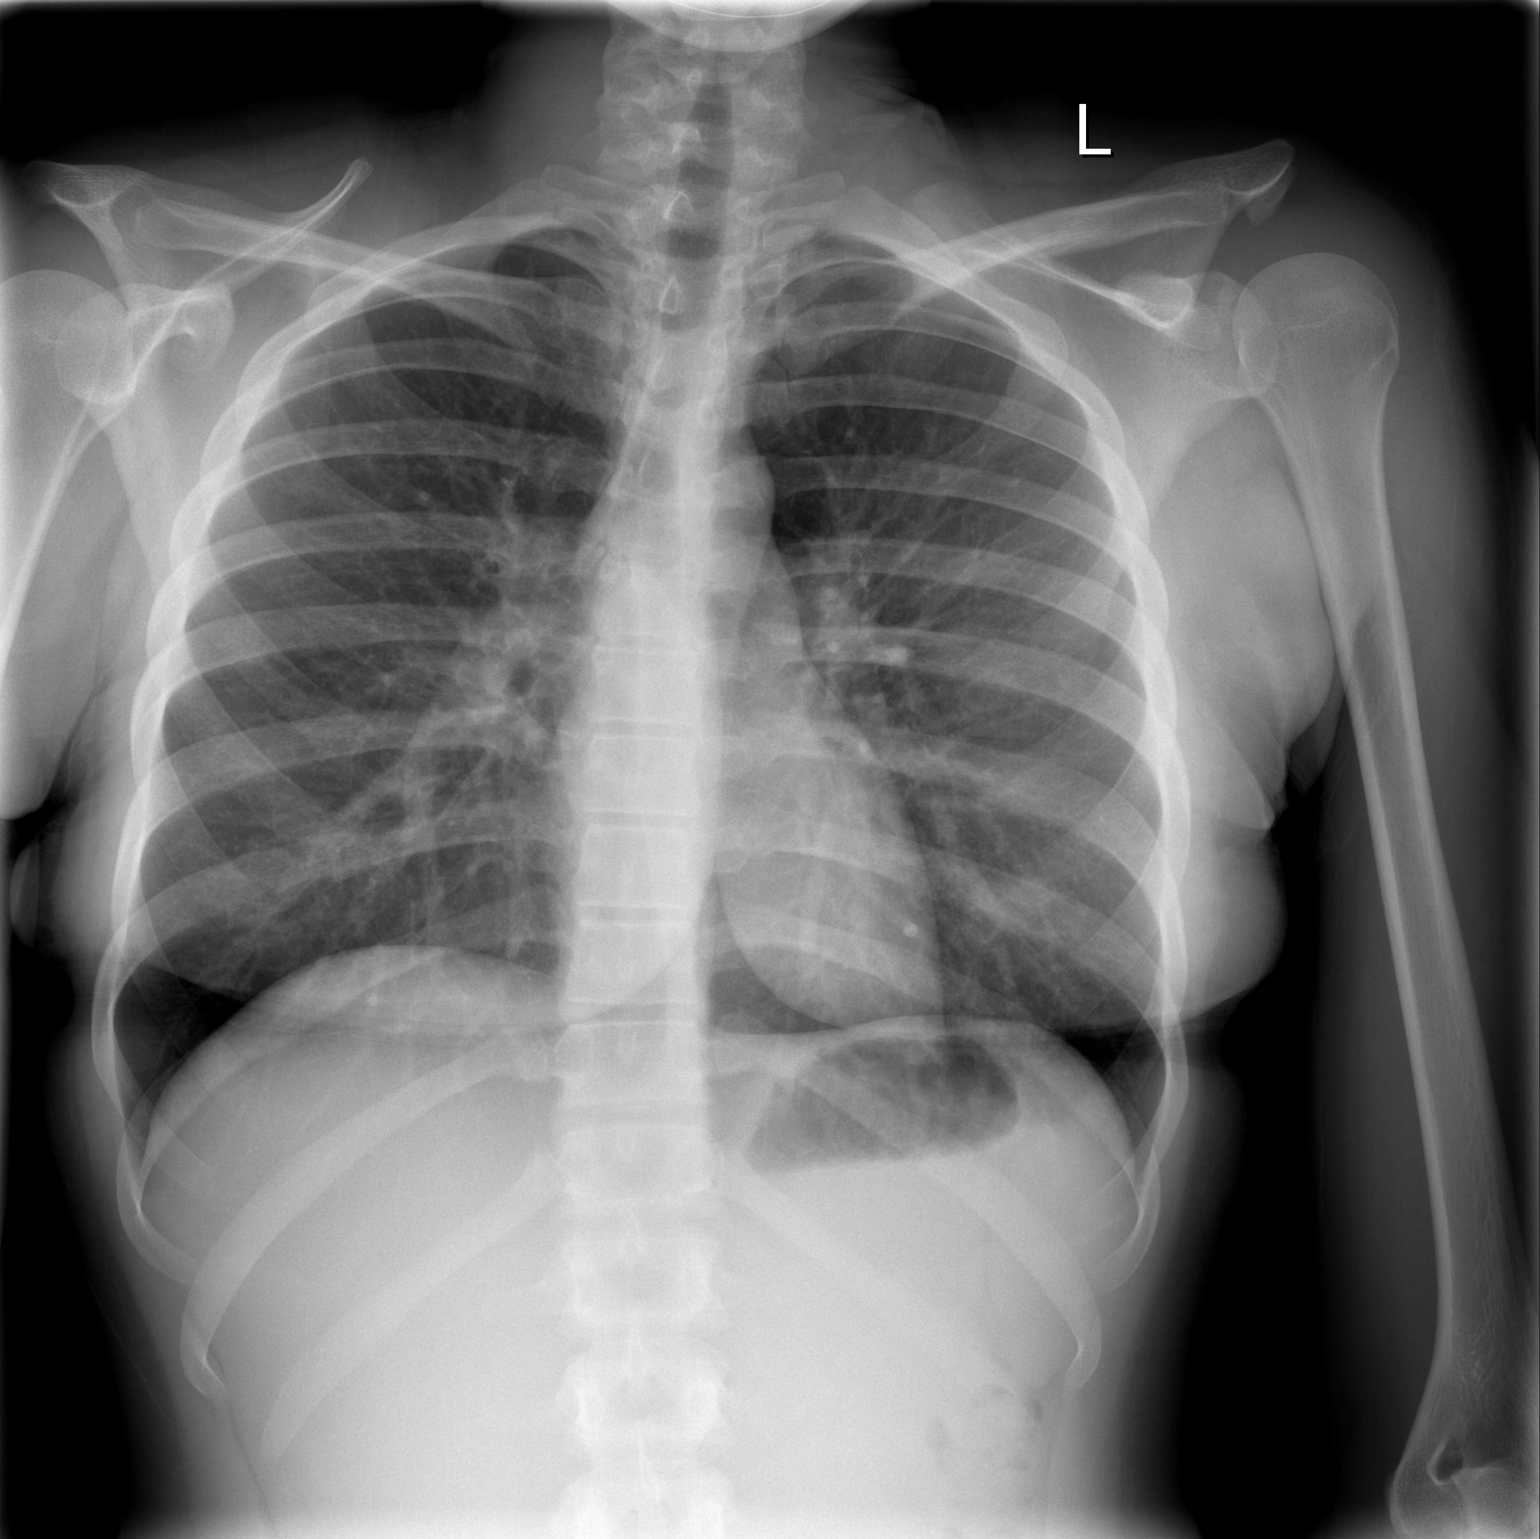

[w chest lat]
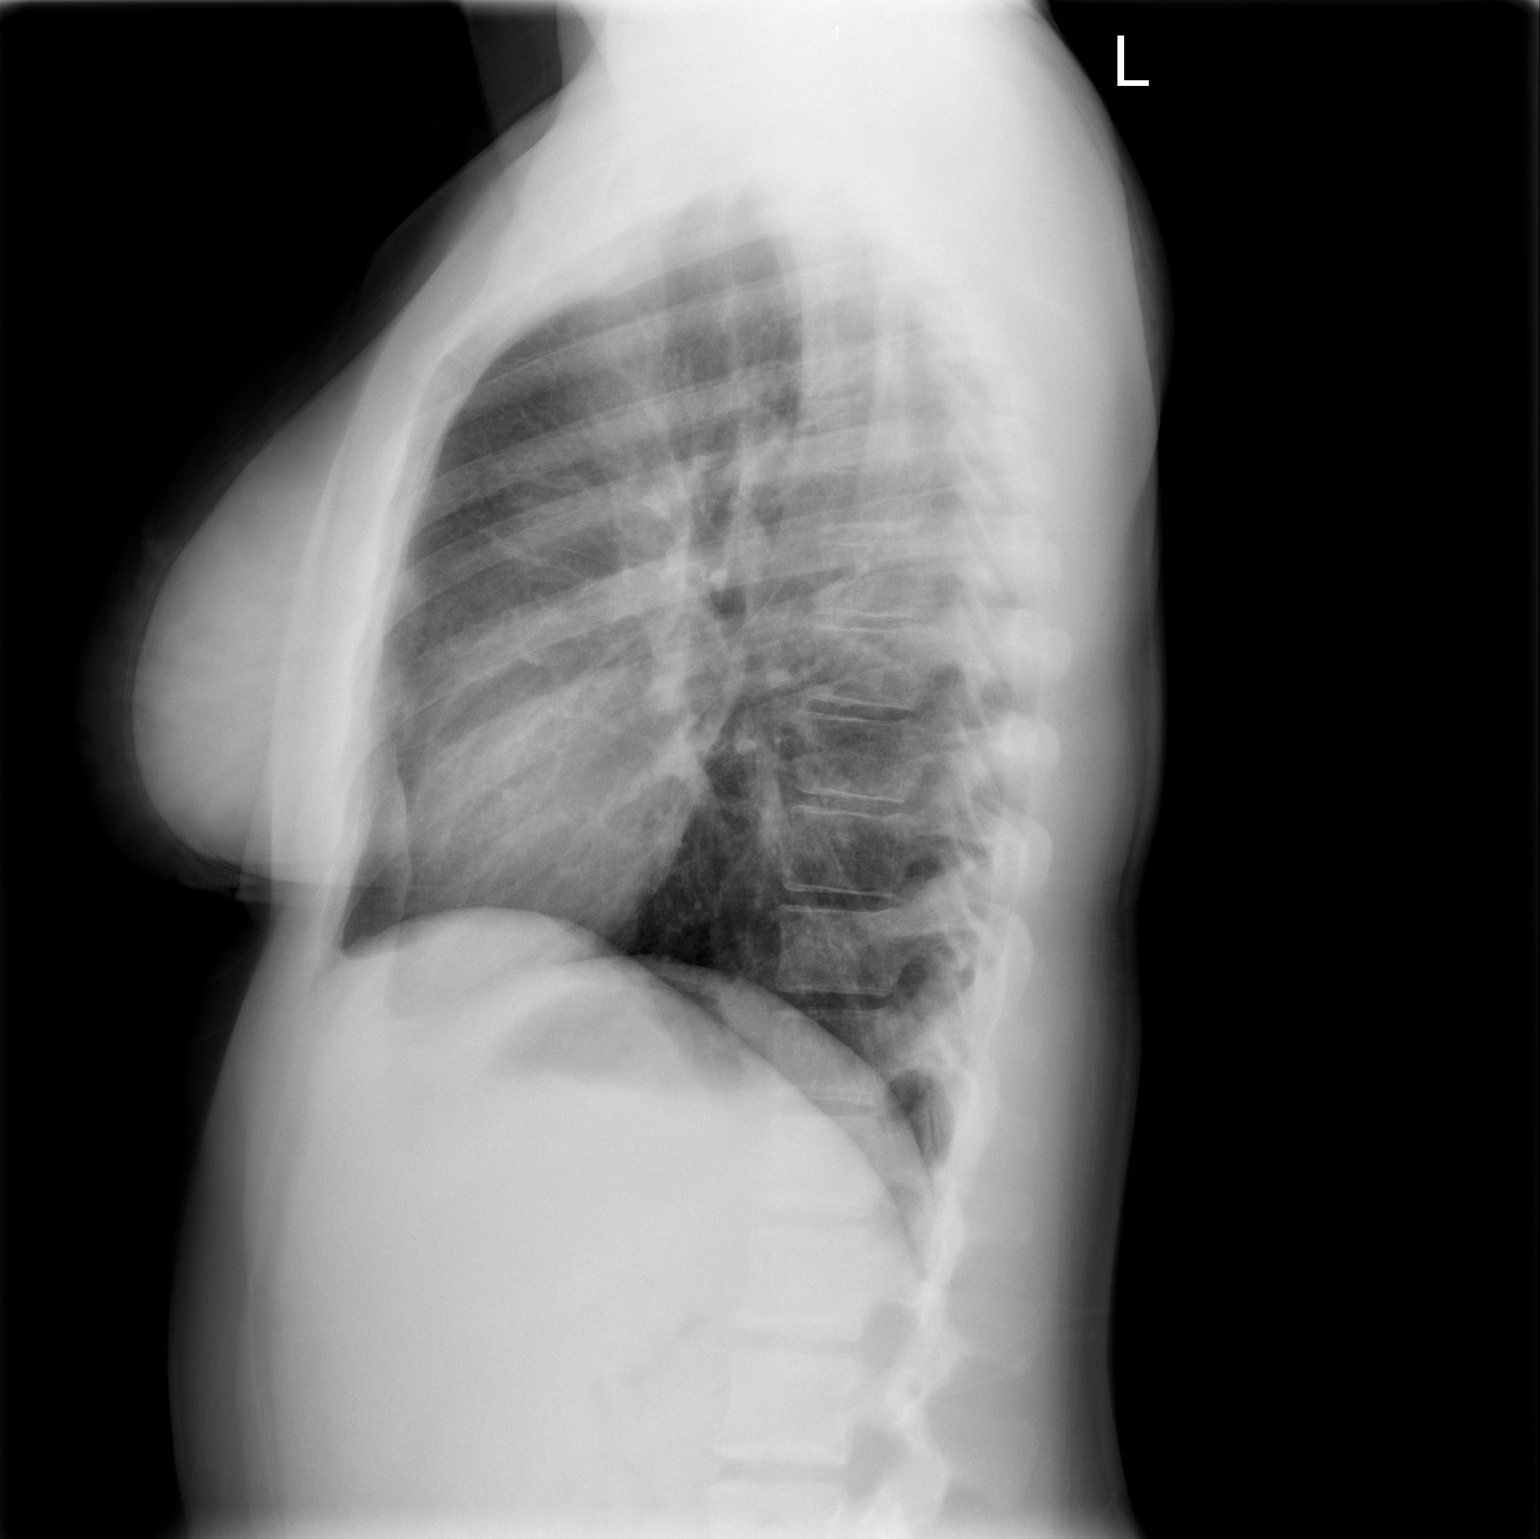

[2 of 2 positions shown; findings below may reference images not displayed]

FINDINGS: Normal mediastinum and cardiac silhouette.  Normal
pulmonary  vasculature.  No evidence of effusion, infiltrate, or
pneumothorax.  No acute bony abnormality.
IMPRESSION: Normal chest radiograph

## 2012-06-06 ENCOUNTER — Encounter (HOSPITAL_BASED_OUTPATIENT_CLINIC_OR_DEPARTMENT_OTHER): Payer: Self-pay | Admitting: *Deleted

## 2012-06-06 ENCOUNTER — Observation Stay (HOSPITAL_BASED_OUTPATIENT_CLINIC_OR_DEPARTMENT_OTHER)
Admission: EM | Admit: 2012-06-06 | Discharge: 2012-06-08 | Disposition: A | Discharge status: Home / Self Care | Payer: Medicaid Other | Attending: Internal Medicine | Admitting: Internal Medicine

## 2012-06-06 DIAGNOSIS — E1110 Type 2 diabetes mellitus with ketoacidosis without coma: Secondary | ICD-10-CM

## 2012-06-06 DIAGNOSIS — Z9119 Patient's noncompliance with other medical treatment and regimen: Secondary | ICD-10-CM

## 2012-06-06 DIAGNOSIS — J069 Acute upper respiratory infection, unspecified: Secondary | ICD-10-CM

## 2012-06-06 DIAGNOSIS — K047 Periapical abscess without sinus: Secondary | ICD-10-CM

## 2012-06-06 DIAGNOSIS — R918 Other nonspecific abnormal finding of lung field: Secondary | ICD-10-CM | POA: Insufficient documentation

## 2012-06-06 DIAGNOSIS — E109 Type 1 diabetes mellitus without complications: Secondary | ICD-10-CM

## 2012-06-06 DIAGNOSIS — Z794 Long term (current) use of insulin: Secondary | ICD-10-CM | POA: Insufficient documentation

## 2012-06-06 DIAGNOSIS — Z23 Encounter for immunization: Secondary | ICD-10-CM | POA: Insufficient documentation

## 2012-06-06 DIAGNOSIS — R112 Nausea with vomiting, unspecified: Secondary | ICD-10-CM | POA: Insufficient documentation

## 2012-06-06 DIAGNOSIS — R911 Solitary pulmonary nodule: Secondary | ICD-10-CM | POA: Diagnosis present

## 2012-06-06 DIAGNOSIS — R1110 Vomiting, unspecified: Secondary | ICD-10-CM

## 2012-06-06 DIAGNOSIS — J309 Allergic rhinitis, unspecified: Secondary | ICD-10-CM

## 2012-06-06 DIAGNOSIS — R109 Unspecified abdominal pain: Secondary | ICD-10-CM | POA: Insufficient documentation

## 2012-06-06 DIAGNOSIS — E1010 Type 1 diabetes mellitus with ketoacidosis without coma: Principal | ICD-10-CM | POA: Insufficient documentation

## 2012-06-06 LAB — GLUCOSE, CAPILLARY: Glucose-Capillary: 333 mg/dL — ABNORMAL HIGH (ref 70–99)

## 2012-06-06 MED ORDER — SODIUM CHLORIDE 0.9 % IV BOLUS (SEPSIS)
1000.0000 mL | Freq: Once | INTRAVENOUS | Status: AC
  Administered 2012-06-07: 1000 mL via INTRAVENOUS

## 2012-06-06 NOTE — ED Notes (Signed)
EMS reports pt c/o generalized abd pain since awakening this am, progressively got worse and pt began vomiting. Pt started her period today. EMS gave zofran 4mg  IV via a 20g in her right a/c.

## 2012-06-07 ENCOUNTER — Encounter (HOSPITAL_COMMUNITY): Payer: Self-pay | Admitting: General Practice

## 2012-06-07 ENCOUNTER — Emergency Department (HOSPITAL_BASED_OUTPATIENT_CLINIC_OR_DEPARTMENT_OTHER): Payer: Medicaid Other

## 2012-06-07 DIAGNOSIS — R1110 Vomiting, unspecified: Secondary | ICD-10-CM | POA: Diagnosis present

## 2012-06-07 DIAGNOSIS — R911 Solitary pulmonary nodule: Secondary | ICD-10-CM

## 2012-06-07 DIAGNOSIS — R109 Unspecified abdominal pain: Secondary | ICD-10-CM

## 2012-06-07 DIAGNOSIS — E1110 Type 2 diabetes mellitus with ketoacidosis without coma: Secondary | ICD-10-CM | POA: Diagnosis present

## 2012-06-07 LAB — BASIC METABOLIC PANEL
BUN: 12 mg/dL (ref 6–23)
BUN: 12 mg/dL (ref 6–23)
BUN: 11 mg/dL (ref 6–23)
CO2: 16 mEq/L — ABNORMAL LOW (ref 19–32)
CO2: 14 mEq/L — ABNORMAL LOW (ref 19–32)
CO2: 16 mEq/L — ABNORMAL LOW (ref 19–32)
Calcium: 9.6 mg/dL (ref 8.4–10.5)
Calcium: 8.6 mg/dL (ref 8.4–10.5)
Calcium: 8.6 mg/dL (ref 8.4–10.5)
Calcium: 8.4 mg/dL (ref 8.4–10.5)
Chloride: 102 mEq/L (ref 96–112)
Chloride: 104 mEq/L (ref 96–112)
Creatinine, Ser: 0.5 mg/dL (ref 0.50–1.10)
Creatinine, Ser: 0.57 mg/dL (ref 0.50–1.10)
Creatinine, Ser: 0.61 mg/dL (ref 0.50–1.10)
GFR calc Af Amer: >90 mL/min (ref >90)
GFR calc Af Amer: >90 mL/min (ref >90)
GFR calc Af Amer: >90 mL/min (ref >90)
GFR calc Af Amer: >90 mL/min (ref >90)
GFR calc Af Amer: >90 mL/min (ref >90)
GFR calc Af Amer: >90 mL/min (ref >90)
GFR calc non Af Amer: >90 mL/min (ref >90)
GFR calc non Af Amer: >90 mL/min (ref >90)
GFR calc non Af Amer: >90 mL/min (ref >90)
GFR calc non Af Amer: >90 mL/min (ref >90)
GFR calc non Af Amer: >90 mL/min (ref >90)
Glucose, Bld: 352 mg/dL — ABNORMAL HIGH (ref 70–99)
Glucose, Bld: 272 mg/dL — ABNORMAL HIGH (ref 70–99)
Potassium: 4.1 mEq/L (ref 3.5–5.1)
Potassium: 4.1 mEq/L (ref 3.5–5.1)
Potassium: 4.5 mEq/L (ref 3.5–5.1)
Potassium: 3.8 mEq/L (ref 3.5–5.1)
Sodium: 136 mEq/L (ref 135–145)
Sodium: 136 mEq/L (ref 135–145)
Sodium: 134 mEq/L — ABNORMAL LOW (ref 135–145)
Sodium: 136 mEq/L (ref 135–145)

## 2012-06-07 LAB — GLUCOSE, CAPILLARY
Glucose-Capillary: 163 mg/dL — ABNORMAL HIGH (ref 70–99)
Glucose-Capillary: 174 mg/dL — ABNORMAL HIGH (ref 70–99)
Glucose-Capillary: 189 mg/dL — ABNORMAL HIGH (ref 70–99)
Glucose-Capillary: 215 mg/dL — ABNORMAL HIGH (ref 70–99)
Glucose-Capillary: 227 mg/dL — ABNORMAL HIGH (ref 70–99)
Glucose-Capillary: 234 mg/dL — ABNORMAL HIGH (ref 70–99)
Glucose-Capillary: 245 mg/dL — ABNORMAL HIGH (ref 70–99)
Glucose-Capillary: 246 mg/dL — ABNORMAL HIGH (ref 70–99)
Glucose-Capillary: 257 mg/dL — ABNORMAL HIGH (ref 70–99)
Glucose-Capillary: 292 mg/dL — ABNORMAL HIGH (ref 70–99)
Glucose-Capillary: 95 mg/dL (ref 70–99)

## 2012-06-07 LAB — LIPASE, BLOOD: Lipase: 7 U/L — ABNORMAL LOW (ref 11–59)

## 2012-06-07 LAB — CBC WITH DIFFERENTIAL/PLATELET
Basophils Absolute: 0 10*3/uL (ref 0.0–0.1)
Eosinophils Absolute: 0.1 10*3/uL (ref 0.0–0.7)
Eosinophils Relative: 1 % (ref 0–5)
HCT: 37.4 % (ref 36.0–46.0)
Lymphocytes Relative: 12 % (ref 12–46)
Lymphs Abs: 0.9 10*3/uL (ref 0.7–4.0)
MCH: 23.8 pg — ABNORMAL LOW (ref 26.0–34.0)
MCV: 71.1 fL — ABNORMAL LOW (ref 78.0–100.0)
Monocytes Absolute: 0.3 10*3/uL (ref 0.1–1.0)
Platelets: 281 10*3/uL (ref 150–400)
RDW: 14.4 % (ref 11.5–15.5)

## 2012-06-07 LAB — URINALYSIS, ROUTINE W REFLEX MICROSCOPIC
Bilirubin Urine: NEGATIVE
Glucose, UA: >1000 mg/dL — AB
Specific Gravity, Urine: 1.035 — ABNORMAL HIGH (ref 1.005–1.030)
Urobilinogen, UA: 0.2 mg/dL (ref 0.0–1.0)

## 2012-06-07 LAB — CBC
Hemoglobin: 12.7 g/dL (ref 12.0–15.0)
MCHC: 32.2 g/dL (ref 30.0–36.0)
RBC: 5.28 MIL/uL — ABNORMAL HIGH (ref 3.87–5.11)

## 2012-06-07 LAB — POCT I-STAT 3, ART BLOOD GAS (G3+)
Acid-base deficit: 7 mmol/L — ABNORMAL HIGH (ref 0.0–2.0)
O2 Saturation: 98 %
TCO2: 18 mmol/L (ref 0–100)
pO2, Arterial: 117 mmHg — ABNORMAL HIGH (ref 80.0–100.0)

## 2012-06-07 LAB — URINE MICROSCOPIC-ADD ON

## 2012-06-07 MED ORDER — ONDANSETRON HCL 4 MG/2ML IJ SOLN: 4.0000 mg | Freq: Four times a day (QID) | INTRAMUSCULAR | Status: DC | PRN

## 2012-06-07 MED ORDER — DEXTROSE 50 % IV SOLN: 25.0000 mL | INTRAVENOUS | Status: DC | PRN

## 2012-06-07 MED ORDER — ACETAMINOPHEN 325 MG PO TABS: 650.0000 mg | ORAL_TABLET | Freq: Four times a day (QID) | ORAL | Status: DC | PRN

## 2012-06-07 MED ORDER — PNEUMOCOCCAL VAC POLYVALENT 25 MCG/0.5ML IJ INJ
0.5000 mL | INJECTION | INTRAMUSCULAR | Status: AC
  Administered 2012-06-08: 0.5 mL via INTRAMUSCULAR
  Filled 2012-06-07: qty 0.5

## 2012-06-07 MED ORDER — INSULIN REGULAR HUMAN 100 UNIT/ML IJ SOLN
INTRAMUSCULAR | Status: AC
  Filled 2012-06-07: qty 1

## 2012-06-07 MED ORDER — KETOROLAC TROMETHAMINE 30 MG/ML IJ SOLN
INTRAMUSCULAR | Status: AC
  Filled 2012-06-07: qty 1

## 2012-06-07 MED ORDER — SODIUM CHLORIDE 0.9 % IV SOLN: INTRAVENOUS | Status: DC

## 2012-06-07 MED ORDER — INSULIN REGULAR BOLUS VIA INFUSION
0.0000 [IU] | Freq: Three times a day (TID) | INTRAVENOUS | Status: DC
  Filled 2012-06-07: qty 10

## 2012-06-07 MED ORDER — ONDANSETRON HCL 4 MG/2ML IJ SOLN
INTRAMUSCULAR | Status: AC
  Filled 2012-06-07: qty 2

## 2012-06-07 MED ORDER — HYDROMORPHONE HCL PF 1 MG/ML IJ SOLN
INTRAMUSCULAR | Status: AC
  Administered 2012-06-07: 1 mg via INTRAVENOUS
  Filled 2012-06-07: qty 1

## 2012-06-07 MED ORDER — POTASSIUM CHLORIDE 10 MEQ/100ML IV SOLN
10.0000 meq | INTRAVENOUS | Status: AC
  Administered 2012-06-07 (×2): 10 meq via INTRAVENOUS
  Filled 2012-06-07 (×2): qty 100

## 2012-06-07 MED ORDER — SODIUM CHLORIDE 0.9 % IV SOLN
INTRAVENOUS | Status: DC
  Filled 2012-06-07 (×2): qty 1

## 2012-06-07 MED ORDER — INFLUENZA VIRUS VACC SPLIT PF IM SUSP
0.5000 mL | INTRAMUSCULAR | Status: AC
  Administered 2012-06-08: 0.5 mL via INTRAMUSCULAR
  Filled 2012-06-07: qty 0.5

## 2012-06-07 MED ORDER — ONDANSETRON HCL 4 MG/2ML IJ SOLN
4.0000 mg | Freq: Once | INTRAMUSCULAR | Status: AC
  Administered 2012-06-07: 4 mg via INTRAVENOUS

## 2012-06-07 MED ORDER — HYDROMORPHONE HCL PF 1 MG/ML IJ SOLN
1.0000 mg | Freq: Once | INTRAMUSCULAR | Status: AC
  Administered 2012-06-07: 1 mg via INTRAVENOUS

## 2012-06-07 MED ORDER — SODIUM CHLORIDE 0.9 % IV BOLUS (SEPSIS)
1000.0000 mL | Freq: Once | INTRAVENOUS | Status: AC
  Administered 2012-06-07: 1000 mL via INTRAVENOUS

## 2012-06-07 MED ORDER — KETOROLAC TROMETHAMINE 30 MG/ML IJ SOLN
30.0000 mg | Freq: Once | INTRAMUSCULAR | Status: AC
  Administered 2012-06-07: 30 mg via INTRAVENOUS

## 2012-06-07 MED ORDER — HYDROMORPHONE HCL PF 1 MG/ML IJ SOLN
INTRAMUSCULAR | Status: AC
  Filled 2012-06-07: qty 1

## 2012-06-07 MED ORDER — ENOXAPARIN SODIUM 40 MG/0.4ML ~~LOC~~ SOLN
40.0000 mg | SUBCUTANEOUS | Status: DC
  Administered 2012-06-07 – 2012-06-08 (×2): 40 mg via SUBCUTANEOUS
  Filled 2012-06-07 (×3): qty 0.4

## 2012-06-07 MED ORDER — DEXTROSE-NACL 5-0.45 % IV SOLN: INTRAVENOUS | Status: DC

## 2012-06-07 MED ORDER — DEXTROSE-NACL 5-0.45 % IV SOLN
INTRAVENOUS | Status: DC
  Administered 2012-06-07: 09:00:00 via INTRAVENOUS
  Administered 2012-06-08: 100 mL/h via INTRAVENOUS

## 2012-06-07 MED ORDER — SODIUM CHLORIDE 0.9 % IV SOLN
INTRAVENOUS | Status: DC
  Administered 2012-06-07: 2 [IU]/h via INTRAVENOUS

## 2012-06-07 MED ORDER — PANTOPRAZOLE SODIUM 40 MG PO TBEC
40.0000 mg | DELAYED_RELEASE_TABLET | Freq: Every day | ORAL | Status: DC
  Administered 2012-06-07 – 2012-06-08 (×2): 40 mg via ORAL
  Filled 2012-06-07 (×2): qty 1

## 2012-06-07 MED ORDER — SODIUM CHLORIDE 0.9 % IV SOLN
INTRAVENOUS | Status: DC
  Administered 2012-06-07: 100 mL/h via INTRAVENOUS

## 2012-06-07 NOTE — ED Notes (Signed)
Pt returned from CT °

## 2012-06-07 NOTE — ED Notes (Signed)
Took patient's blood sugar, result was: 292. Nurse and MD notified.

## 2012-06-07 NOTE — H&P (Signed)
PATIENT DETAILS Name: Beverly Wells Age: 21 y.o. Sex: female Date of Birth: [birth year] Admit Date: 06/06/2012 PCP:No primary provider on file.   CHIEF COMPLAINT:  Persistent vomiting  HPI: Patient is a 21 year old African American female with a long-standing history of presumed insulin-dependent diabetes, prior episodes of diabetic ketoacidosis who presented to med center [facility] today for evaluation of the above-noted complaints. The patient around 2 days ago she started developing abdominal cramps which she attributes to her menstrual period. She claims that when she usually gets these abdominal cramps, it is associated with some nausea and some vomiting.However this episode, her vomiting was much more severe and much more persistent. She claims she had more than 10 episodes of nonbloody nonbilious vomiting yesterday. Upon presentation to the mid center [facility] she was found to have diabetic ketoacidosis, the hospitalist service was then asked to admit this patient for further evaluation and treatment. Patient denies any fever or headache. Patient denies any chest pain or shortness of breath. Patient denies any dysuria or hematuria. She denies any diarrhea.   ALLERGIES:   Allergies  Allergen Reactions  . Banana Hives    Peas and cauliflower  . Orange Concentrate Information systems manager) Hives  . Penicillins Swelling    hives    PAST MEDICAL HISTORY: Past Medical History  Diagnosis Date  . 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 ALLERGIC RHINITIS 05/07/2010  . DKA (diabetic ketoacidoses)     PAST SURGICAL HISTORY: Past Surgical History  Procedure Date  . Tonsillectomy     MEDICATIONS AT HOME: Prior to Admission medications   Medication Sig Start Date End Date Taking? Authorizing Provider  cetirizine (ZYRTEC) 10 MG tablet Take 10 mg by mouth daily.     Yes Historical Provider, MD  insulin NPH-insulin regular (HUMULIN 70/30 PEN)  (70-30) 100 UNIT/ML injection Inject 30 Units into the skin 2 (two) times daily with a meal. Take 30 units with breakfast and 30 units with supper.  You must eat lunch everyday to avoid a low BS at lunchtime.  Check your BS 4 times per day. 01/21/12 01/20/13 Yes Clanford Cyndie Mull, MD    FAMILY HISTORY: Family History  Problem Relation Age of Onset  . Diabetes Neg Hx     SOCIAL HISTORY:  reports that she has never smoked. She has never used smokeless tobacco. She reports that she does not drink alcohol or use illicit drugs.  REVIEW OF SYSTEMS:  Constitutional:   No  weight loss, night sweats,  Fevers, chills, fatigue.  HEENT:    No headaches, Difficulty swallowing,Tooth/dental problems,Sore throat,  No sneezing, itching, ear ache, nasal congestion, post nasal drip,   Cardio-vascular: No chest pain,  Orthopnea, PND, swelling in lower extremities, anasarca,  dizziness, palpitations  GI:  No heartburn, indigestion, abdominal pain, nausea, vomiting, diarrhea, change in bowel habits, loss of appetite  Resp: No shortness of breath with exertion or at rest.  No excess mucus, no productive cough, No non-productive cough,  No coughing up of blood.No change in color of mucus.No wheezing.No chest wall deformity  Skin:  no rash or lesions.  GU:  no dysuria, change in color of urine, no urgency or frequency.  No flank pain.  Musculoskeletal: No joint pain or swelling.  No decreased range of motion.  No back pain.  Psych: No change in mood or affect. No depression or anxiety.  No memory loss.   PHYSICAL EXAM: Blood pressure 120/50,  pulse 88, temperature 98.8 F (37.1 C), temperature source Oral, resp. rate 20, height 5\' 7"  (1.702 m), weight 71.4 kg (157 lb 6.5 oz), last menstrual period 06/06/2012, SpO2 100.00%.  General appearance :Awake, alert, not in any distress. Speech Clear. Not toxic Looking HEENT: Atraumatic and Normocephalic, pupils equally reactive to light and  accomodation Neck: supple, no JVD. No cervical lymphadenopathy.  Chest:Good air entry bilaterally, no added sounds  CVS: S1 S2 regular, no murmurs.  Abdomen: Bowel sounds present, Non tender and not distended with no gaurding, rigidity or rebound. Extremities: B/L Lower Ext shows no edema, both legs are warm to touch, with  dorsalis pedis pulses palpable. Neurology: Awake alert, and oriented X 3, CN II-XII intact, Non focal, Deep Tendon Reflex-2+ all over, plantar's downgoing B/L, sensory exam is grossly intact.  Skin:No Rash Wounds:N/A  LABS ON ADMISSION:   Basename 06/07/12 1227 06/07/12 1015  NA 136 138  K 4.0 4.0  CL 105 105  CO2 18* 17*  GLUCOSE 96 163*  BUN 13 12  CREATININE 0.58 0.61  CALCIUM 8.4 8.6  MG -- --  PHOS -- --   No results found for this basename: AST:2,ALT:2,ALKPHOS:2,BILITOT:2,PROT:2,ALBUMIN:2 in the last 72 hours  Basename 06/07/12 0405  LIPASE 7*  AMYLASE --    Basename 06/07/12 0900 06/06/12 2345  WBC 9.3 7.7  NEUTROABS -- 6.4  HGB 12.7 12.5  HCT 39.4 37.4  MCV 74.6* 71.1*  PLT 248 281   No results found for this basename: CKTOTAL:3,CKMB:3,CKMBINDEX:3,TROPONINI:3 in the last 72 hours No results found for this basename: DDIMER:2 in the last 72 hours No components found with this basename: POCBNP:3   RADIOLOGIC STUDIES ON ADMISSION: Ct Abdomen Pelvis Wo Contrast  06/07/2012  *RADIOLOGY REPORT*  Clinical Data: Mid abdominal pain  CT ABDOMEN AND PELVIS WITHOUT CONTRAST  Technique:  Multidetector CT imaging of the abdomen and pelvis was performed following the standard protocol without intravenous contrast.  Comparison: None.  Findings: 5 mm right middle lobe nodule. The lung bases are otherwise clear.  Heart size within normal limits.  No pleural or pericardial effusion.  Organ abnormality/lesion detection is limited in the absence of intravenous contrast. Examination also challenging due to the relative paucity of intraperitoneal fat.  Within these  limitations, unremarkable liver, biliary system, spleen, pancreas, adrenal glands.  The kidneys are symmetric in size.  No hydronephrosis or hydroureter.  Unable to follow the ureteral course in their entirety as they are decompressed.  There are multiple pelvic calcifications.  No bowel obstruction.  Mild colonic diverticulosis.  Appendix is upper normal in diameter however contains locules of gas.  High attenuation within the appendix suggests retained contrast from a prior study though there is no prior CT in our system. There is a small amount of free fluid within the pelvis and mild mesenteric stranding / edema.  No free intraperitoneal air.  No lymphadenopathy.  Normal caliber vasculature.  Thin-walled bladder.  Nonspecific appearance to the uterus.  No adnexal mass.  No acute osseous finding.  IMPRESSION: No hydronephrosis or hydroureter.  Due to the decompressed state of the ureters, unable to follow their course.  There are numerous pelvic calcifications and a distal ureteral stone cannot be excluded in the appropriate clinical setting.  Mild colonic diverticulosis.  Lack of colonic distension limits further evaluation.  The appendix measures up to 7 mm.  There is no periappendiceal inflammation to suggest acute inflammation.  5 mm nodule right middle lobe. If the patient is at high  risk for bronchogenic carcinoma, follow-up chest CT at 6-12 months is recommended.  If the patient is at low risk for bronchogenic carcinoma, follow-up chest CT at 12 months is recommended.  This recommendation follows the consensus statement: Guidelines for Management of Small Pulmonary Nodules Detected on CT Scans: A Statement from the Fleischner Society as published in Radiology 2005; 237:395-400.   Original Report Authenticated By: Waneta Martins, M.D.     ASSESSMENT AND PLAN: Present on Admission:  .Diabetic keto-acidosis -Placed on insulin infusion per glucose stabilized protocol, we'll transition back to her  usual regimen once anion gap closes.  -Chek BMET for protocol -IV fluids per protocol  .Intractable vomiting -Likely precipitated by DKA, start clear liquids and advance as tolerated.  -c/w supportive care  .Lung nodule seen on imaging study -Outpatient followup  Further plan will depend as patient's clinical course evolves and further radiologic and laboratory data become available. Patient will be monitored closely.  DVT Prophylaxis: -prophylactic Lovenox  Code Status: Full Code  Total time spent for admission equals 45 minutes.  [facility] Pager [phone]  If 7PM-7AM, please contact night-coverage [email or web address] Password [email or web address] 06/07/2012, 1:58 PM

## 2012-06-07 NOTE — ED Notes (Signed)
Took patient's blood sugar, result was 257. Nurse was notified.

## 2012-06-07 NOTE — Care Management Note (Signed)
    Page 1 of 1   06/08/2012     4:57:42 PM   CARE MANAGEMENT NOTE 06/08/2012  Patient:  Beverly Wells, Beverly Wells   Account Number:  [id number]  Date Initiated:  06/07/2012  Documentation initiated by:  Letha Cape  Subjective/Objective Assessment:   dx dka  admit as observation- lives with aunt.     Action/Plan:   Anticipated DC Date:  06/08/2012   Anticipated DC Plan:  HOME/SELF CARE      DC Planning Services  CM consult      Choice offered to / List presented to:             Status of service:  Completed, signed off Medicare Important Message given?   (If response is "NO", the following Medicare IM given date fields will be blank) Date Medicare IM given:   Date Additional Medicare IM given:    Discharge Disposition:  HOME/SELF CARE  Per UR Regulation:  Reviewed for med. necessity/level of care/duration of stay  If discussed at Long Length of Stay Meetings, dates discussed:    Comments:  06/08/12 16:57 Letha Cape RN, BSN [phone] patient for dc today.  06/07/12 15:03 Letha Cape RN, BSN [phone] patient lives with aunt, patient has medication coverage and transportation.  PTA independent.  NCM will continue to follow for dc needs.

## 2012-06-07 NOTE — ED Provider Notes (Signed)
History     CSN: [id number]  Arrival date & time 06/06/12  2333   First MD Initiated Contact with Patient 06/07/12 0107      Chief Complaint  Patient presents with  . Abdominal Pain  . Vomiting    (Consider location/radiation/quality/duration/timing/severity/associated sxs/prior treatment) Patient is a 21 y.o. female presenting with abdominal pain. The history is provided by the patient.  Abdominal Pain The primary symptoms of the illness include abdominal pain, nausea and vomiting. The primary symptoms of the illness do not include fever, shortness of breath, diarrhea, dysuria or vaginal discharge. The current episode started yesterday. The onset of the illness was sudden. The problem has not changed since onset. The abdominal pain began yesterday. The pain came on suddenly. The abdominal pain has been unchanged since its onset. The abdominal pain is generalized. The abdominal pain does not radiate. The abdominal pain is relieved by nothing. The abdominal pain is exacerbated by vomiting.  Vomiting occurs 6 to 10 times per day. The emesis contains stomach contents.  The illness is associated with awakening from sleep. The patient states that she believes she is currently not pregnant. Symptoms associated with the illness do not include diaphoresis, constipation, urgency or back pain. Significant associated medical issues include diabetes.  Did not take her meds yesterday  Past Medical History  Diagnosis Date  . 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 ALLERGIC RHINITIS 05/07/2010  . DKA (diabetic ketoacidoses)     History reviewed. No pertinent past surgical history.  Family History  Problem Relation Age of Onset  . Diabetes Neg Hx     History  Substance Use Topics  . Smoking status: Never Smoker   . Smokeless tobacco: Not on file  . Alcohol Use: No    OB History    Grav Para Term Preterm Abortions TAB SAB Ect Mult Living          Review of Systems  Constitutional: Negative for fever and diaphoresis.  Respiratory: Negative for cough and shortness of breath.   Cardiovascular: Negative for chest pain.  Gastrointestinal: Positive for nausea, vomiting and abdominal pain. Negative for diarrhea and constipation.  Genitourinary: Negative for dysuria, urgency and vaginal discharge.  Musculoskeletal: Negative for back pain.  All other systems reviewed and are negative.    Allergies  Banana; Orange concentrate; and Penicillins  Home Medications   Current Outpatient Rx  Name Route Sig Dispense Refill  . CETIRIZINE HCL 10 MG PO TABS Oral Take 10 mg by mouth daily.      . INSULIN ISOPHANE & REGULAR (70-30) 100 UNIT/ML [place] SUSP Subcutaneous Inject 30 Units into the skin 2 (two) times daily with a meal. Take 30 units with breakfast and 30 units with supper.  You must eat lunch everyday to avoid a low BS at lunchtime.  Check your BS 4 times per day. 15 mL 3    BP 113/64  Pulse 90  Temp 99.2 F (37.3 C) (Oral)  Resp 18  Ht 5\' 5"  (1.651 m)  Wt 150 lb (68.04 kg)  BMI 24.96 kg/m2  SpO2 100%  LMP 06/06/2012  Physical Exam  Constitutional: She is oriented to person, place, and time. She appears well-developed and well-nourished.  HENT:  Head: Normocephalic and atraumatic.       Tacky mucus membranes  Eyes: Conjunctivae normal are normal. Pupils are equal, round, and reactive to light.  Neck: Normal range of motion. Neck supple.  Cardiovascular: Normal  rate, regular rhythm and intact distal pulses.   Pulmonary/Chest: Effort normal and breath sounds normal. She has no wheezes. She has no rales.  Abdominal: Soft. Bowel sounds are normal. She exhibits no distension and no mass. There is no rebound and no guarding.       Mild diffuse  Musculoskeletal: Normal range of motion.  Neurological: She is alert and oriented to person, place, and time.  Skin: Skin is warm and dry. She is not diaphoretic.  Psychiatric: She  has a normal mood and affect.    ED Course  Procedures (including critical care time)  Labs Reviewed  CBC WITH DIFFERENTIAL - Abnormal; Notable for the following:    RBC 5.26 (*)     MCV 71.1 (*)     MCH 23.8 (*)     Neutrophils Relative 83 (*)     All other components within normal limits  URINALYSIS, ROUTINE W REFLEX MICROSCOPIC - Abnormal; Notable for the following:    Specific Gravity, Urine 1.035 (*)     Glucose, UA >1000 (*)     Hgb urine dipstick LARGE (*)     Ketones, ur >80 (*)     All other components within normal limits  GLUCOSE, CAPILLARY - Abnormal; Notable for the following:    Glucose-Capillary 333 (*)     All other components within normal limits  BASIC METABOLIC PANEL - Abnormal; Notable for the following:    Sodium 134 (*)     CO2 16 (*)     Glucose, Bld 352 (*)     All other components within normal limits  POCT I-STAT 3, BLOOD GAS (G3+) - Abnormal; Notable for the following:    pH, Arterial 7.342 (*)     pCO2 arterial 32.4 (*)     pO2, Arterial 117.0 (*)     Bicarbonate 17.5 (*)     Acid-base deficit 7.0 (*)     All other components within normal limits  URINE MICROSCOPIC-ADD ON - Abnormal; Notable for the following:    Bacteria, UA MANY (*)     All other components within normal limits  GLUCOSE, CAPILLARY - Abnormal; Notable for the following:    Glucose-Capillary 292 (*)     All other components within normal limits  GLUCOSE, CAPILLARY - Abnormal; Notable for the following:    Glucose-Capillary 257 (*)     All other components within normal limits  PREGNANCY, URINE  BASIC METABOLIC PANEL   No results found.   No diagnosis found.    MDM  Gap not improving and glucose stabilizer initiated  550 am Called Dr. Toniann Fail to inform him patient would be arriving on glucose stabilizer with an ETA of 30 minutes and would need to be reevaluated immediately.  Dr. Toniann Fail verbalized understanding       Faige Seely Smitty Cords, MD 06/07/12  [phone]

## 2012-06-07 NOTE — ED Notes (Signed)
IV placed by EMS in the right a/c was halfway out of pt and fluids would not infuse. IV site d/c'ed and attempting to establish new access.

## 2012-06-07 NOTE — Progress Notes (Signed)
Pt was brought from [facility] via [facility]. Pt was put in bed and oriented to room. Pt is in no apparent distress. VS are stable.

## 2012-06-07 NOTE — ED Notes (Signed)
Multiple attempts to obtain IV access made by RNx3 and RT without success. Awaiting MD eval.

## 2012-06-07 NOTE — ED Notes (Signed)
Attempted IV insertion in left AC unsuccessful.

## 2012-06-08 DIAGNOSIS — R1115 Cyclical vomiting syndrome unrelated to migraine: Secondary | ICD-10-CM

## 2012-06-08 LAB — CBC
HCT: 31.3 % — ABNORMAL LOW (ref 36.0–46.0)
Hemoglobin: 10.1 g/dL — ABNORMAL LOW (ref 12.0–15.0)
MCHC: 32.3 g/dL (ref 30.0–36.0)
WBC: 6.2 10*3/uL (ref 4.0–10.5)

## 2012-06-08 LAB — GLUCOSE, CAPILLARY
Glucose-Capillary: 93 mg/dL (ref 70–99)
Glucose-Capillary: 114 mg/dL — ABNORMAL HIGH (ref 70–99)
Glucose-Capillary: 167 mg/dL — ABNORMAL HIGH (ref 70–99)
Glucose-Capillary: 163 mg/dL — ABNORMAL HIGH (ref 70–99)
Glucose-Capillary: 177 mg/dL — ABNORMAL HIGH (ref 70–99)
Glucose-Capillary: 190 mg/dL — ABNORMAL HIGH (ref 70–99)

## 2012-06-08 LAB — BASIC METABOLIC PANEL
BUN: 12 mg/dL (ref 6–23)
CO2: 20 mEq/L (ref 19–32)
Chloride: 107 mEq/L (ref 96–112)
Glucose, Bld: 135 mg/dL — ABNORMAL HIGH (ref 70–99)
Potassium: 3.6 mEq/L (ref 3.5–5.1)

## 2012-06-08 MED ORDER — INSULIN NPH (HUMAN) (ISOPHANE) 100 UNIT/ML ~~LOC~~ SUSP
20.0000 [IU] | Freq: Once | SUBCUTANEOUS | Status: AC
  Administered 2012-06-08: 20 [IU] via SUBCUTANEOUS
  Filled 2012-06-08: qty 10

## 2012-06-08 MED ORDER — INSULIN NPH ISOPHANE & REGULAR (70-30) 100 UNIT/ML ~~LOC~~ SUSP: 30.0000 [IU] | Freq: Two times a day (BID) | SUBCUTANEOUS | Status: DC

## 2012-06-08 MED ORDER — LORATADINE 10 MG PO TABS
10.0000 mg | ORAL_TABLET | Freq: Every day | ORAL | Status: DC | PRN
  Administered 2012-06-08: 10 mg via ORAL
  Filled 2012-06-08 (×2): qty 1

## 2012-06-08 MED ORDER — INSULIN ASPART PROT & ASPART (70-30 MIX) 100 UNIT/ML ~~LOC~~ SUSP
30.0000 [IU] | Freq: Two times a day (BID) | SUBCUTANEOUS | Status: DC
  Filled 2012-06-08: qty 10

## 2012-06-08 MED ORDER — DIPHENHYDRAMINE HCL 25 MG PO CAPS
25.0000 mg | ORAL_CAPSULE | Freq: Once | ORAL | Status: AC
  Administered 2012-06-08: 25 mg via ORAL
  Filled 2012-06-08: qty 1

## 2012-06-08 MED ORDER — INSULIN ASPART 100 UNIT/ML ~~LOC~~ SOLN
0.0000 [IU] | Freq: Three times a day (TID) | SUBCUTANEOUS | Status: DC
  Administered 2012-06-08: 8 [IU] via SUBCUTANEOUS

## 2012-06-08 NOTE — Discharge Summary (Signed)
PNoneATIENT DETAILS Name: Beverly Wells Age: 21 y.o. Sex: female Date of Birth: Mar 26, 1991 MRN: [id number]. Admit Date: 06/06/2012 Admitting Physician: Eduard Clos, MD PCP:No primary provider on file.  Recommendations for Outpatient Follow-up:  5 mm lung nodule in the right middle lobe-repeat CT Chest in 6-12 months Optimize insulin regimen   PRIMARY DISCHARGE DIAGNOSIS:  Principal Problem:  *Diabetic keto-acidosis Active Problems:  Intractable vomiting  Lung nodule seen on imaging study      PAST MEDICAL HISTORY: Past Medical History  Diagnosis Date  . 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 ALLERGIC RHINITIS 05/07/2010  . DKA (diabetic ketoacidoses)     DISCHARGE MEDICATIONS:   Medication List     As of 06/08/2012  4:38 PM    TAKE these medications         cetirizine 10 MG tablet   Commonly known as: ZYRTEC   Take 10 mg by mouth daily.      insulin NPH-insulin regular (70-30) 100 UNIT/ML injection   Commonly known as: NOVOLIN 70/30   Inject 30 Units into the skin 2 (two) times daily with a meal. Take 30 units with breakfast and 30 units with supper.  You must eat lunch everyday to avoid a low BS at lunchtime.  Check your BS 4 times per day.         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 The patient around 2 days ago she started developing abdominal cramps which she attributes to her menstrual period. She claims that when she usually gets these abdominal cramps, it is associated with some nausea and some vomiting.However this episode, her vomiting was much more severe and much more persistent. She claims she had more than 10  episodes of nonbloody nonbilious vomiting yesterday. Upon presentation to the mid center [facility] she was found to have diabetic ketoacidosis, the hospitalist service was then asked to admit this patient for further evaluation and treatment.  CONSULTATIONS:   None  PERTINENT RADIOLOGIC STUDIES: Ct Abdomen Pelvis Wo Contrast  06/07/2012  *RADIOLOGY REPORT*  Clinical Data: Mid abdominal pain  CT ABDOMEN AND PELVIS WITHOUT CONTRAST  Technique:  Multidetector CT imaging of the abdomen and pelvis was performed following the standard protocol without intravenous contrast.  Comparison: None.  Findings: 5 mm right middle lobe nodule. The lung bases are otherwise clear.  Heart size within normal limits.  No pleural or pericardial effusion.  Organ abnormality/lesion detection is limited in the absence of intravenous contrast. Examination also challenging due to the relative paucity of intraperitoneal fat.  Within these limitations, unremarkable liver, biliary system, spleen, pancreas, adrenal glands.  The kidneys are symmetric in size.  No hydronephrosis or hydroureter.  Unable to follow the ureteral course in their entirety as they are decompressed.  There are multiple pelvic calcifications.  No bowel obstruction.  Mild colonic diverticulosis.  Appendix is upper normal in diameter however contains locules of gas.  High attenuation within the appendix suggests retained contrast from a prior study though there is no prior CT in our system. There is a small amount of free fluid within the pelvis and mild mesenteric stranding / edema.  No free intraperitoneal air.  No lymphadenopathy.  Normal  caliber vasculature.  Thin-walled bladder.  Nonspecific appearance to the uterus.  No adnexal mass.  No acute osseous finding.  IMPRESSION: No hydronephrosis or hydroureter.  Due to the decompressed state of the ureters, unable to follow their course.  There are numerous pelvic calcifications and a distal ureteral stone cannot be  excluded in the appropriate clinical setting.  Mild colonic diverticulosis.  Lack of colonic distension limits further evaluation.  The appendix measures up to 7 mm.  There is no periappendiceal inflammation to suggest acute inflammation.  5 mm nodule right middle lobe. If the patient is at high risk for bronchogenic carcinoma, follow-up chest CT at 6-12 months is recommended.  If the patient is at low risk for bronchogenic carcinoma, follow-up chest CT at 12 months is recommended.  This recommendation follows the consensus statement: Guidelines for Management of Small Pulmonary Nodules Detected on CT Scans: A Statement from the Fleischner Society as published in Radiology 2005; 237:395-400.   Original Report Authenticated By: Waneta Martins, M.D.      PERTINENT LAB RESULTS: CBC:  Basename 06/08/12 0505 06/07/12 0900  WBC 6.2 9.3  HGB 10.1* 12.7  HCT 31.3* 39.4  PLT 221 248   CMET CMP     Component Value Date/Time   NA 137 06/08/2012 0505   K 3.6 06/08/2012 0505   CL 107 06/08/2012 0505   CO2 20 06/08/2012 0505   GLUCOSE 135* 06/08/2012 0505   BUN 12 06/08/2012 0505   CREATININE 0.64 06/08/2012 0505   CALCIUM 8.1* 06/08/2012 0505   PROT 9.5* 01/19/2012 1034   ALBUMIN 4.7 01/19/2012 1034   AST 17 01/19/2012 1034   ALT 7 01/19/2012 1034   ALKPHOS 119* 01/19/2012 1034   BILITOT 0.3 01/19/2012 1034   GFRNONAA >90 06/08/2012 0505   GFRAA >90 06/08/2012 0505    GFR Estimated Creatinine Clearance: 110 ml/min (by C-G formula based on Cr of 0.64).  Basename 06/07/12 0405  LIPASE 7*  AMYLASE --   No results found for this basename: CKTOTAL:3,CKMB:3,CKMBINDEX:3,TROPONINI:3 in the last 72 hours No components found with this basename: POCBNP:3 No results found for this basename: DDIMER:2 in the last 72 hours  Basename 06/08/12 0505  HGBA1C 11.0*   No results found for this basename: CHOL:2,HDL:2,LDLCALC:2,TRIG:2,CHOLHDL:2,LDLDIRECT:2 in the last 72 hours No results found for this  basename: TSH,T4TOTAL,FREET3,T3FREE,THYROIDAB in the last 72 hours No results found for this basename: VITAMINB12:2,FOLATE:2,FERRITIN:2,TIBC:2,IRON:2,RETICCTPCT:2 in the last 72 hours Coags: No results found for this basename: PT:2,INR:2 in the last 72 hours Microbiology: No results found for this or any previous visit (from the past 240 hour(s)).   BRIEF HOSPITAL COURSE:   Principal Problem:  *Diabetic keto-acidosis -patient was admitted with vomiting, due to persistent vomiting patient was not taking any of her insulin. On presentation to the ED-she was found to have DKA. She was then admitted, placed on IV insulin gtt per glucose stabilizer protocol. Once her anion gap close, she was then transitioned to SQ insulin. She will continue with her usual dosing of Insulin 70/30 on discharge. -patient apparently-has not been checking her CBG's at home, ?compliance to outpatient follow up and diet instruction. Her A1C is 13.1. -She has been counseled extensively by me regarding compliance to meds, diet and to follow up, complications of DM-like eye involvement, cardiac, renal issues etc were all discussed.  -She will follow up with the MD's listed below for further optimization of her glycemic control.  Active Problems:  Intractable vomiting -?2/2 DKA -She was briefly placed  on NPO status, given supportive care. A CT Scan on the Abdomen did not show significant abnormalities. -Her diet has been now been advanced, and she has tolerated a regular diet with no difficulty. She is now requesting she be discharged home today   Lung nodule seen on imaging study  -outpatient follow up  TODAY-DAY OF DISCHARGE:  Subjective:   Beverly Wells today has no headache,no chest abdominal pain,no new weakness tingling or numbness, feels much better wants to go home today.   Objective:   Blood pressure 94/53, pulse 78, temperature 97.5 F (36.4 C), temperature source Oral, resp. rate 18, height 5\' 7"   (1.702 m), weight 71.4 kg (157 lb 6.5 oz), last menstrual period 06/06/2012, SpO2 100.00%.  Intake/Output Summary (Last 24 hours) at 06/08/12 1638 Last data filed at 06/08/12 1300  Gross per 24 hour  Intake 2389.81 ml  Output      0 ml  Net 2389.81 ml    Exam Awake Alert, Oriented *3, No new F.N deficits, Normal affect [place].AT,PERRAL Supple Neck,No JVD, No cervical lymphadenopathy appriciated.  Symmetrical Chest wall movement, Good air movement bilaterally, CTAB RRR,No Gallops,Rubs or new Murmurs, No Parasternal Heave +ve B.Sounds, Abd Soft, Non tender, No organomegaly appriciated, No rebound -guarding or rigidity. No Cyanosis, Clubbing or edema, No new Rash or bruise  DISCHARGE CONDITION: Stable  DISPOSITION: HOME  DISCHARGE INSTRUCTIONS:    Activity:  As tolerated   Diet recommendation: Diabetic Diet       Follow-up Information    Follow up with Dorrene German, MD. On 07/10/2012. (3:15 , but come 15 mins early to fill out papar work and bring id and meds and $3, call your case worker to add MD to [facility] card.)    Contact information:   [address] [place] [address] [phone]       Follow up with Romero Belling, MD. Schedule an appointment as soon as possible for a visit in 2 weeks.   Contact information:   [address] [facility] [address] [place] [address] [phone]         Total Time spent on discharge equals 45 minutes.  SignedJeoffrey Massed 06/08/2012 4:38 PM

## 2012-06-08 NOTE — Progress Notes (Signed)
PATIENT DETAILS Name: Beverly Wells Age: 21 y.o. Sex: female Date of Birth: 1991-05-02 Admit Date: 06/06/2012 Admitting Physician Eduard Clos, MD PCP:No primary provider on file.  Subjective: No further nausea or vomiting. Feels better  Assessment/Plan: Principal Problem:  *Diabetic keto-acidosis -resolved, off Insulin gtt after 1 dose of NPH -will resume usual 70/30 dosing today -A1C 13.1-?compliance to meds/follow up (Has not see Endocrionologist-Dr Everardo All for 6 months) -monitor for the rest of the day-potential d/c later this evening  Active Problems:  Intractable vomiting -resolved -advance diet   Lung nodule seen on imaging study -outpatient follow up  Disposition: Remain inpatient-possible d/c later today  DVT Prophylaxis: Prophylactic Lovenox  Code Status: Full code   Procedures:  None  CONSULTS:  None  PHYSICAL EXAM: Vital signs in last 24 hours: Filed Vitals:   06/07/12 1347 06/07/12 2053 06/08/12 0543 06/08/12 0845  BP: 120/50 99/58 98/56  93/53  Pulse: 88 89 87 75  Temp: 98.8 F (37.1 C) 98.1 F (36.7 C) 98.1 F (36.7 C) 98.4 F (36.9 C)  TempSrc: Oral Oral Oral Oral  Resp: 20 20 20 20   Height:      Weight:      SpO2: 100% 100% 98% 100%    Weight change:  Body mass index is 24.65 kg/(m^2).   Gen Exam: Awake and alert with clear speech.   Neck: Supple, No JVD.   Chest: B/L Clear.   CVS: S1 S2 Regular, no murmurs.  Abdomen: soft, BS +, non tender, non distended.  Extremities: no edema, lower extremities warm to touch. Neurologic: Non Focal.  Skin: No Rash.   Wounds: N/A.    Intake/Output from previous day:  Intake/Output Summary (Last 24 hours) at 06/08/12 1055 Last data filed at 06/08/12 0706  Gross per 24 hour  Intake 2149.81 ml  Output      0 ml  Net 2149.81 ml     LAB RESULTS: CBC  Lab 06/08/12 0505 06/07/12 0900 06/06/12 2345  WBC 6.2 9.3 7.7  HGB 10.1* 12.7 12.5  HCT 31.3* 39.4 37.4  PLT 221 248  281  MCV 74.0* 74.6* 71.1*  MCH 23.9* 24.1* 23.8*  MCHC 32.3 32.2 33.4  RDW 14.5 14.6 14.4  LYMPHSABS -- -- 0.9  MONOABS -- -- 0.3  EOSABS -- -- 0.1  BASOSABS -- -- 0.0  BANDABS -- -- --    Chemistries   Lab 06/08/12 0505 06/07/12 1826 06/07/12 1707 06/07/12 1415 06/07/12 1227  NA 137 136 133* 134* 136  K 3.6 3.8 4.5 4.1 4.0  CL 107 104 102 104 105  CO2 20 16* 18* 16* 18*  GLUCOSE 135* 272* 293* 124* 96  BUN 12 11 11 12 13   CREATININE 0.64 0.60 0.62 0.55 0.58  CALCIUM 8.1* 8.9 8.3* 8.3* 8.4  MG -- -- -- -- --    CBG:  Lab 06/08/12 1011 06/08/12 0901 06/08/12 0806 06/08/12 0705 06/08/12 0552  GLUCAP 211* 190* 177* 163* 167*    GFR Estimated Creatinine Clearance: 110 ml/min (by C-G formula based on Cr of 0.64).  Coagulation profile No results found for this basename: INR:5,PROTIME:5 in the last 168 hours  Cardiac Enzymes No results found for this basename: CK:3,CKMB:3,TROPONINI:3,MYOGLOBIN:3 in the last 168 hours  No components found with this basename: POCBNP:3 No results found for this basename: DDIMER:2 in the last 72 hours No results found for this basename: HGBA1C:2 in the last 72 hours No results found for this basename: CHOL:2,HDL:2,LDLCALC:2,TRIG:2,CHOLHDL:2,LDLDIRECT:2 in the last 72 hours  No results found for this basename: TSH,T4TOTAL,FREET3,T3FREE,THYROIDAB in the last 72 hours No results found for this basename: VITAMINB12:2,FOLATE:2,FERRITIN:2,TIBC:2,IRON:2,RETICCTPCT:2 in the last 72 hours  Basename 06/07/12 0405  LIPASE 7*  AMYLASE --    Urine Studies No results found for this basename: UACOL:2,UAPR:2,USPG:2,UPH:2,UTP:2,UGL:2,UKET:2,UBIL:2,UHGB:2,UNIT:2,UROB:2,ULEU:2,UEPI:2,UWBC:2,URBC:2,UBAC:2,CAST:2,CRYS:2,UCOM:2,BILUA:2 in the last 72 hours  MICROBIOLOGY: No results found for this or any previous visit (from the past 240 hour(s)).  RADIOLOGY STUDIES/RESULTS: Ct Abdomen Pelvis Wo Contrast  06/07/2012  *RADIOLOGY REPORT*  Clinical Data: Mid  abdominal pain  CT ABDOMEN AND PELVIS WITHOUT CONTRAST  Technique:  Multidetector CT imaging of the abdomen and pelvis was performed following the standard protocol without intravenous contrast.  Comparison: None.  Findings: 5 mm right middle lobe nodule. The lung bases are otherwise clear.  Heart size within normal limits.  No pleural or pericardial effusion.  Organ abnormality/lesion detection is limited in the absence of intravenous contrast. Examination also challenging due to the relative paucity of intraperitoneal fat.  Within these limitations, unremarkable liver, biliary system, spleen, pancreas, adrenal glands.  The kidneys are symmetric in size.  No hydronephrosis or hydroureter.  Unable to follow the ureteral course in their entirety as they are decompressed.  There are multiple pelvic calcifications.  No bowel obstruction.  Mild colonic diverticulosis.  Appendix is upper normal in diameter however contains locules of gas.  High attenuation within the appendix suggests retained contrast from a prior study though there is no prior CT in our system. There is a small amount of free fluid within the pelvis and mild mesenteric stranding / edema.  No free intraperitoneal air.  No lymphadenopathy.  Normal caliber vasculature.  Thin-walled bladder.  Nonspecific appearance to the uterus.  No adnexal mass.  No acute osseous finding.  IMPRESSION: No hydronephrosis or hydroureter.  Due to the decompressed state of the ureters, unable to follow their course.  There are numerous pelvic calcifications and a distal ureteral stone cannot be excluded in the appropriate clinical setting.  Mild colonic diverticulosis.  Lack of colonic distension limits further evaluation.  The appendix measures up to 7 mm.  There is no periappendiceal inflammation to suggest acute inflammation.  5 mm nodule right middle lobe. If the patient is at high risk for bronchogenic carcinoma, follow-up chest CT at 6-12 months is recommended.  If the  patient is at low risk for bronchogenic carcinoma, follow-up chest CT at 12 months is recommended.  This recommendation follows the consensus statement: Guidelines for Management of Small Pulmonary Nodules Detected on CT Scans: A Statement from the Fleischner Society as published in Radiology 2005; 237:395-400.   Original Report Authenticated By: Waneta Martins, M.D.     MEDICATIONS: Scheduled Meds:   . diphenhydrAMINE  25 mg Oral Once  . enoxaparin  40 mg Subcutaneous Q24H  . influenza  inactive virus vaccine  0.5 mL Intramuscular Tomorrow-1000  . insulin aspart  0-15 Units Subcutaneous TID WC  . insulin NPH  20 Units Subcutaneous Once  . insulin regular      . pantoprazole  40 mg Oral Q1200  . pneumococcal 23 valent vaccine  0.5 mL Intramuscular Tomorrow-1000  . potassium chloride  10 mEq Intravenous Q1H   Continuous Infusions:   . DISCONTD: sodium chloride Stopped (06/07/12 0815)  . DISCONTD: dextrose 5 % and 0.45% NaCl 100 mL/hr (06/08/12 0556)  . DISCONTD: insulin (NOVOLIN-R) infusion 1 mL/hr at 06/08/12 0706   PRN Meds:.acetaminophen, dextrose, loratadine, ondansetron (ZOFRAN) IV  Antibiotics: Anti-infectives    None  Jeoffrey Massed, MD  [facility] Pager:[phone]  If 7PM-7AM, please contact night-coverage [email or web address] Password [email or web address] 06/08/2012, 10:55 AM   LOS: 2 days

## 2012-06-08 NOTE — Progress Notes (Signed)
Patient discharge instruction reviewed, pt verbalized understanding.  Patient IV removed No concerns verbalized. Skin intact 

## 2012-10-14 ENCOUNTER — Emergency Department (INDEPENDENT_AMBULATORY_CARE_PROVIDER_SITE_OTHER)
Admission: EM | Admit: 2012-10-14 | Discharge: 2012-10-14 | Disposition: A | Discharge status: Home / Self Care | Payer: Medicaid Other | Source: Home / Self Care | Attending: Emergency Medicine | Admitting: Emergency Medicine

## 2012-10-14 ENCOUNTER — Emergency Department (HOSPITAL_COMMUNITY)
Admission: EM | Admit: 2012-10-14 | Discharge: 2012-10-14 | Disposition: A | Discharge status: Home / Self Care | Payer: Medicaid Other | Attending: Emergency Medicine | Admitting: Emergency Medicine

## 2012-10-14 ENCOUNTER — Encounter (HOSPITAL_COMMUNITY): Payer: Self-pay | Admitting: *Deleted

## 2012-10-14 ENCOUNTER — Encounter (HOSPITAL_COMMUNITY): Payer: Self-pay | Admitting: Emergency Medicine

## 2012-10-14 DIAGNOSIS — E119 Type 2 diabetes mellitus without complications: Secondary | ICD-10-CM | POA: Insufficient documentation

## 2012-10-14 DIAGNOSIS — J069 Acute upper respiratory infection, unspecified: Secondary | ICD-10-CM

## 2012-10-14 DIAGNOSIS — L309 Dermatitis, unspecified: Secondary | ICD-10-CM

## 2012-10-14 DIAGNOSIS — R21 Rash and other nonspecific skin eruption: Secondary | ICD-10-CM

## 2012-10-14 DIAGNOSIS — Z794 Long term (current) use of insulin: Secondary | ICD-10-CM | POA: Insufficient documentation

## 2012-10-14 HISTORY — DX: Type 2 diabetes mellitus without complications: E11.9

## 2012-10-14 MED ORDER — FEXOFENADINE-PSEUDOEPHED ER 60-120 MG PO TB12: 1.0000 | ORAL_TABLET | Freq: Two times a day (BID) | ORAL | Status: DC

## 2012-10-14 MED ORDER — HYDROCORTISONE 1 % EX CREA: TOPICAL_CREAM | CUTANEOUS | Status: DC

## 2012-10-14 MED ORDER — CLINDAMYCIN HCL 300 MG PO CAPS: 300.0000 mg | ORAL_CAPSULE | Freq: Four times a day (QID) | ORAL | Status: DC

## 2012-10-14 MED ORDER — CETIRIZINE HCL 10 MG PO CAPS: 10.0000 mg | ORAL_CAPSULE | Freq: Every day | ORAL | Status: DC

## 2012-10-14 MED ORDER — DIPHENHYDRAMINE HCL 25 MG PO TABS: 25.0000 mg | ORAL_TABLET | Freq: Four times a day (QID) | ORAL | Status: DC

## 2012-10-14 MED ORDER — OXYMETAZOLINE HCL 0.05 % NA SOLN: 2.0000 | Freq: Two times a day (BID) | NASAL | Status: DC

## 2012-10-14 NOTE — ED Provider Notes (Signed)
History     CSN: [id number]  Arrival date & time 10/14/12  0950   First MD Initiated Contact with Patient 10/14/12 0957      Chief Complaint  Patient presents with  . URI    (Consider location/radiation/quality/duration/timing/severity/associated sxs/prior treatment) HPI Comments: Patient presents with complaint of rash and upper respiratory infection symptoms for the past 2 days. Patient describes rash as small bumps over her face, neck, and arms. This began first. This was followed by sinus pressure and nasal congestion, runny nose. She's not taken any medications for this prior to arrival. She has not had any mouth or neck swelling, shortness of breath, nausea or vomiting. She denies new medications or skin exposures. She has not been around anyone with similar symptoms. Onset of symptoms gradual. Course is constant. Nothing makes symptoms better or worse. Patient does have a history of diabetes and states that her sugars have been normal.  The history is provided by the patient.    Past Medical History  Diagnosis Date  . Diabetes mellitus without complication     History reviewed. No pertinent past surgical history.  History reviewed. No pertinent family history.  History  Substance Use Topics  . Smoking status: Not on file  . Smokeless tobacco: Not on file  . Alcohol Use: No    OB History   Grav Para Term Preterm Abortions TAB SAB Ect Mult Living                  Review of Systems  Constitutional: Negative for fever, chills and fatigue.  HENT: Positive for congestion, rhinorrhea and sinus pressure. Negative for ear pain, sore throat and neck stiffness.   Eyes: Negative for redness.  Respiratory: Negative for cough and wheezing.   Gastrointestinal: Negative for nausea, vomiting, abdominal pain and diarrhea.  Genitourinary: Negative for dysuria.  Musculoskeletal: Negative for myalgias.  Skin: Positive for rash.  Neurological: Negative for headaches.   Hematological: Negative for adenopathy.    Allergies  Penicillins  Home Medications   Current Outpatient Rx  Name  Route  Sig  Dispense  Refill  . cetirizine (ZYRTEC) 10 MG tablet   Oral   Take 10 mg by mouth daily.         . insulin NPH-insulin regular (NOVOLIN 70/30) (70-30) 100 UNIT/ML injection   Subcutaneous   Inject 30 Units into the skin 2 (two) times daily with a meal.         . Cetirizine HCl 10 MG CAPS   Oral   Take 1 capsule (10 mg total) by mouth daily.   14 capsule   0   . diphenhydrAMINE (BENADRYL) 25 MG tablet   Oral   Take 1 tablet (25 mg total) by mouth every 6 (six) hours.   20 tablet   0   . oxymetazoline (AFRIN NASAL SPRAY) 0.05 % nasal spray   Nasal   Place 2 sprays into the nose 2 (two) times daily.   30 mL   0     BP 126/69  Pulse 79  Temp(Src) 97.7 F (36.5 C) (Oral)  Resp 16  SpO2 100%  Physical Exam  Nursing note and vitals reviewed. Constitutional: She appears well-developed and well-nourished.  HENT:  Head: Normocephalic and atraumatic.  Nose: Mucosal edema and rhinorrhea present. Right sinus exhibits maxillary sinus tenderness and frontal sinus tenderness. Left sinus exhibits maxillary sinus tenderness and frontal sinus tenderness.  Mouth/Throat: Uvula is midline, oropharynx is clear and moist and mucous membranes  are normal. Mucous membranes are not dry.  Eyes: Conjunctivae are normal. Pupils are equal, round, and reactive to light. Right eye exhibits no discharge. Left eye exhibits no discharge.  Neck: Normal range of motion. Neck supple.  Cardiovascular: Normal rate, regular rhythm and normal heart sounds.   Pulmonary/Chest: Effort normal and breath sounds normal.  Abdominal: Soft. There is no tenderness.  Neurological: She is alert.  Skin: Skin is warm and dry.  Multiple small papules over her face, shoulders, neck, and upper arms. Patient denies herald patch. Papules are slightly ovoid and salmon colored.   Psychiatric: She has a normal mood and affect.    ED Course  Procedures (including critical care time)  Labs Reviewed - No data to display No results found.   1. Upper respiratory infection   2. Rash and nonspecific skin eruption     10:46 AM Patient seen and examined.   Vital signs reviewed and are as follows: Filed Vitals:   10/14/12 0958  BP: 126/69  Pulse: 79  Temp: 97.7 F (36.5 C)  Resp: 16   10:53 AM Patient counseled on supportive care for viral URI and s/s to return including worsening symptoms, persistent fever, persistent vomiting, or if they have any other concerns.  Urged to see PCP if symptoms persist for more than 3 days. Patient verbalizes understanding and agrees with plan.      MDM  Patient with upper respiratory tract infection symptoms. No fever. Patient appears well, nontoxic. No airway involvement of rash. No angioedema or anaphylaxis suspected. Rash is similar in appearance to pityraisis rosea however patient denies herald patch and is in an abnormal distribution for this. Likely related to viral infection. No concern for meningitis.       Renne Crigler, [place] 10/14/12 1054

## 2012-10-14 NOTE — ED Notes (Signed)
Pt reports having cold and URI symptoms x 2 days, having congestion, watery eyes. No distress noted.

## 2012-10-14 NOTE — ED Provider Notes (Signed)
Medical screening examination/treatment/procedure(s) were performed by non-physician practitioner and as supervising physician I was immediately available for consultation/collaboration.    Vida Roller, MD 10/14/12 1556

## 2012-10-14 NOTE — ED Notes (Signed)
Pt c/o sinus pressure and pain. Cough dry/nonproductive. Pt states"head feels swimmy" Pt has not tried any otc meds.  Symptoms present x 2 days.

## 2012-10-14 NOTE — ED Provider Notes (Signed)
Chief Complaint  Patient presents with  . Facial Pain    sinus pressure and pain. drainage. feverish    History of Present Illness:   Beverly Wells  is a 22 year old female with type 1 diabetes who presents with a two-day history of nasal congestion, yellow drainage, sinus pressure, headache, dry cough, postnasal drip, sore throat, she's felt hot, and had pruritic red spots on her chest, neck, and face. She denies any fever, wheezing, ear pain, chest pain, or GI symptoms. She denies being exposed to any contactants such as change in soaps, body wash, laundry detergent, washing powders, dryer sheets, fabric softeners. No new medicines or foods. The patient states that she's had sinusitis before and was treated with clindamycin with good results. She is allergic to penicillin. She takes 70/30 insulin and Zyrtec. She's had type 1 diabetes since she was 22 years old and has been under good control. Dr. Everardo All is her endocrinologist. Her last menstrual period is going on right now. She denies being pregnant or breast-feeding.  Review of Systems:  Other than noted above, the patient denies any of the following symptoms. Systemic:  No fever, chills, sweats, fatigue, myalgias, headache, or anorexia. Eye:  No redness, pain or drainage. ENT:  No earache, ear congestion, nasal congestion, sneezing, rhinorrhea, sinus pressure, sinus pain, post nasal drip, or sore throat. Lungs:  No cough, sputum production, wheezing, shortness of breath, or chest pain. GI:  No abdominal pain, nausea, vomiting, or diarrhea.  PMFSH:  Past medical history, family history, social history, meds, and allergies were reviewed.  Physical Exam:   Vital signs:  BP 120/85  Pulse 95  Temp(Src) 98.1 F (36.7 C) (Oral)  Resp 21  SpO2 99%  LMP 10/14/2012 General:  Alert, in no distress. Eye:  No conjunctival injection or drainage. Lids were normal. ENT:  TMs and canals were normal, without erythema or inflammation.  Nasal mucosa  was clear and uncongested, without drainage.  Mucous membranes were moist.  Pharynx was clear, without exudate or drainage.  There were no oral ulcerations or lesions. Neck:  Supple, no adenopathy, tenderness or mass. Lungs:  No respiratory distress.  Lungs were clear to auscultation, without wheezes, rales or rhonchi.  Breath sounds were clear and equal bilaterally.  Heart:  Regular rhythm, without gallops, murmers or rubs. Skin:  Clear, warm, and dry, she has scattered, nondescript, raised, erythematous maculopapules on the face, neck, upper back, and chest. None abdomen or extremities. None of these were vesicular.  Assessment:  The primary encounter diagnosis was Viral upper respiratory infection. A diagnosis of Dermatitis was also pertinent to this visit.  I think that the dermatitis might be a part of the viral syndrome. She has viral symptoms have only been when on for 2 days. I do not think that antibiotics are indicated right now, but she seems fairly insistent that she might need some, therefore I gave her a prescription but told her not to get the prescription filled unless the symptoms have persisted beyond one week.  Plan:   1.  The following meds were prescribed:   Discharge Medication List as of 10/14/2012  3:07 PM    START taking these medications   Details  clindamycin (CLEOCIN) 300 MG capsule Take 1 capsule (300 mg total) by mouth 4 (four) times daily., Starting 10/14/2012, Until Discontinued, Print    fexofenadine-pseudoephedrine (ALLEGRA-D) 60-120 MG per tablet Take 1 tablet by mouth every 12 (twelve) hours., Starting 10/14/2012, Until Discontinued, Normal  hydrocortisone cream 1 % Apply to affected area TID, Normal       2.  The patient was instructed in symptomatic care and handouts were given. 3.  The patient was told to return if becoming worse in any way, if no better in 3 or 4 days, and given some red flag symptoms such as fever, difficulty breathing, chest pain, or  vomiting that would indicate earlier return.   Reuben Likes, MD 10/14/12 [phone]

## 2012-10-14 NOTE — Discharge Instructions (Signed)
Most upper respiratory infections are caused by viruses and do not require antibiotics.  We try to save the antibiotics for when we really need them to avoid resistance.  This does not mean that there is nothing that can be done.  Here are a few hints about things that can be done at home to get over an upper respiratory infection quicker:  Get extra sleep and extra fluids.  Get 7 to 9 hours of sleep per night and 6 to 8 glasses of water a day.  Getting extra sleep keeps the immune system from getting run down.  Most people with an upper respiratory infection are a little dehydrated.  The extra fluids also keep the secretions liquified and easier to deal with.  Also, get extra vitamin C.  4000 mg per day is the recommended dose. For the aches, headache, and fever, acetaminophen or ibuprofen are helpful.  These can be alternated every 4 hours.  People with liver disease should avoid large amounts of acetaminophen, and people with ulcer disease, gastroesophageal reflux, gastritis, congestive heart failure, chronic kidney disease, coronary artery disease and the elderly should avoid ibuprofen. For nasal congestion try Mucinex-D, or if you're having lots of sneezing or copious clear nasal drainage Allegra-D-24 hour.  A Saline nasal spray such as Ocean Spray can also help as can decongestant sprays such as Afrin, but you should not use the decongestant sprays for more than 3 or 4 days since they can be habituating.  If nasal dryness is a problem, Ayr Nasal Gel can help moisturize your nasal passages.  Breath Rite nasal strips can also offer a non-drug alternative treatment to nasal congestion, especially at night. For people with symptoms of sinusitis, sleeping with your head elevated can be helpful.  For sinus pain, moist, hot compresses to the face may provide some relief.  Many people find that inhaling steam as in a shower or from a pot of steaming water can help. For sore throat, zinc containing lozenges such  as Cold-Eze or Zicam are helpful.  Zinc helps to fight infection and has a mild astringent effect that relieves the sore, achey throat.  Hot salt water gargles (8 oz of hot water, 1/2 tsp of table salt, and a pinch of baking soda) can give relief as well as hot beverages such as hot tea. For the cough, old time remedies such as honey or honey and lemon are tried and true.  Over the counter cough syrups such as Delsym 2 tsp every 12 hours can help as well.  It has also been found recently that Aleve can help control a cough.  The dose is 1 to 2 tablets twice daily with food.  This can be combined with Delsym. (Note, if you are taking ibuprofen, you should not take Aleve as well--take one or the other.)  It's important when you have an upper respiratory infection not to pass the infection to others.  This involves being very careful about the following:  Frequent hand washing or use of hand sanitizer, especially after coughing, sneezing, blowing your nose or touching your face, nose or eyes. Do not shake hands or touch anyone and try to avoid touching surfaces that other people use such as doorknobs, shopping carts, telephones and computer keyboards. Use tissues and dispose of them properly in a garbage can or ziplock bag. Cough into your sleeve. Do not let others eat or drink after you.  It's also important to recognize the signs of serious illness and  get evaluated if they occur: Any respiratory infection that lasts more than 7 to 10 days.  Yellow nasal drainage and sputum are not reliable indicators of a bacterial infection, but if they last for more than 1 week, see your doctor. Fever and sore throat can indicate strep. Fever and cough can indicate influenza or pneumonia. Any kind of severe symptom such as difficulty breathing, intractable vomiting, or severe pain should prompt you to see a doctor as soon as possible.   Your body's immune system is really the thing that will get rid of this  infection.  Your immune system is comprised of 2 types of specialized cells called T cells and B cells.  T cells coordinate the array of cells in your body that engulf invading bacteria or viruses while B cells orchestrate the production of antibodies that neutralize infection.  Anything we do or any medications we give you, will just strengthen your immune system or help it clear up the infection quicker.  Here are a few helpful hints to improve your immune system to help overcome this illness or to prevent future infections:  A few vitamins can improve the health of your immune system.  That's why your diet should include plenty of fruits, vegetables, fish, nuts, and whole grains.  Vitamin A and bet-carotene can increase the cells that fight infections (T cells and B cells).  Vitamin A is abundant in dark greens and orange vegetables such as spinach, greens, sweet potatoes, and carrots.  Vitamin B6 contributes to the maturation of white blood cells, the cells that fight disease.  Foods with vitamin B6 include cold cereal and bananas.  Vitamin C is credited with preventing colds because it increases white blood cells and also prevents cellular damage.  Citrus fruits, peaches and green and red bell peppers are all hight in vitamin C.  Vitamin E is an anti-oxidant that encourages the production of natural killer cells which reject foreign invaders and B cells that produce antibodies.  Foods high in vitamin E include wheat germ, nuts and seeds.  Foods high in omega-3 fatty acids found in foods like salmon, tuna and mackerel boost your immune system and help cells to engulf and absorb germs.  Probiotics are good bacteria that increase your T cells.  These can be found in yogurt and are available in supplements such as Culturelle or Align.  Moderate exercise increases the strength of your immune system and your ability to recover from illness.  I suggest 3 to 5 moderate intensity 30 minute workouts per  week.    Sleep is another component of maintaining a strong immune system.  It enables your body to recuperate from the day's activities, stress and work.  My recommendation is to get between 7 and 9 hours of sleep per night.  If you smoke, try to quit completely or at least cut down.  Drink alcohol only in moderation if at all.  No more than 2 drinks daily for men or 1 for women.  Get a flu vaccine early in the fall or if you have not gotten one yet, once this illness has run its course.  If you are over 65, a smoker, or an asthmatic, get a pneumococcal vaccine.  My final recommendation is to maintain a healthy weight.  Excess weight can impair the immune system by interfering with the way the immune system deals with invading viruses or bacteria.   Do not get antibiotic filled unless symptoms have persisted for more than  7 days total.

## 2012-10-23 ENCOUNTER — Emergency Department (INDEPENDENT_AMBULATORY_CARE_PROVIDER_SITE_OTHER)
Admission: EM | Admit: 2012-10-23 | Discharge: 2012-10-23 | Disposition: A | Discharge status: Home / Self Care | Payer: Medicaid Other | Source: Home / Self Care

## 2012-10-23 ENCOUNTER — Encounter (HOSPITAL_COMMUNITY): Payer: Self-pay | Admitting: *Deleted

## 2012-10-23 ENCOUNTER — Emergency Department (HOSPITAL_COMMUNITY)
Admission: EM | Admit: 2012-10-23 | Discharge: 2012-10-23 | Discharge status: Against Medical Advice | Payer: Medicaid Other | Attending: Emergency Medicine | Admitting: Emergency Medicine

## 2012-10-23 ENCOUNTER — Encounter (HOSPITAL_COMMUNITY): Payer: Self-pay | Admitting: Emergency Medicine

## 2012-10-23 DIAGNOSIS — Z760 Encounter for issue of repeat prescription: Secondary | ICD-10-CM | POA: Insufficient documentation

## 2012-10-23 DIAGNOSIS — E109 Type 1 diabetes mellitus without complications: Secondary | ICD-10-CM

## 2012-10-23 MED ORDER — INSULIN NPH ISOPHANE & REGULAR (70-30) 100 UNIT/ML ~~LOC~~ SUSP: 32.0000 [IU] | Freq: Every day | SUBCUTANEOUS | Status: DC

## 2012-10-23 MED ORDER — INSULIN NPH ISOPHANE & REGULAR (70-30) 100 UNIT/ML ~~LOC~~ SUSP: 34.0000 [IU] | Freq: Every day | SUBCUTANEOUS | Status: DC

## 2012-10-23 NOTE — ED Notes (Signed)
Pt sts ran out of insulin yesterday; pt sts uses 70/30

## 2012-10-23 NOTE — ED Notes (Signed)
No answer in lobby.

## 2012-10-23 NOTE — ED Provider Notes (Signed)
History     CSN: [id number]  Arrival date & time 10/23/12  1015   First MD Initiated Contact with Patient 10/23/12 1046      Chief Complaint  Patient presents with  . Medication Refill    (Consider location/radiation/quality/duration/timing/severity/associated sxs/prior treatment) HPI This is a 22 year old female with diabetes mellitus type 1 for the past 8 years. Her last A1c in October was noted to be close to 12. She was started on insulin at that time and comes in today for refills. She states that her blood sugars in the morning prior to breakfast are usually about 170 and in the evening prior to dinner are about 140. She is taking her insulin appropriately and otherwise has no issues with neuropathy, nausea after meals or visual disturbances. She is trying to find an endocrinologist but states that there are no openings anywhere.  Past Medical History  Diagnosis Date  . 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 ALLERGIC RHINITIS 05/07/2010  . DKA (diabetic ketoacidoses)     Past Surgical History  Procedure Laterality Date  . Tonsillectomy      Family History  Problem Relation Age of Onset  . Diabetes Neg Hx     History  Substance Use Topics  . Smoking status: Never Smoker   . Smokeless tobacco: Never Used  . Alcohol Use: No    OB History   Grav Para Term Preterm Abortions TAB SAB Ect Mult Living                  Review of Systems  Constitutional: Negative.   HENT: Negative.   Eyes: Negative.   Respiratory: Negative.   Cardiovascular: Negative.   Endocrine: Negative.   Genitourinary: Negative.   Musculoskeletal: Negative.   Skin: Negative.   Neurological: Negative.   Hematological: Negative.   Psychiatric/Behavioral: Negative.     Allergies  Banana; Orange concentrate; and Penicillins  Home Medications   Current Outpatient Rx  Name  Route  Sig  Dispense  Refill  . cetirizine (ZYRTEC) 10 MG tablet   Oral   Take 10 mg by mouth daily.           . clindamycin (CLEOCIN) 300 MG capsule   Oral   Take 1 capsule (300 mg total) by mouth 4 (four) times daily.   40 capsule   0   . fexofenadine-pseudoephedrine (ALLEGRA-D) 60-120 MG per tablet   Oral   Take 1 tablet by mouth every 12 (twelve) hours.   30 tablet   0   . hydrocortisone cream 1 %      Apply to affected area TID   85.2 g   0   . insulin NPH-insulin regular (HUMULIN 70/30 PEN) (70-30) 100 UNIT/ML injection   Subcutaneous   Inject 30 Units into the skin 2 (two) times daily with a meal. Take 30 units with breakfast and 30 units with supper.  You must eat lunch everyday to avoid a low BS at lunchtime.  Check your BS 4 times per day.   15 mL   3   . insulin NPH-insulin regular (HUMULIN 70/30 PEN) (70-30) 100 UNIT/ML injection   Subcutaneous   Inject 34 Units into the skin daily with supper.   10 mL   12   . insulin NPH-insulin regular (HUMULIN 70/30 PEN) (70-30) 100 UNIT/ML injection   Subcutaneous   Inject 32 Units into the skin daily with breakfast.   10 mL  12     BP 117/64  Pulse 67  Temp(Src) 97.7 F (36.5 C) (Oral)  Resp 12  SpO2 100%  LMP 10/14/2012  Physical Exam  Constitutional: She is oriented to person, place, and time. She appears well-developed and well-nourished.  HENT:  Head: Normocephalic and atraumatic.  Eyes: Conjunctivae and EOM are normal. Pupils are equal, round, and reactive to light.  Neck: Normal range of motion. Neck supple.  Cardiovascular: Normal rate and regular rhythm.   Pulmonary/Chest: Effort normal and breath sounds normal.  Abdominal: Soft. Bowel sounds are normal.  Musculoskeletal: Normal range of motion.  Neurological: She is alert and oriented to person, place, and time.  Skin: Skin is warm and dry.  Psychiatric: She has a normal mood and affect.    ED Course  Procedures (including critical care time)  Labs Reviewed - No data to display No results found.   1. DM  type 1, not at goal       MDM  Refills given for Humulin 70/30. Dose increased slightly to 32 units in the morning and 34 units with dinner. Patient is advised to increase dose by 2 units every other day until goal blood sugar of 100 has been reached. In addition I've referred her to [facility] endocrinology.        Calvert Cantor, MD 10/23/12 1119

## 2012-10-23 NOTE — ED Notes (Signed)
Pt  Verbalizes  No  Symptoms  Other  Than  The  Fact  That  She  Ran out of  Her  Insulin  Yesterday       -  She  At  This  Time  Is  Sitting  Upright on  Exam table  In  No  Acute  Distress                Skin is  Warm  And  Dry

## 2012-12-04 ENCOUNTER — Encounter (HOSPITAL_COMMUNITY): Payer: Self-pay

## 2012-12-04 ENCOUNTER — Emergency Department (HOSPITAL_COMMUNITY)
Admission: EM | Admit: 2012-12-04 | Discharge: 2012-12-05 | Disposition: A | Discharge status: Home / Self Care | Payer: Medicaid Other | Source: Home / Self Care | Attending: Emergency Medicine | Admitting: Emergency Medicine

## 2012-12-04 DIAGNOSIS — E1169 Type 2 diabetes mellitus with other specified complication: Secondary | ICD-10-CM | POA: Insufficient documentation

## 2012-12-04 DIAGNOSIS — Z79899 Other long term (current) drug therapy: Secondary | ICD-10-CM | POA: Insufficient documentation

## 2012-12-04 DIAGNOSIS — R05 Cough: Secondary | ICD-10-CM | POA: Insufficient documentation

## 2012-12-04 DIAGNOSIS — J309 Allergic rhinitis, unspecified: Secondary | ICD-10-CM | POA: Insufficient documentation

## 2012-12-04 DIAGNOSIS — Z794 Long term (current) use of insulin: Secondary | ICD-10-CM | POA: Insufficient documentation

## 2012-12-04 DIAGNOSIS — R6889 Other general symptoms and signs: Secondary | ICD-10-CM | POA: Insufficient documentation

## 2012-12-04 DIAGNOSIS — J069 Acute upper respiratory infection, unspecified: Secondary | ICD-10-CM | POA: Insufficient documentation

## 2012-12-04 DIAGNOSIS — J3489 Other specified disorders of nose and nasal sinuses: Secondary | ICD-10-CM | POA: Insufficient documentation

## 2012-12-04 DIAGNOSIS — R059 Cough, unspecified: Secondary | ICD-10-CM | POA: Insufficient documentation

## 2012-12-04 DIAGNOSIS — R739 Hyperglycemia, unspecified: Secondary | ICD-10-CM

## 2012-12-04 DIAGNOSIS — J302 Other seasonal allergic rhinitis: Secondary | ICD-10-CM

## 2012-12-04 DIAGNOSIS — E1110 Type 2 diabetes mellitus with ketoacidosis without coma: Secondary | ICD-10-CM | POA: Insufficient documentation

## 2012-12-04 DIAGNOSIS — I7300 Raynaud's syndrome without gangrene: Secondary | ICD-10-CM | POA: Insufficient documentation

## 2012-12-04 LAB — BASIC METABOLIC PANEL
BUN: 12 mg/dL (ref 6–23)
GFR calc Af Amer: >90 mL/min (ref >90)
GFR calc non Af Amer: >90 mL/min (ref >90)
Potassium: 3.9 mEq/L (ref 3.5–5.1)
Sodium: 132 mEq/L — ABNORMAL LOW (ref 135–145)

## 2012-12-04 LAB — POCT I-STAT 3, VENOUS BLOOD GAS (G3P V)
Acid-base deficit: 1 mmol/L (ref 0.0–2.0)
O2 Saturation: 43 %
pCO2, Ven: 44.8 mmHg — ABNORMAL LOW (ref 45.0–50.0)

## 2012-12-04 LAB — GLUCOSE, CAPILLARY: Glucose-Capillary: 336 mg/dL — ABNORMAL HIGH (ref 70–99)

## 2012-12-04 MED ORDER — SODIUM CHLORIDE 0.9 % IV SOLN
1000.0000 mL | Freq: Once | INTRAVENOUS | Status: AC
  Administered 2012-12-04: 1000 mL via INTRAVENOUS

## 2012-12-04 MED ORDER — SODIUM CHLORIDE 0.9 % IV SOLN: 1000.0000 mL | INTRAVENOUS | Status: DC

## 2012-12-04 NOTE — ED Notes (Signed)
Patient presents with c/o URI (congestion and productive cough with yellow sputum)  x 1 week. Used OTC mucinex with some relief. Denies fevers, sweats or chills. Hx type I DM. On 30 units of 70/30 BID. Does not check CBG regularly but states that "I don't feel well." Reports CBG this AM 368. Before this AM, patient checked sugar 4 days ago. Reports polyuria, increased thirst. Denies any syncope, weakness or dizziness.   CBG at triage 336

## 2012-12-05 ENCOUNTER — Inpatient Hospital Stay (HOSPITAL_COMMUNITY)
Admission: EM | Admit: 2012-12-05 | Discharge: 2012-12-07 | DRG: 639 | Discharge status: Against Medical Advice | Payer: Medicaid Other | Attending: Internal Medicine | Admitting: Internal Medicine

## 2012-12-05 ENCOUNTER — Encounter (HOSPITAL_COMMUNITY): Payer: Self-pay | Admitting: *Deleted

## 2012-12-05 ENCOUNTER — Emergency Department (HOSPITAL_COMMUNITY): Payer: Medicaid Other

## 2012-12-05 DIAGNOSIS — J309 Allergic rhinitis, unspecified: Secondary | ICD-10-CM | POA: Diagnosis present

## 2012-12-05 DIAGNOSIS — Z91199 Patient's noncompliance with other medical treatment and regimen due to unspecified reason: Secondary | ICD-10-CM

## 2012-12-05 DIAGNOSIS — J069 Acute upper respiratory infection, unspecified: Secondary | ICD-10-CM

## 2012-12-05 DIAGNOSIS — E1010 Type 1 diabetes mellitus with ketoacidosis without coma: Principal | ICD-10-CM | POA: Diagnosis present

## 2012-12-05 DIAGNOSIS — R1115 Cyclical vomiting syndrome unrelated to migraine: Secondary | ICD-10-CM

## 2012-12-05 DIAGNOSIS — E1110 Type 2 diabetes mellitus with ketoacidosis without coma: Secondary | ICD-10-CM

## 2012-12-05 DIAGNOSIS — Z794 Long term (current) use of insulin: Secondary | ICD-10-CM

## 2012-12-05 DIAGNOSIS — R1110 Vomiting, unspecified: Secondary | ICD-10-CM | POA: Diagnosis present

## 2012-12-05 DIAGNOSIS — Z9119 Patient's noncompliance with other medical treatment and regimen: Secondary | ICD-10-CM

## 2012-12-05 DIAGNOSIS — E109 Type 1 diabetes mellitus without complications: Secondary | ICD-10-CM

## 2012-12-05 LAB — URINALYSIS, ROUTINE W REFLEX MICROSCOPIC
Bilirubin Urine: NEGATIVE
Bilirubin Urine: NEGATIVE
Nitrite: NEGATIVE
Nitrite: NEGATIVE
Specific Gravity, Urine: 1.031 — ABNORMAL HIGH (ref 1.005–1.030)
Specific Gravity, Urine: 1.041 — ABNORMAL HIGH (ref 1.005–1.030)
Urobilinogen, UA: 0.2 mg/dL (ref 0.0–1.0)
Urobilinogen, UA: 0.2 mg/dL (ref 0.0–1.0)

## 2012-12-05 LAB — COMPREHENSIVE METABOLIC PANEL
Alkaline Phosphatase: 122 U/L — ABNORMAL HIGH (ref 39–117)
BUN: 11 mg/dL (ref 6–23)
Calcium: 9.7 mg/dL (ref 8.4–10.5)
GFR calc Af Amer: >90 mL/min (ref >90)
Glucose, Bld: 448 mg/dL — ABNORMAL HIGH (ref 70–99)
Total Protein: 8 g/dL (ref 6.0–8.3)

## 2012-12-05 LAB — CBC WITH DIFFERENTIAL/PLATELET
Basophils Relative: 0 % (ref 0–1)
Eosinophils Absolute: 0 10*3/uL (ref 0.0–0.7)
HCT: 40.5 % (ref 36.0–46.0)
Hemoglobin: 13.2 g/dL (ref 12.0–15.0)
MCH: 23 pg — ABNORMAL LOW (ref 26.0–34.0)
MCHC: 32.6 g/dL (ref 30.0–36.0)
Monocytes Absolute: 0.4 10*3/uL (ref 0.1–1.0)
Neutro Abs: 8.4 10*3/uL — ABNORMAL HIGH (ref 1.7–7.7)

## 2012-12-05 LAB — GLUCOSE, CAPILLARY: Glucose-Capillary: 356 mg/dL — ABNORMAL HIGH (ref 70–99)

## 2012-12-05 LAB — URINE MICROSCOPIC-ADD ON

## 2012-12-05 LAB — POCT I-STAT, CHEM 8
Calcium, Ion: 1.21 mmol/L (ref 1.12–1.23)
Chloride: 106 mEq/L (ref 96–112)
HCT: 45 % (ref 36.0–46.0)
Potassium: 4.8 mEq/L (ref 3.5–5.1)

## 2012-12-05 LAB — POCT I-STAT 3, VENOUS BLOOD GAS (G3P V): Acid-base deficit: 15 mmol/L — ABNORMAL HIGH (ref 0.0–2.0)

## 2012-12-05 LAB — LIPASE, BLOOD: Lipase: 10 U/L — ABNORMAL LOW (ref 11–59)

## 2012-12-05 MED ORDER — SODIUM CHLORIDE 0.9 % IV SOLN
INTRAVENOUS | Status: DC
  Filled 2012-12-05: qty 1

## 2012-12-05 MED ORDER — GI COCKTAIL ~~LOC~~
30.0000 mL | Freq: Once | ORAL | Status: DC
  Filled 2012-12-05: qty 30

## 2012-12-05 MED ORDER — POTASSIUM CHLORIDE 10 MEQ/100ML IV SOLN: 10.0000 meq | Freq: Once | INTRAVENOUS | Status: DC

## 2012-12-05 MED ORDER — MOMETASONE FUROATE 50 MCG/ACT NA SUSP: 2.0000 | Freq: Every day | NASAL | Status: DC

## 2012-12-05 MED ORDER — ONDANSETRON HCL 4 MG/2ML IJ SOLN
4.0000 mg | Freq: Three times a day (TID) | INTRAMUSCULAR | Status: AC | PRN
  Filled 2012-12-05: qty 2

## 2012-12-05 MED ORDER — SODIUM CHLORIDE 0.9 % IV SOLN
1000.0000 mL | INTRAVENOUS | Status: DC
  Administered 2012-12-05: 1000 mL via INTRAVENOUS

## 2012-12-05 MED ORDER — INSULIN ASPART 100 UNIT/ML ~~LOC~~ SOLN
15.0000 [IU] | Freq: Once | SUBCUTANEOUS | Status: AC
  Administered 2012-12-05: 15 [IU] via INTRAVENOUS
  Filled 2012-12-05: qty 1

## 2012-12-05 MED ORDER — SODIUM CHLORIDE 0.9 % IV SOLN
1000.0000 mL | Freq: Once | INTRAVENOUS | Status: AC
  Administered 2012-12-05: 1000 mL via INTRAVENOUS

## 2012-12-05 MED ORDER — ONDANSETRON 4 MG PO TBDP
8.0000 mg | ORAL_TABLET | Freq: Once | ORAL | Status: AC
  Administered 2012-12-05: 8 mg via ORAL
  Filled 2012-12-05: qty 2

## 2012-12-05 MED ORDER — SODIUM CHLORIDE 0.9 % IV SOLN
INTRAVENOUS | Status: DC
  Administered 2012-12-05: 3.9 [IU]/h via INTRAVENOUS
  Filled 2012-12-05: qty 1

## 2012-12-05 MED ORDER — ONDANSETRON HCL 4 MG/2ML IJ SOLN
4.0000 mg | Freq: Once | INTRAMUSCULAR | Status: AC
  Administered 2012-12-05: 4 mg via INTRAVENOUS
  Filled 2012-12-05: qty 2

## 2012-12-05 NOTE — ED Notes (Signed)
IV attempt unsuccessful. IV team paged

## 2012-12-05 NOTE — ED Provider Notes (Signed)
History    CSN: [id number] Arrival date & time 12/05/12  1818 MD Initiated Contact with Patient 12/05/12 1851      Chief Complaint  Patient presents with  . Blood Sugar Problem  . Emesis  . Abdominal Pain    HPI Patient presents to the emergency room with complaints of malaise, persistent hyperglycemia and nausea and vomiting. Patient has history of diabetes and DKA. She started noticing symptoms of nasal congestion as well as elevated blood sugar of the last couple weeks.  The patient saw her primary Dr. and was started on a new diabetes regimen including Levemir.  Patient started having trouble with nausea and vomiting. She went to the emergency room at last evening and was evaluated overnight and released in the morning.  The patient was found to be hyperglycemic without DKA. She was given insulin and her blood sugar went down into the 200s. Patient states she has not been able to eat today and her sugar has continued to rise. She has had persistent nausea vomiting and is feeling worse than when she left. Is generalized abdominal cramping. She denies any diarrhea. She denies any dysuria. She has not had any fevers chest pain or shortness of breath. Past Medical History  Diagnosis Date  . 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 ALLERGIC RHINITIS 05/07/2010  . DKA (diabetic ketoacidoses)     Past Surgical History  Procedure Laterality Date  . Tonsillectomy      Family History  Problem Relation Age of Onset  . Diabetes Neg Hx     History  Substance Use Topics  . Smoking status: Never Smoker   . Smokeless tobacco: Never Used  . Alcohol Use: No    OB History   Grav Para Term Preterm Abortions TAB SAB Ect Mult Living                  Review of Systems  All other systems reviewed and are negative.    Allergies  Banana; Orange concentrate; and Penicillins  Home Medications   Current Outpatient Rx  Name  Route  Sig  Dispense   Refill  . cetirizine (ZYRTEC) 10 MG tablet   Oral   Take 10 mg by mouth daily.           Marland Kitchen guaiFENesin (MUCINEX) 600 MG 12 hr tablet   Oral   Take 1,200 mg by mouth 2 (two) times daily.         . insulin NPH-insulin regular (HUMULIN 70/30 PEN) (70-30) 100 UNIT/ML injection   Subcutaneous   Inject 30 Units into the skin 2 (two) times daily with a meal. Take 30 units with breakfast and 30 units with supper.  You must eat lunch everyday to avoid a low BS at lunchtime.  Check your BS 4 times per day.   15 mL   3   . mometasone (NASONEX) 50 MCG/ACT nasal spray   Nasal   Place 2 sprays into the nose daily.   17 g   12     BP 130/58  Pulse 107  Temp(Src) 0 F (-17.8 C)  Resp 16  SpO2 100%  LMP 11/07/2012  Physical Exam  Nursing note and vitals reviewed. Constitutional: She appears well-developed and well-nourished. She appears ill.  HENT:  Head: Normocephalic and atraumatic.  Right Ear: External ear normal.  Left Ear: External ear normal.  Eyes: Conjunctivae are normal. Right eye exhibits no discharge. Left eye exhibits  no discharge. No scleral icterus.  Neck: Neck supple. No tracheal deviation present.  Cardiovascular: Regular rhythm and intact distal pulses.  Tachycardia present.   Pulmonary/Chest: Effort normal and breath sounds normal. No stridor. No respiratory distress. She has no wheezes. She has no rales.  Abdominal: Soft. Bowel sounds are normal. She exhibits no distension. There is generalized tenderness. There is no rigidity, no rebound and no guarding. No hernia.  Musculoskeletal: She exhibits no edema and no tenderness.  Neurological: She is alert. She has normal strength. No sensory deficit. Cranial nerve deficit:  no gross defecits noted. She exhibits normal muscle tone. She displays no seizure activity. Coordination normal.  Skin: Skin is warm and dry. No rash noted.  Psychiatric: She has a normal mood and affect.    ED Course  Procedures (including  critical care time) Nursing staff was unable to obtain IV access.  I placed a peripheral IV under ultrasound guidance in the left antecubital fossa.  Pt tolerated the procedure well.  Good return and flush.  Medications  insulin regular (NOVOLIN R,HUMULIN R) 1 Units/mL in sodium chloride 0.9 % 100 mL infusion (3 Units/hr Intravenous Rate/Dose Change 12/05/12 2258)  0.9 %  sodium chloride infusion (0 mLs Intravenous Stopped 12/05/12 2147)    Followed by  0.9 %  sodium chloride infusion (0 mLs Intravenous Stopped 12/05/12 2147)    Followed by  0.9 %  sodium chloride infusion (1,000 mLs Intravenous New Bag/Given 12/05/12 2148)  insulin regular (NOVOLIN R,HUMULIN R) 1 Units/mL in sodium chloride 0.9 % 100 mL infusion (3.9 Units/hr Intravenous New Bag/Given 12/05/12 2153)  potassium chloride 10 mEq in 100 mL IVPB (not administered)  ondansetron (ZOFRAN) injection 4 mg (not administered)  gi cocktail (Maalox,Lidocaine,Donnatal) (not administered)  ondansetron (ZOFRAN) injection 4 mg (4 mg Intravenous Given 12/05/12 2047)   11:46 PM Will add potassium infusion with the insulin drip.  CRITICAL CARE Performed by: Celene Kras Total critical care time: 45 Critical care time was exclusive of separately billable procedures and treating other patients. Critical care was necessary to treat or prevent imminent or life-threatening deterioration. Critical care was time spent personally by me on the following activities: development of treatment plan with patient and/or surrogate as well as nursing, discussions with consultants, evaluation of patient's response to treatment, examination of patient, obtaining history from patient or surrogate, ordering and performing treatments and interventions, ordering and review of laboratory studies, ordering and review of radiographic studies, pulse oximetry and re-evaluation of patient's condition.   Labs Reviewed  CBC WITH DIFFERENTIAL - Abnormal; Notable for the  following:    RBC 5.74 (*)    MCV 70.6 (*)    MCH 23.0 (*)    Neutrophils Relative 84 (*)    Neutro Abs 8.4 (*)    All other components within normal limits  COMPREHENSIVE METABOLIC PANEL - Abnormal; Notable for the following:    Sodium 128 (*)    Chloride 94 (*)    CO2 11 (*)    Glucose, Bld 448 (*)    Alkaline Phosphatase 122 (*)    All other components within normal limits  LIPASE, BLOOD - Abnormal; Notable for the following:    Lipase 10 (*)    All other components within normal limits  URINALYSIS, ROUTINE W REFLEX MICROSCOPIC - Abnormal; Notable for the following:    Specific Gravity, Urine 1.031 (*)    Glucose, UA >1000 (*)    Hgb urine dipstick LARGE (*)    Ketones, ur >80 (*)  All other components within normal limits  GLUCOSE, CAPILLARY - Abnormal; Notable for the following:    Glucose-Capillary 415 (*)    All other components within normal limits  GLUCOSE, CAPILLARY - Abnormal; Notable for the following:    Glucose-Capillary 356 (*)    All other components within normal limits  POCT I-STAT 3, BLOOD GAS (G3P V) - Abnormal; Notable for the following:    pH, Ven 7.198 (*)    pCO2, Ven 28.5 (*)    pO2, Ven 53.0 (*)    Bicarbonate 11.1 (*)    Acid-base deficit 15.0 (*)    All other components within normal limits  POCT I-STAT, CHEM 8 - Abnormal; Notable for the following:    Sodium 132 (*)    Glucose, Bld 455 (*)    Hemoglobin 15.3 (*)    All other components within normal limits  URINE MICROSCOPIC-ADD ON   Dg Chest Portable 1 View  12/05/2012  *RADIOLOGY REPORT*  Clinical Data: Shortness of breath and pain.  PORTABLE CHEST - 1 VIEW  Comparison: 06/03/2011 chest radiograph  Findings: The cardiomediastinal silhouette is unremarkable. The lungs are clear. There is no evidence of focal airspace disease, pulmonary edema, suspicious pulmonary nodule/mass, pleural effusion, or pneumothorax. No acute bony abnormalities are identified.  IMPRESSION: No evidence of active  cardiopulmonary disease.   Original Report Authenticated By: Harmon Pier, M.D.      1. DKA (diabetic ketoacidoses)   2. Intractable vomiting   3. Personal history of noncompliance with medical treatment, presenting hazards to health   4. Type I (juvenile type) diabetes mellitus without mention of complication, not stated as uncontrolled   5. URI (upper respiratory infection)       MDM  Patient presents with recurrent DKA. Her laboratory tests are significantly changed compared to earlier today.  IV fluid hydration has been started. An insulin infusion has also been ordered. We'll consult with the medical service regarding admission.  The patient appears stable for stepdown unit.        Celene Kras, MD 12/05/12 [phone]

## 2012-12-05 NOTE — ED Notes (Signed)
Pt is juvenile dm and has been vomiting since last nite with abdominal pain and does not feel well

## 2012-12-05 NOTE — ED Notes (Signed)
Pt transported to floor with RN. Pt stable. Alert and oriented

## 2012-12-05 NOTE — ED Notes (Signed)
Patient unwilling to take GI Cocktail at this time. Drowsy. States nausea has improved

## 2012-12-05 NOTE — H&P (Signed)
PCP:   Geraldo Pitter, MD   Chief Complaint:  n/v  HPI: 22 yo type 1 female diabetic comes in with over 24 hours of uri symptoms, nasal congestion came to ED last night was not in dka but then today started n/v several times and not able to eat.  No fevers.  No cough.  No dysuria. h aving epigastric abd pain.  Sugars have been over 400.  Review of Systems:  Positive and negative as per HPI otherwise all other systems are negative  Past Medical History: Past Medical History  Diagnosis Date  . 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 ALLERGIC RHINITIS 05/07/2010  . DKA (diabetic ketoacidoses)    Past Surgical History  Procedure Laterality Date  . Tonsillectomy      Medications: Prior to Admission medications   Medication Sig Start Date End Date Taking? Authorizing Provider  cetirizine (ZYRTEC) 10 MG tablet Take 10 mg by mouth daily.     Yes Historical Provider, MD  guaiFENesin (MUCINEX) 600 MG 12 hr tablet Take 1,200 mg by mouth 2 (two) times daily.   Yes Historical Provider, MD  insulin NPH-insulin regular (HUMULIN 70/30 PEN) (70-30) 100 UNIT/ML injection Inject 30 Units into the skin 2 (two) times daily with a meal. Take 30 units with breakfast and 30 units with supper.  You must eat lunch everyday to avoid a low BS at lunchtime.  Check your BS 4 times per day. 06/08/12 06/08/13 Yes Shanker Levora Dredge, MD  mometasone (NASONEX) 50 MCG/ACT nasal spray Place 2 sprays into the nose daily. 12/05/12  Yes Olivia Mackie, MD    Allergies:   Allergies  Allergen Reactions  . Banana Hives    Peas and cauliflower  . Orange Concentrate Information systems manager) Hives  . Penicillins Swelling    hives    Social History:  reports that she has never smoked. She has never used smokeless tobacco. She reports that she does not drink alcohol or use illicit drugs.  Family History: Family History  Problem Relation Age of Onset  . Diabetes Neg Hx     Physical  Exam: Filed Vitals:   12/05/12 1841  BP: 143/70  Pulse: 112  Resp: 22  SpO2: 98%   General appearance: alert, cooperative and no distress Head: Normocephalic, without obvious abnormality, atraumatic Eyes: negative Nose: Nares normal. Septum midline. Mucosa normal. No drainage or sinus tenderness. Neck: no JVD and supple, symmetrical, trachea midline Lungs: clear to auscultation bilaterally Heart: regular rate and rhythm, S1, S2 normal, no murmur, click, rub or gallop Abdomen: soft nd ttp epi , bs normal. no r/g nonacute abd Extremities: extremities normal, atraumatic, no cyanosis or edema Pulses: 2+ and symmetric Skin: Skin color, texture, turgor normal. No rashes or lesions Neurologic: Grossly normal    Labs on Admission:   Recent Labs  12/04/12 2309 12/05/12 1916 12/05/12 1931  NA 132* 128* 132*  K 3.9 4.7 4.8  CL 97 94* 106  CO2 25 11*  --   GLUCOSE 404* 448* 455*  BUN 12 11 11   CREATININE 0.60 0.65 0.60  CALCIUM 9.1 9.7  --     Recent Labs  12/05/12 1916  AST 24  ALT 14  ALKPHOS 122*  BILITOT 0.3  PROT 8.0  ALBUMIN 3.9    Recent Labs  12/05/12 1916  LIPASE 10*    Recent Labs  12/05/12 1916 12/05/12 1931  WBC 10.0  --   NEUTROABS 8.4*  --  HGB 13.2 15.3*  HCT 40.5 45.0  MCV 70.6*  --   PLT 284  --    Radiological Exams on Admission: Dg Chest Portable 1 View  12/05/2012  *RADIOLOGY REPORT*  Clinical Data: Shortness of breath and pain.  PORTABLE CHEST - 1 VIEW  Comparison: 06/03/2011 chest radiograph  Findings: The cardiomediastinal silhouette is unremarkable. The lungs are clear. There is no evidence of focal airspace disease, pulmonary edema, suspicious pulmonary nodule/mass, pleural effusion, or pneumothorax. No acute bony abnormalities are identified.  IMPRESSION: No evidence of active cardiopulmonary disease.   Original Report Authenticated By: Harmon Pier, M.D.     Assessment/Plan 22 yo female with dka and recent uri  Principal  Problem:   DKA (diabetic ketoacidoses) Active Problems:   IDDM   PERS HX NONCOMPLIANCE W/MED TX PRS HAZARDS HLTH   URI (upper respiratory infection)   Intractable vomiting  Insulin gtt in stepdown.  On dka pathway.  ua is pending along with upt.  Wbc normal, no fever, no foci of bacterial infection, uri viral.  Place on protonix iv and gi cocktail, lfts and lipase are normal.  Ivf.  Admit to stepdown and full code.  Cortana Vanderford A 12/05/2012, 9:21 PM

## 2012-12-05 NOTE — ED Provider Notes (Signed)
History     CSN: [id number]  Arrival date & time 12/04/12  2004   First MD Initiated Contact with Patient 12/04/12 2256      Chief Complaint  Patient presents with  . URI  . Hyperglycemia    (Consider location/radiation/quality/duration/timing/severity/associated sxs/prior treatment) HPI 22 year old female presents to emergency room with complaint of one week of sneezing, Raynaud's, nasal congestion.  She has been taking Zyrtec and Mucinex with slight improvement in symptoms.  She reports her blood sugars have been elevated over the last 2 weeks.  Elevated for her is in the 300s.  She reports she is taking her Lantus as directed.  She was seen by her primary care physician, Dr. Parke Simmers this past Thursday, 6 days ago, and given prescription for Levemir, and Allegra.  She has not yet filled the Allegra, as she is already taking Zyrtec.  She has not started taking Levemir, as she says that her primary care doctor wanted her to wait until Thursday, and see if her blood sugars were improving on their own.  Patient has history of DKA.  She reports checking her sugar earlier today, and it was 368.  She does not check her sugars regularly.  She reports she tries to stick to her diabetic diet.  She has had polyuria and increased thirst.  She denies any dizziness, weakness, headache, vomiting, or nausea.no fevers or chills.  She has a dry, nonproductive cough Past Medical History  Diagnosis Date  . 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 ALLERGIC RHINITIS 05/07/2010  . DKA (diabetic ketoacidoses)     Past Surgical History  Procedure Laterality Date  . Tonsillectomy      Family History  Problem Relation Age of Onset  . Diabetes Neg Hx     History  Substance Use Topics  . Smoking status: Never Smoker   . Smokeless tobacco: Never Used  . Alcohol Use: No    OB History   Grav Para Term Preterm Abortions TAB SAB Ect Mult Living                   Review of Systems  See History of Present Illness; otherwise all other systems are reviewed and negative Allergies  Banana; Orange concentrate; and Penicillins  Home Medications   Current Outpatient Rx  Name  Route  Sig  Dispense  Refill  . cetirizine (ZYRTEC) 10 MG tablet   Oral   Take 10 mg by mouth daily.           Marland Kitchen guaiFENesin (MUCINEX) 600 MG 12 hr tablet   Oral   Take 1,200 mg by mouth 2 (two) times daily.         . insulin NPH-insulin regular (HUMULIN 70/30 PEN) (70-30) 100 UNIT/ML injection   Subcutaneous   Inject 30 Units into the skin 2 (two) times daily with a meal. Take 30 units with breakfast and 30 units with supper.  You must eat lunch everyday to avoid a low BS at lunchtime.  Check your BS 4 times per day.   15 mL   3   . mometasone (NASONEX) 50 MCG/ACT nasal spray   Nasal   Place 2 sprays into the nose daily.   17 g   12     BP 102/62  Pulse 89  Temp(Src) 98 F (36.7 C) (Oral)  Resp 16  SpO2 100%  LMP 11/07/2012  Physical Exam  Nursing note and vitals reviewed.  Constitutional: She is oriented to person, place, and time. She appears well-developed and well-nourished.  HENT:  Head: Normocephalic and atraumatic.  Right Ear: External ear normal.  Left Ear: External ear normal.  Mouth/Throat: Oropharynx is clear and moist.  Rhinorrhea, inflamed turbinates, congestion noted  Eyes: Conjunctivae and EOM are normal. Pupils are equal, round, and reactive to light.  Neck: Normal range of motion. Neck supple. No JVD present. No tracheal deviation present. No thyromegaly present.  Cardiovascular: Normal rate, regular rhythm, normal heart sounds and intact distal pulses.  Exam reveals no gallop and no friction rub.   No murmur heard. Pulmonary/Chest: Effort normal and breath sounds normal. No stridor. No respiratory distress. She has no wheezes. She has no rales. She exhibits no tenderness.  Abdominal: Soft. Bowel sounds are normal. She exhibits no  distension and no mass. There is no tenderness. There is no rebound and no guarding.  Musculoskeletal: Normal range of motion. She exhibits no edema and no tenderness.  Lymphadenopathy:    She has no cervical adenopathy.  Neurological: She is alert and oriented to person, place, and time. No cranial nerve deficit. She exhibits normal muscle tone. Coordination normal.  Skin: Skin is warm and dry. No rash noted. No erythema. No pallor.  Psychiatric: She has a normal mood and affect. Her behavior is normal. Judgment and thought content normal.    ED Course  Procedures (including critical care time)  Labs Reviewed  GLUCOSE, CAPILLARY - Abnormal; Notable for the following:    Glucose-Capillary 336 (*)    All other components within normal limits  BASIC METABOLIC PANEL - Abnormal; Notable for the following:    Sodium 132 (*)    Glucose, Bld 404 (*)    All other components within normal limits  URINALYSIS, ROUTINE W REFLEX MICROSCOPIC - Abnormal; Notable for the following:    Specific Gravity, Urine 1.041 (*)    Glucose, UA >1000 (*)    Hgb urine dipstick TRACE (*)    Ketones, ur 40 (*)    All other components within normal limits  URINE MICROSCOPIC-ADD ON - Abnormal; Notable for the following:    Squamous Epithelial / LPF FEW (*)    Bacteria, UA FEW (*)    All other components within normal limits  GLUCOSE, CAPILLARY - Abnormal; Notable for the following:    Glucose-Capillary 231 (*)    All other components within normal limits  POCT I-STAT 3, BLOOD GAS (G3P V) - Abnormal; Notable for the following:    pH, Ven 7.353 (*)    pCO2, Ven 44.8 (*)    pO2, Ven 25.0 (*)    Bicarbonate 24.9 (*)    All other components within normal limits   No results found.   1. Hyperglycemia without ketosis   2. Seasonal allergies       MDM  -year-old female with hyperglycemia, and seasonal allergies.  Patient's glucose improved to the 200s after 2 L of fluid and NovoLog.  Patient advised to  start using Allegra and stop Claritin.  Will also add on Flonase for her hay fever symptoms.  Patient instructed to contact her primary care Dr. Today to let her know she was in the emergency department for hyperglycemia to see if she would like her to start Levemir.       Olivia Mackie, MD 12/05/12 [phone]

## 2012-12-06 ENCOUNTER — Encounter (HOSPITAL_COMMUNITY): Payer: Self-pay | Admitting: *Deleted

## 2012-12-06 DIAGNOSIS — J309 Allergic rhinitis, unspecified: Secondary | ICD-10-CM

## 2012-12-06 LAB — BASIC METABOLIC PANEL
BUN: 9 mg/dL (ref 6–23)
BUN: 9 mg/dL (ref 6–23)
BUN: 11 mg/dL (ref 6–23)
BUN: 13 mg/dL (ref 6–23)
CO2: 13 mEq/L — ABNORMAL LOW (ref 19–32)
CO2: 14 mEq/L — ABNORMAL LOW (ref 19–32)
CO2: 16 mEq/L — ABNORMAL LOW (ref 19–32)
Calcium: 8.6 mg/dL (ref 8.4–10.5)
Calcium: 8.6 mg/dL (ref 8.4–10.5)
Chloride: 103 mEq/L (ref 96–112)
Chloride: 106 mEq/L (ref 96–112)
Chloride: 97 mEq/L (ref 96–112)
Creatinine, Ser: 0.61 mg/dL (ref 0.50–1.10)
Creatinine, Ser: 0.68 mg/dL (ref 0.50–1.10)
Creatinine, Ser: 0.62 mg/dL (ref 0.50–1.10)
GFR calc non Af Amer: >90 mL/min (ref >90)
GFR calc non Af Amer: >90 mL/min (ref >90)
Glucose, Bld: 232 mg/dL — ABNORMAL HIGH (ref 70–99)
Glucose, Bld: 155 mg/dL — ABNORMAL HIGH (ref 70–99)
Glucose, Bld: 132 mg/dL — ABNORMAL HIGH (ref 70–99)
Glucose, Bld: 317 mg/dL — ABNORMAL HIGH (ref 70–99)
Potassium: 4.7 mEq/L (ref 3.5–5.1)
Potassium: 3.9 mEq/L (ref 3.5–5.1)
Sodium: 134 mEq/L — ABNORMAL LOW (ref 135–145)

## 2012-12-06 LAB — GLUCOSE, CAPILLARY
Glucose-Capillary: 266 mg/dL — ABNORMAL HIGH (ref 70–99)
Glucose-Capillary: 186 mg/dL — ABNORMAL HIGH (ref 70–99)
Glucose-Capillary: 159 mg/dL — ABNORMAL HIGH (ref 70–99)
Glucose-Capillary: 171 mg/dL — ABNORMAL HIGH (ref 70–99)

## 2012-12-06 LAB — BASIC METABOLIC PANEL WITH GFR
BUN: 10 mg/dL (ref 6–23)
BUN: 9 mg/dL (ref 6–23)
CO2: 12 meq/L — ABNORMAL LOW (ref 19–32)
CO2: 12 meq/L — ABNORMAL LOW (ref 19–32)
Calcium: 8.9 mg/dL (ref 8.4–10.5)
Calcium: 8.6 mg/dL (ref 8.4–10.5)
Chloride: 105 meq/L (ref 96–112)
Chloride: 106 meq/L (ref 96–112)
Creatinine, Ser: 0.7 mg/dL (ref 0.50–1.10)
Creatinine, Ser: 0.62 mg/dL (ref 0.50–1.10)
GFR calc Af Amer: >90 mL/min (ref >90)
GFR calc Af Amer: >90 mL/min (ref >90)
GFR calc non Af Amer: >90 mL/min (ref >90)
GFR calc non Af Amer: >90 mL/min (ref >90)
Glucose, Bld: 157 mg/dL — ABNORMAL HIGH (ref 70–99)
Glucose, Bld: 161 mg/dL — ABNORMAL HIGH (ref 70–99)
Potassium: 4.6 meq/L (ref 3.5–5.1)
Potassium: 4.7 meq/L (ref 3.5–5.1)
Sodium: 135 meq/L (ref 135–145)
Sodium: 132 meq/L — ABNORMAL LOW (ref 135–145)

## 2012-12-06 LAB — CBC
HCT: 38.6 % (ref 36.0–46.0)
Hemoglobin: 12.7 g/dL (ref 12.0–15.0)
MCH: 23.4 pg — ABNORMAL LOW (ref 26.0–34.0)
MCHC: 32.9 g/dL (ref 30.0–36.0)
MCV: 71.1 fL — ABNORMAL LOW (ref 78.0–100.0)
Platelets: 265 K/uL (ref 150–400)
RBC: 5.43 MIL/uL — ABNORMAL HIGH (ref 3.87–5.11)
RDW: 13.7 % (ref 11.5–15.5)
WBC: 13.9 K/uL — ABNORMAL HIGH (ref 4.0–10.5)

## 2012-12-06 MED ORDER — DEXTROSE 50 % IV SOLN: 25.0000 mL | INTRAVENOUS | Status: DC | PRN

## 2012-12-06 MED ORDER — SODIUM CHLORIDE 0.9 % IV SOLN
INTRAVENOUS | Status: DC
  Administered 2012-12-06: 01:00:00 via INTRAVENOUS

## 2012-12-06 MED ORDER — POTASSIUM CHLORIDE 10 MEQ/100ML IV SOLN
INTRAVENOUS | Status: AC
  Filled 2012-12-06: qty 200

## 2012-12-06 MED ORDER — INSULIN ASPART 100 UNIT/ML ~~LOC~~ SOLN
0.0000 [IU] | Freq: Every day | SUBCUTANEOUS | Status: DC
  Administered 2012-12-06: 3 [IU] via SUBCUTANEOUS

## 2012-12-06 MED ORDER — LORATADINE 10 MG PO TABS
10.0000 mg | ORAL_TABLET | Freq: Every day | ORAL | Status: DC
  Administered 2012-12-06: 10 mg via ORAL
  Filled 2012-12-06 (×2): qty 1

## 2012-12-06 MED ORDER — POTASSIUM CHLORIDE 10 MEQ/100ML IV SOLN
10.0000 meq | INTRAVENOUS | Status: AC
  Administered 2012-12-06 (×2): 10 meq via INTRAVENOUS

## 2012-12-06 MED ORDER — INSULIN ASPART 100 UNIT/ML ~~LOC~~ SOLN
0.0000 [IU] | Freq: Three times a day (TID) | SUBCUTANEOUS | Status: DC
  Administered 2012-12-06: 9 [IU] via SUBCUTANEOUS
  Administered 2012-12-07 (×2): 7 [IU] via SUBCUTANEOUS

## 2012-12-06 MED ORDER — FLUTICASONE PROPIONATE 50 MCG/ACT NA SUSP
1.0000 | Freq: Every day | NASAL | Status: DC
  Administered 2012-12-06: 1 via NASAL
  Filled 2012-12-06: qty 16

## 2012-12-06 MED ORDER — INSULIN GLARGINE 100 UNIT/ML ~~LOC~~ SOLN
10.0000 [IU] | Freq: Every day | SUBCUTANEOUS | Status: DC
  Administered 2012-12-06: 10 [IU] via SUBCUTANEOUS
  Filled 2012-12-06: qty 0.1

## 2012-12-06 MED ORDER — ENOXAPARIN SODIUM 40 MG/0.4ML ~~LOC~~ SOLN
40.0000 mg | SUBCUTANEOUS | Status: DC
  Filled 2012-12-06: qty 0.4

## 2012-12-06 MED ORDER — INSULIN GLARGINE 100 UNIT/ML ~~LOC~~ SOLN
30.0000 [IU] | Freq: Every day | SUBCUTANEOUS | Status: DC
  Administered 2012-12-06: 30 [IU] via SUBCUTANEOUS
  Filled 2012-12-06 (×2): qty 0.3

## 2012-12-06 MED ORDER — SODIUM CHLORIDE 0.9 % IV SOLN
INTRAVENOUS | Status: DC
  Administered 2012-12-06: 6.2 [IU]/h via INTRAVENOUS
  Filled 2012-12-06: qty 1

## 2012-12-06 MED ORDER — DEXTROSE-NACL 5-0.45 % IV SOLN
INTRAVENOUS | Status: DC
  Administered 2012-12-06: 02:00:00 via INTRAVENOUS

## 2012-12-06 MED ORDER — INSULIN ASPART 100 UNIT/ML ~~LOC~~ SOLN: 3.0000 [IU] | Freq: Three times a day (TID) | SUBCUTANEOUS | Status: DC

## 2012-12-06 MED ORDER — INSULIN ASPART 100 UNIT/ML ~~LOC~~ SOLN
6.0000 [IU] | Freq: Three times a day (TID) | SUBCUTANEOUS | Status: DC
  Administered 2012-12-07: 6 [IU] via SUBCUTANEOUS

## 2012-12-06 MED ORDER — PANTOPRAZOLE SODIUM 40 MG IV SOLR
40.0000 mg | INTRAVENOUS | Status: DC
  Administered 2012-12-06: 40 mg via INTRAVENOUS
  Filled 2012-12-06 (×2): qty 40

## 2012-12-06 NOTE — Progress Notes (Signed)
Inpatient Diabetes Program Recommendations  AACE/ADA: New Consensus Statement on Inpatient Glycemic Control (2013)  Target Ranges:  Prepandial:   less than 140 mg/dL      Peak postprandial:   less than 180 mg/dL (1-2 hours)      Critically ill patients:  140 - 180 mg/dL   Reason for Visit: Patient admitted with DKA.  She reports being sick for a week with congestion which she was taking antibiotic.  She reports that she was taking Humulin 70/30 30 units twice a day.  She states that she continued taking insulin however was unable to keep blood sugars down.  Patient reports that she used to see Dr. Everardo All however she has not seen him recently.  She states that she does not like taking 70/30 regimen and that she prefers Novolog/Lantus regimen.  She has most recently been managed by her PCP, Dr. Parke Simmers. She states that CBG's have been increased on 70/30 regimen.  If patient is to be switched to basal/bolus regimen, patient reports that she knows how to count carbohydrates.  Recommend outpatient follow-up with CDE at [facility] regarding more in depth education regarding diabetes management/CHO counting.  Called and discussed with Dr. Lavera Guise.  He will change her to basal/bolus regimen.  Will place referral for outpatient education per protocol.

## 2012-12-06 NOTE — Progress Notes (Signed)
CRITICAL VALUE ALERT  Critical value received:  CO2 10  Date of notification:  12/06/2012  Time of notification:  0232  Critical value read back:yes  Nurse who received alert:  B. Levin Bacon  MD notified (1st page):  Schorr  Time of first page:  0230  MD notified (2nd page):  Time of second page:  Responding MD: Schorr   Time MD responded:

## 2012-12-06 NOTE — Progress Notes (Signed)
Utilization review completed.  

## 2012-12-06 NOTE — Progress Notes (Signed)
Pt admitted into room 3301 with belongings, on insulin gtt, via stretcher. CBG is now 288. Pt does not c/o pain but guards her abdomen. VSS on RA. Significant other is at bedside. NP on call, CCMD, and ELINK notified. Will continue to monitor.

## 2012-12-06 NOTE — Progress Notes (Signed)
[facility] PROGRESS NOTE  Beverly Wells ZOX:[id number] DOB: Mar 26, 1991 DOA: 12/05/2012 PCP: Geraldo Pitter, MD  Assessment/Plan: 1. DKA - unclear why she went into DKA - reports compliance, insulin regimen has not changed, may have sinus disease but she doe not have severe bacterial sinusitis . Place don iv insulin and iv fluids from admission - monitor CBG every 1 hour and BMEt every 4 hours 2. DM type 1  3. Allergic rhinitis  Code Status: full Family Communication: patient  Disposition Plan: home   Consultants:    Procedures:    Antibiotics:  HPI/Subjective: Feels better   Objective: Filed Vitals:   12/06/12 0100 12/06/12 0200 12/06/12 0300 12/06/12 0400  BP:    119/52  Pulse:    95  Temp:    98.4 F (36.9 C)  TempSrc:    Oral  Resp: 22 23 18 24   Height:      Weight:      SpO2: 100% 100% 99% 100%   Patient Vitals for the past 24 hrs:  BP Temp Temp src Pulse Resp SpO2 Height Weight  12/06/12 0400 119/52 mmHg 98.4 F (36.9 C) Oral 95 24 100 % - -  12/06/12 0300 - - - - 18 99 % - -  12/06/12 0200 - - - - 23 100 % - -  12/06/12 0100 - - - - 22 100 % - -  12/06/12 0000 134/66 mmHg 98.6 F (37 C) Oral 102 20 99 % 5\' 7"  (1.702 m) 66.7 kg (147 lb 0.8 oz)  12/05/12 2250 130/58 mmHg - - 107 16 100 % - -  12/05/12 2156 130/52 mmHg - - 108 17 100 % - -  12/05/12 1841 143/70 mmHg - - 112 22 98 % - -     Intake/Output Summary (Last 24 hours) at 12/06/12 0842 Last data filed at 12/06/12 0500  Gross per 24 hour  Intake    350 ml  Output      0 ml  Net    350 ml   Filed Weights   12/06/12 0000  Weight: 66.7 kg (147 lb 0.8 oz)    Exam:   General:  axox3  Cardiovascular: rrr  Respiratory: ctab  Abdomen: soft, nt   Musculoskeletal: intact    Data Reviewed: Basic Metabolic Panel:  Recent Labs Lab 12/05/12 1916 12/05/12 1931 12/06/12 0110 12/06/12 0234 12/06/12 0427 12/06/12 0609  NA 128* 132* 134* 135 132* 130*  K 4.7 4.8 4.7 4.6 4.7  4.2  CL 94* 106 103 105 106 103  CO2 11*  --  10* 12* 12* 13*  GLUCOSE 448* 455* 232* 157* 161* 155*  BUN 11 11 11 10 9 9   CREATININE 0.65 0.60 0.68 0.70 0.62 0.61  CALCIUM 9.7  --  9.2 8.9 8.6 8.6   Liver Function Tests:  Recent Labs Lab 12/05/12 1916  AST 24  ALT 14  ALKPHOS 122*  BILITOT 0.3  PROT 8.0  ALBUMIN 3.9    Recent Labs Lab 12/05/12 1916  LIPASE 10*   No results found for this basename: AMMONIA,  in the last 168 hours CBC:  Recent Labs Lab 12/05/12 1916 12/05/12 1931 12/06/12 0016  WBC 10.0  --  13.9*  NEUTROABS 8.4*  --   --   HGB 13.2 15.3* 12.7  HCT 40.5 45.0 38.6  MCV 70.6*  --  71.1*  PLT 284  --  265   Cardiac Enzymes: No results found for this basename: CKTOTAL, CKMB, CKMBINDEX,  TROPONINI,  in the last 168 hours BNP (last 3 results) No results found for this basename: PROBNP,  in the last 8760 hours CBG:  Recent Labs Lab 12/06/12 0306 12/06/12 0421 12/06/12 0529 12/06/12 0635 12/06/12 0752  GLUCAP 159* 171* 161* 132* 155*    Recent Results (from the past 240 hour(s))  MRSA PCR SCREENING     Status: None   Collection Time    12/06/12 12:42 AM      Result Value Range Status   MRSA by PCR NEGATIVE  NEGATIVE Final   Comment:            The GeneXpert MRSA Assay (FDA     approved for NASAL specimens     only), is one component of a     comprehensive MRSA colonization     surveillance program. It is not     intended to diagnose MRSA     infection nor to guide or     monitor treatment for     MRSA infections.     Studies: Dg Chest Portable 1 View  12/05/2012  *RADIOLOGY REPORT*  Clinical Data: Shortness of breath and pain.  PORTABLE CHEST - 1 VIEW  Comparison: 06/03/2011 chest radiograph  Findings: The cardiomediastinal silhouette is unremarkable. The lungs are clear. There is no evidence of focal airspace disease, pulmonary edema, suspicious pulmonary nodule/mass, pleural effusion, or pneumothorax. No acute bony abnormalities  are identified.  IMPRESSION: No evidence of active cardiopulmonary disease.   Original Report Authenticated By: Harmon Pier, M.D.     Scheduled Meds: . [START ON 12/07/2012] enoxaparin (LOVENOX) injection  40 mg Subcutaneous Q24H  . gi cocktail  30 mL Oral Once  . insulin glargine  10 Units Subcutaneous Daily  . pantoprazole (PROTONIX) IV  40 mg Intravenous Q24H   Continuous Infusions: . sodium chloride 125 mL/hr at 12/06/12 0200  . dextrose 5 % and 0.45% NaCl 75 mL/hr at 12/06/12 0500  . insulin (NOVOLIN-R) infusion 1.4 Units/hr (12/06/12 4742)    Principal Problem:   DKA (diabetic ketoacidoses) Active Problems:   IDDM   ALLERGIC RHINITIS   URI (upper respiratory infection)   Intractable vomiting     Barb Shear  [facility] Pager [phone]. If 7PM-7AM, please contact night-coverage at [email or web address], password [facility] 12/06/2012, 8:42 AM  LOS: 1 day

## 2012-12-06 NOTE — Progress Notes (Signed)
Pt has had 4 consecutive CBGs within range. Schorr, NP notified. New orders received to administer 10 units of Lantus sub-q, continue insulin gtt for 1 hour, then call with CBG results. Will administer and continue to monitor.

## 2012-12-07 LAB — GLUCOSE, CAPILLARY: Glucose-Capillary: 303 mg/dL — ABNORMAL HIGH (ref 70–99)

## 2012-12-07 LAB — BASIC METABOLIC PANEL
CO2: 17 mEq/L — ABNORMAL LOW (ref 19–32)
Calcium: 8.8 mg/dL (ref 8.4–10.5)
Creatinine, Ser: 0.55 mg/dL (ref 0.50–1.10)
Glucose, Bld: 309 mg/dL — ABNORMAL HIGH (ref 70–99)
Sodium: 130 mEq/L — ABNORMAL LOW (ref 135–145)

## 2012-12-07 MED ORDER — INSULIN ASPART 100 UNIT/ML ~~LOC~~ SOLN
10.0000 [IU] | Freq: Three times a day (TID) | SUBCUTANEOUS | Status: DC
  Administered 2012-12-07 (×2): 10 [IU] via SUBCUTANEOUS

## 2012-12-07 MED ORDER — INSULIN GLARGINE 100 UNIT/ML ~~LOC~~ SOLN
40.0000 [IU] | Freq: Every day | SUBCUTANEOUS | Status: DC
  Filled 2012-12-07: qty 0.4

## 2012-12-07 NOTE — Progress Notes (Signed)
[facility] PROGRESS NOTE  Beverly Wells WUJ:[id number] DOB: 15-Feb-1991 DOA: 12/05/2012 PCP: Geraldo Pitter, MD  Assessment/Plan: 1. DKA - unclear why she went into DKA - reports compliance, insulin regimen has not changed, may have sinus disease but she doe not have severe bacterial sinusitis . Place don iv insulin and iv fluids from admission - monitor CBG every 1 hour and BMEt every 4 hours. DKA difficult to resolve and she remained acidotic without hyperchloremia up to 4/24. Not very symptomatic. 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  2. DM type 1 - regimen of 70/30 inadequate to keep patient out of DKA. Started Lantus 30 units QHS on 4/23 and novolog 6 units TID - increased Lantus to 40 units qhs and 10 units novolog TID on 4/24 3. Allergic rhinitis  Code Status: full Family Communication: patient  Disposition Plan: home   Consultants:    Procedures:    Antibiotics:  HPI/Subjective: Feels better   Objective: Filed Vitals:   12/06/12 2000 12/07/12 0000 12/07/12 0400 12/07/12 0748  BP: 111/54 93/40 119/54 92/54  Pulse: 100 96 100   Temp: 98.6 F (37 C) 98.6 F (37 C) 98.5 F (36.9 C) 98.4 F (36.9 C)  TempSrc: Oral Oral Oral Oral  Resp: 18 23 24    Height:      Weight:      SpO2: 100% 100% 98%    Patient Vitals for the past 24 hrs:  BP Temp Temp src Pulse Resp SpO2  12/07/12 0748 92/54 mmHg 98.4 F (36.9 C) Oral - - -  12/07/12 0400 119/54 mmHg 98.5 F (36.9 C) Oral 100 24 98 %  12/07/12 0000 93/40 mmHg 98.6 F (37 C) Oral 96 23 100 %  12/06/12 2000 111/54 mmHg 98.6 F (37 C) Oral 100 18 100 %  12/06/12 1531 102/55 mmHg 98.7 F (37.1 C) Oral 98 13 99 %  12/06/12 1118 100/30 mmHg 98.1 F (36.7 C) Oral 94 19 100 %  12/06/12 0843 107/36 mmHg 98.2 F (36.8 C) Oral 91 21 100 %     Intake/Output Summary (Last 24 hours) at 12/07/12 0825 Last data filed at 12/07/12 0748  Gross per 24 hour  Intake   1245 ml  Output    701 ml  Net    544 ml   Filed Weights    12/06/12 0000  Weight: 66.7 kg (147 lb 0.8 oz)    Exam:   General:  axox3  Cardiovascular: rrr  Respiratory: ctab  Abdomen: soft, nt   Musculoskeletal: intact    Data Reviewed: Basic Metabolic Panel:  Recent Labs Lab 12/06/12 0609 12/06/12 0941 12/06/12 1520 12/06/12 2154 12/07/12 0420  NA 130* 133* 129* 130* 130*  K 4.2 3.9 4.0 3.8 3.8  CL 103 106 97 99 98  CO2 13* 15* 14* 16* 17*  GLUCOSE 155* 132* 369* 317* 309*  BUN 9 9 11 13 9   CREATININE 0.61 0.58 0.68 0.62 0.55  CALCIUM 8.6 8.7 8.8 8.6 8.8   Liver Function Tests:  Recent Labs Lab 12/05/12 1916  AST 24  ALT 14  ALKPHOS 122*  BILITOT 0.3  PROT 8.0  ALBUMIN 3.9    Recent Labs Lab 12/05/12 1916  LIPASE 10*   No results found for this basename: AMMONIA,  in the last 168 hours CBC:  Recent Labs Lab 12/05/12 1916 12/05/12 1931 12/06/12 0016  WBC 10.0  --  13.9*  NEUTROABS 8.4*  --   --   HGB 13.2 15.3* 12.7  HCT 40.5 45.0 38.6  MCV 70.6*  --  71.1*  PLT 284  --  265   Cardiac Enzymes: No results found for this basename: CKTOTAL, CKMB, CKMBINDEX, TROPONINI,  in the last 168 hours BNP (last 3 results) No results found for this basename: PROBNP,  in the last 8760 hours CBG:  Recent Labs Lab 12/06/12 1116 12/06/12 1225 12/06/12 1638 12/06/12 2127 12/07/12 0745  GLUCAP 159* 173* 361* 294* 302*    Recent Results (from the past 240 hour(s))  MRSA PCR SCREENING     Status: None   Collection Time    12/06/12 12:42 AM      Result Value Range Status   MRSA by PCR NEGATIVE  NEGATIVE Final   Comment:            The GeneXpert MRSA Assay (FDA     approved for NASAL specimens     only), is one component of a     comprehensive MRSA colonization     surveillance program. It is not     intended to diagnose MRSA     infection nor to guide or     monitor treatment for     MRSA infections.     Studies: Dg Chest Portable 1 View  12/05/2012  *RADIOLOGY REPORT*  Clinical Data:  Shortness of breath and pain.  PORTABLE CHEST - 1 VIEW  Comparison: 06/03/2011 chest radiograph  Findings: The cardiomediastinal silhouette is unremarkable. The lungs are clear. There is no evidence of focal airspace disease, pulmonary edema, suspicious pulmonary nodule/mass, pleural effusion, or pneumothorax. No acute bony abnormalities are identified.  IMPRESSION: No evidence of active cardiopulmonary disease.   Original Report Authenticated By: Harmon Pier, M.D.     Scheduled Meds: . enoxaparin (LOVENOX) injection  40 mg Subcutaneous Q24H  . fluticasone  1 spray Each Nare Daily  . insulin aspart  0-5 Units Subcutaneous QHS  . insulin aspart  0-9 Units Subcutaneous TID WC  . insulin aspart  10 Units Subcutaneous TID WC  . insulin glargine  40 Units Subcutaneous QHS  . loratadine  10 mg Oral Daily   Continuous Infusions:    Principal Problem:   DKA (diabetic ketoacidoses) Active Problems:   IDDM   ALLERGIC RHINITIS   URI (upper respiratory infection)   Intractable vomiting     Beverly Wells  [facility] Pager [phone]. If 7PM-7AM, please contact night-coverage at [email or web address], password [facility] 12/07/2012, 8:25 AM  LOS: 2 days

## 2012-12-07 NOTE — Progress Notes (Signed)
While obtaining VS on pt  I noted a 4 x 4 dressing to pt's LAC where her PIV had been.  Pt stated "It was bothering me so I pulled it out".  Refuses IV restart.  Taking PO's well.  Tama Gander, PA notified of same.

## 2012-12-07 NOTE — Progress Notes (Signed)
Inpatient Diabetes Program Recommendations  AACE/ADA: New Consensus Statement on Inpatient Glycemic Control (2013)  Target Ranges:  Prepandial:   less than 140 mg/dL      Peak postprandial:   less than 180 mg/dL (1-2 hours)      Critically ill patients:  140 - 180 mg/dL   Reason for Visit: Patient was changed to basal/bolus regimen.  She plans to follow-up with Dr. Lafe Garin.  Also placed referral for outpatient diabetes education follow-up.   CBG much lower at lunch.  Patient admits that she did not eat much breakfast but ate more lunch.  Would benefit from [facility] ratio/Correction factor as an outpatient to improve control and flexibility.

## 2012-12-08 MED ORDER — INSULIN ASPART 100 UNIT/ML FLEXPEN: SUBCUTANEOUS | Status: DC

## 2012-12-08 MED ORDER — INSULIN GLARGINE 100 UNITS/ML SOLOSTAR PEN: 40.0000 [IU] | PEN_INJECTOR | Freq: Every day | SUBCUTANEOUS | Status: DC

## 2012-12-08 NOTE — Progress Notes (Signed)
Patient called back the discharging unit 5500. She did not give a reason for leaving AMA. She reported feeling fine.  I have called the prescription for her  lantus 40 units at bedtime And Novolog 1 unit per 10 grams of carbs orescription at [facility] on [address]. I have told the patient to call Dr. Parke Simmers and follow with her first. If patient comes to folllow up visit she could be referred to new endocrinologist Dr. Carlus Pavlov [phone] Lonia Blood

## 2012-12-08 NOTE — Progress Notes (Signed)
Pt arrived to unit from 3300 at 7:15 during shift change. The nurse tech from 3300 indicated the patient was dressed and was ready to leave AMA. Pt was very upset, cursing at the staff, and walking towards the elevator. Several nurses asked the patient to sign AMA form but patient repeatedly refused. Artist Beach, NP/PA with [facility] was text paged and notified of situation but the patient got on the elevator and left the unit before the provider had time To respond to page. No AMA form was signed. Driggers, Armando Reichert

## 2012-12-08 NOTE — Discharge Summary (Signed)
Physician Discharge Summary  Beverly Wells AVW:[id number] DOB: 09/16/1990 DOA: 12/05/2012  PCP: Beverly Pitter, MD  Admit date: 12/05/2012 Discharge date: 12/08/2012  Recommendations for Outpatient Follow-up:  1. If patient is found we would recommend that she starts Lantus 40 units at bedtime and NovoLog 12 units with meals   Discharge Diagnoses:   DKA (diabetic ketoacidoses) - resolving   IDDM   ALLERGIC RHINITIS   URI (upper respiratory infection)   Discharge Condition: Patient disappeared from unit 5500 around 5 AM on April 25  Diet recommendation: Unable to give  [employer]   12/06/12 0000  Weight: 66.7 kg (147 lb 0.8 oz)    History of present illness:  22 yo type 1 female diabetic comes in with over 24 hours of uri symptoms, nasal congestion came to ED last night was not in dka but then today started n/v several times and not able to eat. No fevers. No cough. No dysuria. h aving epigastric abd pain. Sugars have been over 400.      Hospital Course:  1. DKA - unclear why she went into DKA - reports compliance, insulin regimen has not changed, may have sinus disease but she doe not have severe bacterial sinusitis . Place don iv insulin and iv fluids from admission - monitor CBG every 1 hour and BMEt every 4 hours. DKA difficult to resolve and she remained acidotic without hyperchloremia up to 4/24. Not very symptomatic. 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 Another basic metabolic profile was planned for the morning of April 25 - unfortunately the patient left the building before the study can be undertaken.  2. DM type 1 - regimen of 70/30 inadequate to keep patient out of DKA. Started Lantus 30 units QHS on 4/23 and novolog 6 units TID - increased Lantus to 40 units qhs and 10 units novolog TID on 4/24. Unfortunately the patient left the hospital before we could finalize the outpatient treatment plan.  3. Allergic rhinitis  On the morning of April 25 when I arrived to the hospital I  cannot find the patient on the active census. When I called to unit 5500 the charge nurse informed me that the patient left AGAINST MEDICAL ADVICE around 7 PM yesterday. No one has informed me yesterday about the patient leaving AGAINST MEDICAL ADVICE.  I have called the patient's home number listed in epic [phone] but no one answered the phone. I then proceeded to call the patient's aunt Beverly Wells on her cell phone at [phone]. The aunt  informed me that she has no idea where the patient might be but that she will try hard to locate the patient. I have informed the end of that I would like the patient to call me back so we can discuss the treatment plan.   Discharge Exam: Could not be performed as the patient left AGAINST MEDICAL ADVICE  Discharge Instructions  Discharge Orders   Future Orders Complete By Expires     Ambulatory referral to Nutrition and Diabetic Education  As directed     Scheduling Instructions:      Patient has type 1 diabetes.  Will need 1:1      No final list of medication could be performed as the patient left AGAINST MEDICAL ADVICE    The results of significant diagnostics from this hospitalization (including imaging, microbiology, ancillary and laboratory) are listed below for reference.    Significant Diagnostic Studies: Dg Chest Portable 1 View  12/05/2012  *RADIOLOGY REPORT*  Clinical Data: Shortness of breath and pain.  PORTABLE CHEST - 1 VIEW  Comparison: 06/03/2011 chest radiograph  Findings: The cardiomediastinal silhouette is unremarkable. The lungs are clear. There is no evidence of focal airspace disease, pulmonary edema, suspicious pulmonary nodule/mass, pleural effusion, or pneumothorax. No acute bony abnormalities are identified.  IMPRESSION: No evidence of active cardiopulmonary disease.   Original Report Authenticated By: Beverly Wells, M.D.     Microbiology: Recent Results (from the past 240 hour(s))  MRSA PCR SCREENING     Status: None    Collection Time    12/06/12 12:42 AM      Result Value Range Status   MRSA by PCR NEGATIVE  NEGATIVE Final   Comment:            The GeneXpert MRSA Assay (FDA     approved for NASAL specimens     only), is one component of a     comprehensive MRSA colonization     surveillance program. It is not     intended to diagnose MRSA     infection nor to guide or     monitor treatment for     MRSA infections.     Labs: Basic Metabolic Panel:  Recent Labs Lab 12/06/12 0609 12/06/12 0941 12/06/12 1520 12/06/12 2154 12/07/12 0420  NA 130* 133* 129* 130* 130*  K 4.2 3.9 4.0 3.8 3.8  CL 103 106 97 99 98  CO2 13* 15* 14* 16* 17*  GLUCOSE 155* 132* 369* 317* 309*  BUN 9 9 11 13 9   CREATININE 0.61 0.58 0.68 0.62 0.55  CALCIUM 8.6 8.7 8.8 8.6 8.8   Liver Function Tests:  Recent Labs Lab 12/05/12 1916  AST 24  ALT 14  ALKPHOS 122*  BILITOT 0.3  PROT 8.0  ALBUMIN 3.9    Recent Labs Lab 12/05/12 1916  LIPASE 10*   No results found for this basename: AMMONIA,  in the last 168 hours CBC:  Recent Labs Lab 12/05/12 1916 12/05/12 1931 12/06/12 0016  WBC 10.0  --  13.9*  NEUTROABS 8.4*  --   --   HGB 13.2 15.3* 12.7  HCT 40.5 45.0 38.6  MCV 70.6*  --  71.1*  PLT 284  --  265   Cardiac Enzymes: No results found for this basename: CKTOTAL, CKMB, CKMBINDEX, TROPONINI,  in the last 168 hours BNP: BNP (last 3 results) No results found for this basename: PROBNP,  in the last 8760 hours CBG:  Recent Labs Lab 12/06/12 1638 12/06/12 2127 12/07/12 0745 12/07/12 1209 12/07/12 1719  GLUCAP 361* 294* 302* 104* 303*       Signed:  Joana Wells  [facility] 12/08/2012, 8:28 AM

## 2013-01-13 ENCOUNTER — Encounter (HOSPITAL_BASED_OUTPATIENT_CLINIC_OR_DEPARTMENT_OTHER): Payer: Self-pay

## 2013-01-13 ENCOUNTER — Emergency Department (HOSPITAL_BASED_OUTPATIENT_CLINIC_OR_DEPARTMENT_OTHER)
Admission: EM | Admit: 2013-01-13 | Discharge: 2013-01-13 | Disposition: A | Discharge status: Home / Self Care | Payer: Medicaid Other | Attending: Emergency Medicine | Admitting: Emergency Medicine

## 2013-01-13 DIAGNOSIS — Z794 Long term (current) use of insulin: Secondary | ICD-10-CM | POA: Insufficient documentation

## 2013-01-13 DIAGNOSIS — Z79899 Other long term (current) drug therapy: Secondary | ICD-10-CM | POA: Insufficient documentation

## 2013-01-13 DIAGNOSIS — M7989 Other specified soft tissue disorders: Secondary | ICD-10-CM | POA: Insufficient documentation

## 2013-01-13 DIAGNOSIS — M25579 Pain in unspecified ankle and joints of unspecified foot: Secondary | ICD-10-CM | POA: Insufficient documentation

## 2013-01-13 DIAGNOSIS — Z9119 Patient's noncompliance with other medical treatment and regimen: Secondary | ICD-10-CM | POA: Insufficient documentation

## 2013-01-13 DIAGNOSIS — E1169 Type 2 diabetes mellitus with other specified complication: Secondary | ICD-10-CM | POA: Insufficient documentation

## 2013-01-13 DIAGNOSIS — R739 Hyperglycemia, unspecified: Secondary | ICD-10-CM

## 2013-01-13 DIAGNOSIS — Z91199 Patient's noncompliance with other medical treatment and regimen due to unspecified reason: Secondary | ICD-10-CM | POA: Insufficient documentation

## 2013-01-13 DIAGNOSIS — Z3202 Encounter for pregnancy test, result negative: Secondary | ICD-10-CM | POA: Insufficient documentation

## 2013-01-13 DIAGNOSIS — Z880 Allergy status to penicillin: Secondary | ICD-10-CM | POA: Insufficient documentation

## 2013-01-13 DIAGNOSIS — M79671 Pain in right foot: Secondary | ICD-10-CM

## 2013-01-13 LAB — URINALYSIS, ROUTINE W REFLEX MICROSCOPIC
Bilirubin Urine: NEGATIVE
Hgb urine dipstick: NEGATIVE
Ketones, ur: 15 mg/dL — AB
Nitrite: NEGATIVE
Protein, ur: NEGATIVE mg/dL
Specific Gravity, Urine: 1.037 — ABNORMAL HIGH (ref 1.005–1.030)
Urobilinogen, UA: 0.2 mg/dL (ref 0.0–1.0)

## 2013-01-13 LAB — COMPREHENSIVE METABOLIC PANEL
Albumin: 3.1 g/dL — ABNORMAL LOW (ref 3.5–5.2)
BUN: 10 mg/dL (ref 6–23)
Calcium: 8.9 mg/dL (ref 8.4–10.5)
Chloride: 99 mEq/L (ref 96–112)
Creatinine, Ser: 0.6 mg/dL (ref 0.50–1.10)
GFR calc non Af Amer: >90 mL/min (ref >90)
Total Bilirubin: 0.3 mg/dL (ref 0.3–1.2)

## 2013-01-13 LAB — CBC WITH DIFFERENTIAL/PLATELET
Basophils Absolute: 0 10*3/uL (ref 0.0–0.1)
Basophils Relative: 0 % (ref 0–1)
Eosinophils Absolute: 0.1 10*3/uL (ref 0.0–0.7)
Eosinophils Relative: 2 % (ref 0–5)
HCT: 31.8 % — ABNORMAL LOW (ref 36.0–46.0)
Hemoglobin: 10.2 g/dL — ABNORMAL LOW (ref 12.0–15.0)
MCH: 23.4 pg — ABNORMAL LOW (ref 26.0–34.0)
MCHC: 32.1 g/dL (ref 30.0–36.0)
MCV: 72.9 fL — ABNORMAL LOW (ref 78.0–100.0)
Monocytes Absolute: 0.3 10*3/uL (ref 0.1–1.0)
Monocytes Relative: 4 % (ref 3–12)
Neutro Abs: 4 10*3/uL (ref 1.7–7.7)
RDW: 13.7 % (ref 11.5–15.5)

## 2013-01-13 LAB — URINE MICROSCOPIC-ADD ON

## 2013-01-13 LAB — GLUCOSE, CAPILLARY

## 2013-01-13 MED ORDER — INSULIN ASPART 100 UNIT/ML ~~LOC~~ SOLN
8.0000 [IU] | Freq: Once | SUBCUTANEOUS | Status: AC
  Administered 2013-01-13: 8 [IU] via INTRAVENOUS
  Filled 2013-01-13: qty 0.08

## 2013-01-13 MED ORDER — SODIUM CHLORIDE 0.9 % IV BOLUS (SEPSIS): 1000.0000 mL | Freq: Once | INTRAVENOUS | Status: DC

## 2013-01-13 NOTE — ED Notes (Signed)
Upon entering patient's room to be D/C, patient eating [employer].

## 2013-01-13 NOTE — ED Provider Notes (Signed)
History  This chart was scribed for Geoffery Lyons, MD by Ardelia Mems, ED Scribe. This patient was seen in room MH09/MH09 and the patient's care was started at 5:27 PM.   CSN: [id number]  Arrival date & time 01/13/13  1611     Chief Complaint  Patient presents with  . Foot Pain    The history is provided by the patient. No language interpreter was used.    HPI Comments: Beverly Wells is a 22 y.o. female with h/o IDDM who presents to the Emergency Department complaining of constant, moderate bilateral foot pain that onset earlier today. There is associated swelling of the feet bilaterally. Pt states that her feet began hurting 2 days ago and became swollen as of last night. Pt describes pain as "pins and needles". Pt has a home arrest ankle device on that she states is not causing the pain. Pt states that she has been taking her insulin. Pt states that her at home CBG"s have been >300, but that they are normally in the 100's. Pt denies fever, emesis, diarrhea, dysuria, abdominal pain, back pain  Past Medical History  Diagnosis Date  . 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 ALLERGIC RHINITIS 05/07/2010  . DKA (diabetic ketoacidoses)     Past Surgical History  Procedure Laterality Date  . Tonsillectomy      Family History  Problem Relation Age of Onset  . Diabetes Neg Hx     History  Substance Use Topics  . Smoking status: Never Smoker   . Smokeless tobacco: Never Used  . Alcohol Use: No    OB History   Grav Para Term Preterm Abortions TAB SAB Ect Mult Living                  Review of Systems  Constitutional: Negative for fever.  Gastrointestinal: Negative for nausea, vomiting and diarrhea.  Genitourinary: Negative for dysuria.  Musculoskeletal: Negative for back pain.  All other systems reviewed and are negative.    Allergies  Banana; Orange concentrate; and Penicillins  Home Medications   Current Outpatient Rx   Name  Route  Sig  Dispense  Refill  . cetirizine (ZYRTEC) 10 MG tablet   Oral   Take 10 mg by mouth daily.           . insulin aspart (NOVOLOG) 100 unit/ml SOLN      1 unit per 10 grams of carbs with each meal   3 mL   0   . insulin glargine (LANTUS) 100 units/mL SOLN   Subcutaneous   Inject 40 Units into the skin daily at 10 pm.   3 mL   0     Triage Vitals : BP 112/62  Pulse 101  Temp(Src) 98.4 F (36.9 C) (Oral)  Resp 20  Ht 5\' 7"  (1.702 m)  Wt 145 lb (65.772 kg)  BMI 22.71 kg/m2  SpO2 97%  LMP 01/02/2013  Physical Exam  Nursing note and vitals reviewed. Constitutional: She is oriented to person, place, and time. She appears well-developed and well-nourished. No distress.  HENT:  Head: Normocephalic and atraumatic.  Mouth/Throat: Oropharynx is clear and moist.  Eyes: EOM are normal. Pupils are equal, round, and reactive to light.  Neck: Normal range of motion. No tracheal deviation present.  Cardiovascular: Normal rate, regular rhythm and normal heart sounds.   No murmur heard. Pulmonary/Chest: Effort normal and breath sounds normal. No respiratory distress.  Abdominal:  Soft. Bowel sounds are normal. There is no tenderness.  Musculoskeletal: Normal range of motion. She exhibits no tenderness.  No swelling or edema of distal extremities. DPs, motor and sensory intact.  Neurological: She is alert and oriented to person, place, and time.  Skin: Skin is warm. No rash noted.  Psychiatric: She has a normal mood and affect.    ED Course  Procedures (including critical care time)  DIAGNOSTIC STUDIES: Oxygen Saturation is 97% on RA, normal by my interpretation.    COORDINATION OF CARE: 5:31 PM- Pt advised of plan for treatment and pt agrees.     Labs Reviewed  GLUCOSE, CAPILLARY - Abnormal; Notable for the following:    Glucose-Capillary 437 (*)    All other components within normal limits  CBC WITH DIFFERENTIAL - Abnormal; Notable for the following:     Hemoglobin 10.2 (*)    HCT 31.8 (*)    MCV 72.9 (*)    MCH 23.4 (*)    All other components within normal limits  URINALYSIS, ROUTINE W REFLEX MICROSCOPIC  PREGNANCY, URINE  COMPREHENSIVE METABOLIC PANEL   No results found.   No diagnosis found.    MDM  The patient presents with complaints of elevated blood sugar and feet that are painful.  He initial sugar was 500.  She was given ivf and iv insulin along with fluids.  Her sugars ultimately improved into the 200's.  The workup does not reveal dka.  I have advised her to see her pcp to discuss tighter control of her sugars.  I suspect that she is not the most compliant diabetic, however she was advised of the complications that can occur as a result of not managing her diabetes.           I personally performed the services described in this documentation, which was scribed in my presence. The recorded information has been reviewed and is accurate.      Geoffery Lyons, MD 01/14/13 [phone]

## 2013-01-13 NOTE — ED Notes (Signed)
Pt's CBG 269. Nettie Elm, RN informed of result.

## 2013-01-13 NOTE — ED Notes (Signed)
Pt states that she has diabetes and is having severe foot pain bilaterally, described as pins and needles.  Pt states that her at home CBG"s have been > 300, but that they are normally in the 100's.

## 2013-02-18 ENCOUNTER — Emergency Department (HOSPITAL_COMMUNITY)
Admission: EM | Admit: 2013-02-18 | Discharge: 2013-02-18 | Disposition: A | Discharge status: Home / Self Care | Payer: Medicaid Other | Attending: Emergency Medicine | Admitting: Emergency Medicine

## 2013-02-18 ENCOUNTER — Encounter (HOSPITAL_COMMUNITY): Payer: Self-pay | Admitting: Emergency Medicine

## 2013-02-18 DIAGNOSIS — R350 Frequency of micturition: Secondary | ICD-10-CM | POA: Insufficient documentation

## 2013-02-18 DIAGNOSIS — Z9119 Patient's noncompliance with other medical treatment and regimen: Secondary | ICD-10-CM | POA: Insufficient documentation

## 2013-02-18 DIAGNOSIS — E1169 Type 2 diabetes mellitus with other specified complication: Secondary | ICD-10-CM | POA: Insufficient documentation

## 2013-02-18 DIAGNOSIS — Z794 Long term (current) use of insulin: Secondary | ICD-10-CM | POA: Insufficient documentation

## 2013-02-18 DIAGNOSIS — Z91199 Patient's noncompliance with other medical treatment and regimen due to unspecified reason: Secondary | ICD-10-CM | POA: Insufficient documentation

## 2013-02-18 DIAGNOSIS — Z880 Allergy status to penicillin: Secondary | ICD-10-CM | POA: Insufficient documentation

## 2013-02-18 DIAGNOSIS — Z3202 Encounter for pregnancy test, result negative: Secondary | ICD-10-CM | POA: Insufficient documentation

## 2013-02-18 DIAGNOSIS — R739 Hyperglycemia, unspecified: Secondary | ICD-10-CM

## 2013-02-18 LAB — URINE MICROSCOPIC-ADD ON

## 2013-02-18 LAB — CBC WITH DIFFERENTIAL/PLATELET
Eosinophils Relative: 1 % (ref 0–5)
HCT: 38.1 % (ref 36.0–46.0)
Lymphocytes Relative: 24 % (ref 12–46)
Lymphs Abs: 1.6 10*3/uL (ref 0.7–4.0)
MCH: 22.6 pg — ABNORMAL LOW (ref 26.0–34.0)
MCV: 70.7 fL — ABNORMAL LOW (ref 78.0–100.0)
Monocytes Absolute: 0.3 10*3/uL (ref 0.1–1.0)
RBC: 5.39 MIL/uL — ABNORMAL HIGH (ref 3.87–5.11)
WBC: 6.7 10*3/uL (ref 4.0–10.5)

## 2013-02-18 LAB — URINALYSIS, ROUTINE W REFLEX MICROSCOPIC
Bilirubin Urine: NEGATIVE
Glucose, UA: >1000 mg/dL — AB
Hgb urine dipstick: NEGATIVE
Ketones, ur: 40 mg/dL — AB
pH: 5.5 (ref 5.0–8.0)

## 2013-02-18 LAB — BASIC METABOLIC PANEL
Chloride: 98 mEq/L (ref 96–112)
Creatinine, Ser: 0.59 mg/dL (ref 0.50–1.10)
GFR calc Af Amer: >90 mL/min (ref >90)
GFR calc non Af Amer: >90 mL/min (ref >90)

## 2013-02-18 MED ORDER — INSULIN GLARGINE 100 UNIT/ML ~~LOC~~ SOLN: 30.0000 [IU] | Freq: Every day | SUBCUTANEOUS | Status: DC

## 2013-02-18 MED ORDER — INSULIN ASPART 100 UNIT/ML ~~LOC~~ SOLN: 1.0000 [IU] | Freq: Three times a day (TID) | SUBCUTANEOUS | Status: DC

## 2013-02-18 MED ORDER — SODIUM CHLORIDE 0.9 % IV BOLUS (SEPSIS)
1000.0000 mL | Freq: Once | INTRAVENOUS | Status: AC
  Administered 2013-02-18: 1000 mL via INTRAVENOUS

## 2013-02-18 MED ORDER — INSULIN ASPART 100 UNIT/ML ~~LOC~~ SOLN
8.0000 [IU] | Freq: Once | SUBCUTANEOUS | Status: AC
  Administered 2013-02-18: 8 [IU] via SUBCUTANEOUS
  Filled 2013-02-18: qty 1

## 2013-02-18 NOTE — ED Provider Notes (Signed)
History    CSN: [id number] Arrival date & time 02/18/13  0151  First MD Initiated Contact with Patient 02/18/13 0216     Chief Complaint  Patient presents with  . Hyperglycemia   HPI  History provided by the patient. Patient is a 22 year old female with history of diabetes presenting with concerns for elevated blood sugar. Patient states that she has not used her Lantus for the past 3 nights. She has also not used her regular insulin regularly as instructed. She arrives tonight with concerns for elevated blood sugar. Patient is also a police custody with plans to be taken to jail. Patient denies any other symptoms. She denies any fever, chills or sweats. No abdominal pain. No nausea or vomiting. No shortness of breath or chest discomforts. She does report some urinary frequency but no dysuria hematuria or flank pain. No vaginal bleeding or discharge.    Past Medical History  Diagnosis Date  . 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 ALLERGIC RHINITIS 05/07/2010  . DKA (diabetic ketoacidoses)    Past Surgical History  Procedure Laterality Date  . Tonsillectomy     Family History  Problem Relation Age of Onset  . Diabetes Neg Hx    History  Substance Use Topics  . Smoking status: Never Smoker   . Smokeless tobacco: Never Used  . Alcohol Use: No   OB History   Grav Para Term Preterm Abortions TAB SAB Ect Mult Living                 Review of Systems  Constitutional: Negative for fever and chills.  Respiratory: Negative for shortness of breath.   Cardiovascular: Negative for chest pain.  Gastrointestinal: Negative for nausea, vomiting and abdominal pain.  Genitourinary: Positive for frequency. Negative for dysuria, flank pain, vaginal bleeding, vaginal discharge and menstrual problem.  All other systems reviewed and are negative.    Allergies  Banana; Orange concentrate; and Penicillins  Home Medications   Current Outpatient  Rx  Name  Route  Sig  Dispense  Refill  . cetirizine (ZYRTEC) 10 MG tablet   Oral   Take 10 mg by mouth daily.           . insulin aspart (NOVOLOG) 100 unit/ml SOLN      1 unit per 10 grams of carbs with each meal   3 mL   0   . insulin glargine (LANTUS) 100 units/mL SOLN   Subcutaneous   Inject 40 Units into the skin daily at 10 pm.   3 mL   0    BP 145/71  Pulse 84  Temp(Src) 98 F (36.7 C) (Oral)  Resp 18  SpO2 97% Physical Exam  Nursing note and vitals reviewed. Constitutional: She is oriented to person, place, and time. She appears well-developed and well-nourished. No distress.  HENT:  Head: Normocephalic.  Cardiovascular: Normal rate and regular rhythm.   Pulmonary/Chest: Effort normal and breath sounds normal.  Abdominal: Soft. There is no tenderness. There is no rebound and no guarding.  Musculoskeletal: She exhibits no edema.  Neurological: She is alert and oriented to person, place, and time.  Skin: Skin is warm and dry. No rash noted.  Psychiatric: She has a normal mood and affect. Her behavior is normal.    ED Course  Procedures   Results for orders placed during the hospital encounter of 02/18/13  GLUCOSE, CAPILLARY      Result Value Range  Glucose-Capillary 411 (*) 70 - 99 mg/dL  CBC WITH DIFFERENTIAL      Result Value Range   WBC 6.7  4.0 - 10.5 K/uL   RBC 5.39 (*) 3.87 - 5.11 MIL/uL   Hemoglobin 12.2  12.0 - 15.0 g/dL   HCT 40.9  81.1 - 91.4 %   MCV 70.7 (*) 78.0 - 100.0 fL   MCH 22.6 (*) 26.0 - 34.0 pg   MCHC 32.0  30.0 - 36.0 g/dL   RDW 78.2  95.6 - 21.3 %   Platelets 272  150 - 400 K/uL   Neutrophils Relative % 71  43 - 77 %   Neutro Abs 4.7  1.7 - 7.7 K/uL   Lymphocytes Relative 24  12 - 46 %   Lymphs Abs 1.6  0.7 - 4.0 K/uL   Monocytes Relative 5  3 - 12 %   Monocytes Absolute 0.3  0.1 - 1.0 K/uL   Eosinophils Relative 1  0 - 5 %   Eosinophils Absolute 0.0  0.0 - 0.7 K/uL   Basophils Relative 0  0 - 1 %   Basophils Absolute  0.0  0.0 - 0.1 K/uL  URINALYSIS, ROUTINE W REFLEX MICROSCOPIC      Result Value Range   Color, Urine YELLOW  YELLOW   APPearance CLEAR  CLEAR   Specific Gravity, Urine 1.015  1.005 - 1.030   pH 5.5  5.0 - 8.0   Glucose, UA >1000 (*) NEGATIVE mg/dL   Hgb urine dipstick NEGATIVE  NEGATIVE   Bilirubin Urine NEGATIVE  NEGATIVE   Ketones, ur 40 (*) NEGATIVE mg/dL   Protein, ur NEGATIVE  NEGATIVE mg/dL   Urobilinogen, UA 0.2  0.0 - 1.0 mg/dL   Nitrite NEGATIVE  NEGATIVE   Leukocytes, UA NEGATIVE  NEGATIVE  BASIC METABOLIC PANEL      Result Value Range   Sodium 133 (*) 135 - 145 mEq/L   Potassium 4.5  3.5 - 5.1 mEq/L   Chloride 98  96 - 112 mEq/L   CO2 26  19 - 32 mEq/L   Glucose, Bld 412 (*) 70 - 99 mg/dL   BUN 13  6 - 23 mg/dL   Creatinine, Ser 0.86  0.50 - 1.10 mg/dL   Calcium 9.9  8.4 - 57.8 mg/dL   GFR calc non Af Amer >90  >90 mL/min   GFR calc Af Amer >90  >90 mL/min  URINE MICROSCOPIC-ADD ON      Result Value Range   Squamous Epithelial / LPF RARE  RARE   WBC, UA 0-2  <3 WBC/hpf   RBC / HPF 0-2  <3 RBC/hpf   Bacteria, UA RARE  RARE  GLUCOSE, CAPILLARY      Result Value Range   Glucose-Capillary 265 (*) 70 - 99 mg/dL           1. Hyperglycemia     MDM  2:10 AM patient seen and evaluated. Patient appears well no acute distress. Normal respirations.  IV fluids given her elevated blood sugar of 411. 8 units subcutaneous insulin also given.   No signs concerning for DKA. Blood sugar improved to 265. Patient stable for discharge to follow up with PCP.  Angus Seller, PA-C 02/18/13 [phone]

## 2013-02-18 NOTE — ED Notes (Signed)
Iv not working

## 2013-02-18 NOTE — ED Notes (Signed)
The pt is incustody  Of the gpd.  She is a diabetic with high blood sugar she is alert oriented with skin warm and dry.  No odor of ketones on her breath. She is apparently going to jail after she leaves here

## 2013-02-18 NOTE — ED Provider Notes (Signed)
Medical screening examination/treatment/procedure(s) were performed by non-physician practitioner and as supervising physician I was immediately available for consultation/collaboration.   Charles B. Sheldon, MD 02/18/13 0458 

## 2013-02-18 NOTE — ED Notes (Signed)
PT. REPORTS HYPERGLYCEMIA , PT. STATED SHE RAN OUT OF INSULIN X 2 DAYS , RESPIRATIONS UNLABORED / DENIES PAIN , PT. ARRIVED WITH [employer] OFFICERS HANDCUFFED. AMBULATORY.

## 2013-03-01 ENCOUNTER — Inpatient Hospital Stay (HOSPITAL_BASED_OUTPATIENT_CLINIC_OR_DEPARTMENT_OTHER)
Admission: EM | Admit: 2013-03-01 | Discharge: 2013-03-02 | DRG: 639 | Discharge status: Against Medical Advice | Payer: Medicaid Other | Attending: Internal Medicine | Admitting: Internal Medicine

## 2013-03-01 ENCOUNTER — Encounter (HOSPITAL_BASED_OUTPATIENT_CLINIC_OR_DEPARTMENT_OTHER): Payer: Self-pay | Admitting: *Deleted

## 2013-03-01 DIAGNOSIS — R109 Unspecified abdominal pain: Secondary | ICD-10-CM | POA: Diagnosis present

## 2013-03-01 DIAGNOSIS — E1010 Type 1 diabetes mellitus with ketoacidosis without coma: Principal | ICD-10-CM | POA: Diagnosis present

## 2013-03-01 DIAGNOSIS — Z880 Allergy status to penicillin: Secondary | ICD-10-CM

## 2013-03-01 DIAGNOSIS — Z794 Long term (current) use of insulin: Secondary | ICD-10-CM

## 2013-03-01 DIAGNOSIS — E1110 Type 2 diabetes mellitus with ketoacidosis without coma: Secondary | ICD-10-CM

## 2013-03-01 LAB — COMPREHENSIVE METABOLIC PANEL
AST: 20 U/L (ref 0–37)
CO2: 18 mEq/L — ABNORMAL LOW (ref 19–32)
Chloride: 91 mEq/L — ABNORMAL LOW (ref 96–112)
Creatinine, Ser: 0.7 mg/dL (ref 0.50–1.10)
GFR calc Af Amer: >90 mL/min (ref >90)
GFR calc non Af Amer: >90 mL/min (ref >90)
Glucose, Bld: 448 mg/dL — ABNORMAL HIGH (ref 70–99)
Total Bilirubin: 0.7 mg/dL (ref 0.3–1.2)

## 2013-03-01 LAB — LIPASE, BLOOD: Lipase: 10 U/L — ABNORMAL LOW (ref 11–59)

## 2013-03-01 LAB — RAPID URINE DRUG SCREEN, HOSP PERFORMED
Amphetamines: NOT DETECTED
Tetrahydrocannabinol: NOT DETECTED

## 2013-03-01 LAB — URINALYSIS, ROUTINE W REFLEX MICROSCOPIC
Bilirubin Urine: NEGATIVE
Nitrite: NEGATIVE
Protein, ur: NEGATIVE mg/dL
Urobilinogen, UA: 0.2 mg/dL (ref 0.0–1.0)

## 2013-03-01 LAB — CBC WITH DIFFERENTIAL/PLATELET
Basophils Absolute: 0 10*3/uL (ref 0.0–0.1)
Eosinophils Relative: 1 % (ref 0–5)
HCT: 40.8 % (ref 36.0–46.0)
Hemoglobin: 13.3 g/dL (ref 12.0–15.0)
Lymphocytes Relative: 17 % (ref 12–46)
Lymphs Abs: 1.1 10*3/uL (ref 0.7–4.0)
MCV: 71.2 fL — ABNORMAL LOW (ref 78.0–100.0)
Monocytes Absolute: 0.2 10*3/uL (ref 0.1–1.0)
Monocytes Relative: 3 % (ref 3–12)
Neutro Abs: 5 10*3/uL (ref 1.7–7.7)
RBC: 5.73 MIL/uL — ABNORMAL HIGH (ref 3.87–5.11)
RDW: 15.2 % (ref 11.5–15.5)
WBC: 6.3 10*3/uL (ref 4.0–10.5)

## 2013-03-01 LAB — GLUCOSE, CAPILLARY
Glucose-Capillary: 301 mg/dL — ABNORMAL HIGH (ref 70–99)
Glucose-Capillary: 112 mg/dL — ABNORMAL HIGH (ref 70–99)
Glucose-Capillary: 246 mg/dL — ABNORMAL HIGH (ref 70–99)
Glucose-Capillary: 247 mg/dL — ABNORMAL HIGH (ref 70–99)
Glucose-Capillary: 233 mg/dL — ABNORMAL HIGH (ref 70–99)

## 2013-03-01 LAB — PREGNANCY, URINE: Preg Test, Ur: NEGATIVE

## 2013-03-01 LAB — KETONES, QUALITATIVE

## 2013-03-01 LAB — URINE MICROSCOPIC-ADD ON

## 2013-03-01 LAB — CG4 I-STAT (LACTIC ACID): Lactic Acid, Venous: 2.42 mmol/L — ABNORMAL HIGH (ref 0.5–2.2)

## 2013-03-01 LAB — POCT I-STAT 3, VENOUS BLOOD GAS (G3P V): Acid-base deficit: 8 mmol/L — ABNORMAL HIGH (ref 0.0–2.0)

## 2013-03-01 MED ORDER — SODIUM CHLORIDE 0.9 % IV SOLN: INTRAVENOUS | Status: DC

## 2013-03-01 MED ORDER — DEXTROSE 50 % IV SOLN: 25.0000 mL | INTRAVENOUS | Status: DC | PRN

## 2013-03-01 MED ORDER — SODIUM CHLORIDE 0.9 % IV SOLN
INTRAVENOUS | Status: DC
  Administered 2013-03-01: 2.4 [IU]/h via INTRAVENOUS

## 2013-03-01 MED ORDER — DEXTROSE-NACL 5-0.45 % IV SOLN
INTRAVENOUS | Status: DC
  Administered 2013-03-01: 18:00:00 via INTRAVENOUS

## 2013-03-01 MED ORDER — SODIUM CHLORIDE 0.9 % IV SOLN: INTRAVENOUS | Status: AC

## 2013-03-01 MED ORDER — SODIUM CHLORIDE 0.9 % IV SOLN
Freq: Once | INTRAVENOUS | Status: AC
  Administered 2013-03-01: 14:00:00 via INTRAVENOUS

## 2013-03-01 MED ORDER — INSULIN REGULAR HUMAN 100 UNIT/ML IJ SOLN
INTRAMUSCULAR | Status: AC
  Filled 2013-03-01: qty 1

## 2013-03-01 MED ORDER — INSULIN REGULAR HUMAN 100 UNIT/ML IJ SOLN
10.0000 [IU] | Freq: Once | INTRAMUSCULAR | Status: AC
  Administered 2013-03-01: 10 [IU] via SUBCUTANEOUS

## 2013-03-01 MED ORDER — HEPARIN SODIUM (PORCINE) 5000 UNIT/ML IJ SOLN
5000.0000 [IU] | Freq: Three times a day (TID) | INTRAMUSCULAR | Status: DC
  Filled 2013-03-01: qty 1

## 2013-03-01 MED ORDER — ONDANSETRON HCL 4 MG/2ML IJ SOLN
4.0000 mg | Freq: Once | INTRAMUSCULAR | Status: AC
  Administered 2013-03-01: 4 mg via INTRAVENOUS
  Filled 2013-03-01: qty 2

## 2013-03-01 NOTE — Progress Notes (Signed)
[facility]                                                                                Patient Demographics  Beverly Wells, is a 22 y.o. female, DOB - 09/28/1990, ZOX:[id number], WJX:[id number]  Admit date - 03/01/2013  22 year old female history of diabetes mellitus coming in from [facility] for simple DKA. Hemodynamically stable. DKA all already initiated. Please follow  chest x-ray and lipase.

## 2013-03-01 NOTE — ED Notes (Signed)
Pt. Asking for more ICE.  Pt. Has had one cup of ice already.  Encouraged Pt. To wait for an hour and see if the 1st cup stays down.

## 2013-03-01 NOTE — ED Notes (Signed)
RN unsure of orders placed by Hospitalist at [facility].   Charge RN called to speak with Hospitalist about orders placed.  RN to keep Pt. On Glucose Stabilizer and to hang D5 1/2 NS on Pt.

## 2013-03-01 NOTE — ED Notes (Signed)
Abdominal pain. Hx diabetes. She has not taken her Insulin in 2 days. Nauseated. No vomiting.

## 2013-03-01 NOTE — Progress Notes (Signed)
Pt arrived to floor and is stable.  Pt is stable HCP notified that pt. Is on the floor.   Thane Edu, RN

## 2013-03-01 NOTE — ED Notes (Signed)
Have encouraged Pt. To urinate and to not drink any fluids due to vomiting approx. on arrival to room 9.  Pt. Reports abd. Pain and not taking insulin for 2 days

## 2013-03-01 NOTE — ED Notes (Signed)
Pt. In no distress and watching TV at this time with friend at bedside.

## 2013-03-01 NOTE — ED Notes (Signed)
Dr. Thedore Mins paged to clarify admission orders regarding glucommander, insulin gtt and and order for D5 .45 NS.  Per MD order, discontinue glucommander, continue insulin gtt and change IVF to D5 .45 NS at ordered rate.

## 2013-03-01 NOTE — ED Notes (Signed)
Report given to [facility] RN. 

## 2013-03-01 NOTE — ED Provider Notes (Signed)
History    CSN: [id number] Arrival date & time 03/01/13  1252  First MD Initiated Contact with Patient 03/01/13 1323     Chief Complaint  Patient presents with  . Abdominal Pain   (Consider location/radiation/quality/duration/timing/severity/associated sxs/prior Treatment) Patient is a 22 y.o. female presenting with abdominal pain. The history is provided by the patient. No language interpreter was used.  Abdominal Pain This is a new problem. Episode onset: 2 days. The problem occurs constantly. The problem has been gradually worsening. Associated symptoms include abdominal pain, nausea and vomiting. Nothing aggravates the symptoms. She has tried nothing for the symptoms. The treatment provided no relief.  Pt is diabetic,  She has not been taking her insulin.   Pt reports she had some abdominal cramping with vomitting.  No current pain.  No fever Past Medical History  Diagnosis Date  . Diabetes mellitus without complication    History reviewed. No pertinent past surgical history. No family history on file. History  Substance Use Topics  . Smoking status: Not on file  . Smokeless tobacco: Not on file  . Alcohol Use: No   OB History   Grav Para Term Preterm Abortions TAB SAB Ect Mult Living                 Review of Systems  Gastrointestinal: Positive for nausea, vomiting and abdominal pain.  All other systems reviewed and are negative.    Allergies  Penicillins  Home Medications   Current Outpatient Rx  Name  Route  Sig  Dispense  Refill  . cetirizine (ZYRTEC) 10 MG tablet   Oral   Take 10 mg by mouth daily.         . Cetirizine HCl 10 MG CAPS   Oral   Take 1 capsule (10 mg total) by mouth daily.   14 capsule   0   . diphenhydrAMINE (BENADRYL) 25 MG tablet   Oral   Take 1 tablet (25 mg total) by mouth every 6 (six) hours.   20 tablet   0   . insulin NPH-insulin regular (NOVOLIN 70/30) (70-30) 100 UNIT/ML injection   Subcutaneous   Inject 30 Units  into the skin 2 (two) times daily with a meal.         . oxymetazoline (AFRIN NASAL SPRAY) 0.05 % nasal spray   Nasal   Place 2 sprays into the nose 2 (two) times daily.   30 mL   0    BP 122/69  Pulse 87  Temp(Src) 98.9 F (37.2 C) (Oral)  Resp 24  Wt 145 lb (65.772 kg)  SpO2 100%  LMP 02/27/2013 Physical Exam  Nursing note and vitals reviewed. Constitutional: She is oriented to person, place, and time. She appears well-developed and well-nourished.  HENT:  Head: Normocephalic and atraumatic.  Right Ear: External ear normal.  Left Ear: External ear normal.  Eyes: Conjunctivae and EOM are normal. Pupils are equal, round, and reactive to light.  Cardiovascular: Normal rate, regular rhythm and normal heart sounds.   Pulmonary/Chest: Effort normal and breath sounds normal.  Abdominal: Soft.  Musculoskeletal: Normal range of motion.  Neurological: She is alert and oriented to person, place, and time.  Skin: Skin is warm.    ED Course  Procedures (including critical care time) Labs Reviewed  GLUCOSE, CAPILLARY - Abnormal; Notable for the following:    Glucose-Capillary 386 (*)    All other components within normal limits  URINALYSIS, ROUTINE W REFLEX MICROSCOPIC   No  results found. 1. DKA (diabetic ketoacidoses)     MDM  Pt given IV fluids,   Pt placed on glucommander,   Dr. Manus Gunning in to see and examine pt,  I spoke with Dr. Thedore Mins who will admit pt.   (Pt initially stepdown but changed to telemetry)   Pt is stable.     Lonia Skinner [place], PA-C 03/01/13 2053

## 2013-03-01 NOTE — ED Notes (Signed)
After assessment pt had an episode of vomiting.

## 2013-03-02 NOTE — Progress Notes (Signed)
Pt. Arrive to department is stable with insulin drip set to 5.6 units/hours.  On arrival CBG 233. Pt. Is comfortable awaiting MD orders. Will continue to monitor.  Thane Edu, RN

## 2013-03-02 NOTE — ED Provider Notes (Signed)
Medical screening examination/treatment/procedure(s) were conducted as a shared visit with non-physician practitioner(s) and myself.  I personally evaluated the patient during the encounter  Abdominal pain with nausea and vomiting.  Out of insulin x 2 days.   Found to have DKA.  IVF, IV insulin.  Glynn Octave, MD 03/02/13 [phone]

## 2013-03-02 NOTE — Progress Notes (Signed)
Pt. CBG 257, according to glucose stabilizer pt. Insulin drip adjusted to 2.0 units/hour.  Pt. Is stable will continue to monitor.    Thane Edu, RN

## 2013-03-02 NOTE — Progress Notes (Signed)
Pt. Became anxious stated she wanted to leave.  Pt. Was educated on risk of leaving at current blood glucose level, pt. Still wanted to leave.  Pt. D/c removed fluids and IV was d/c'd intact by RN.  MD notified and pt. Left with belongs and signed AMA Paperwork.   Thane Edu, RN

## 2013-03-22 NOTE — H&P (Signed)
  Beverly Wells 22 years old female had presented to the ER at the [facility] with abdominal pain And was found to be in DKA and was transferred to [facility] for further management after being started on IV insulin infusion. On arrival patient signed out AMA even before he could examine.  Admitting diagnosis -  Diabetic ketoacidosis.  Plan -  Patient signed an AGAINST MEDICAL ADVICE even before I could examine.  Midge Minium.

## 2013-04-02 ENCOUNTER — Emergency Department (HOSPITAL_COMMUNITY)
Admission: EM | Admit: 2013-04-02 | Discharge: 2013-04-02 | Discharge status: Against Medical Advice | Payer: Medicaid Other | Attending: Emergency Medicine | Admitting: Emergency Medicine

## 2013-04-02 ENCOUNTER — Encounter (HOSPITAL_COMMUNITY): Payer: Self-pay | Admitting: Emergency Medicine

## 2013-04-02 DIAGNOSIS — R358 Other polyuria: Secondary | ICD-10-CM | POA: Insufficient documentation

## 2013-04-02 DIAGNOSIS — Z3202 Encounter for pregnancy test, result negative: Secondary | ICD-10-CM | POA: Insufficient documentation

## 2013-04-02 DIAGNOSIS — R739 Hyperglycemia, unspecified: Secondary | ICD-10-CM

## 2013-04-02 DIAGNOSIS — Z880 Allergy status to penicillin: Secondary | ICD-10-CM | POA: Insufficient documentation

## 2013-04-02 DIAGNOSIS — R631 Polydipsia: Secondary | ICD-10-CM | POA: Insufficient documentation

## 2013-04-02 DIAGNOSIS — Z91199 Patient's noncompliance with other medical treatment and regimen due to unspecified reason: Secondary | ICD-10-CM | POA: Insufficient documentation

## 2013-04-02 DIAGNOSIS — N390 Urinary tract infection, site not specified: Secondary | ICD-10-CM | POA: Insufficient documentation

## 2013-04-02 DIAGNOSIS — A499 Bacterial infection, unspecified: Secondary | ICD-10-CM | POA: Insufficient documentation

## 2013-04-02 DIAGNOSIS — E109 Type 1 diabetes mellitus without complications: Secondary | ICD-10-CM | POA: Insufficient documentation

## 2013-04-02 DIAGNOSIS — Z9119 Patient's noncompliance with other medical treatment and regimen: Secondary | ICD-10-CM | POA: Insufficient documentation

## 2013-04-02 DIAGNOSIS — Z794 Long term (current) use of insulin: Secondary | ICD-10-CM | POA: Insufficient documentation

## 2013-04-02 DIAGNOSIS — B9689 Other specified bacterial agents as the cause of diseases classified elsewhere: Secondary | ICD-10-CM | POA: Insufficient documentation

## 2013-04-02 DIAGNOSIS — J309 Allergic rhinitis, unspecified: Secondary | ICD-10-CM | POA: Insufficient documentation

## 2013-04-02 DIAGNOSIS — Z79899 Other long term (current) drug therapy: Secondary | ICD-10-CM | POA: Insufficient documentation

## 2013-04-02 DIAGNOSIS — E1110 Type 2 diabetes mellitus with ketoacidosis without coma: Secondary | ICD-10-CM | POA: Insufficient documentation

## 2013-04-02 DIAGNOSIS — R3589 Other polyuria: Secondary | ICD-10-CM | POA: Insufficient documentation

## 2013-04-02 LAB — CBC WITH DIFFERENTIAL/PLATELET
Basophils Relative: 0 % (ref 0–1)
Eosinophils Relative: 3 % (ref 0–5)
HCT: 32.6 % — ABNORMAL LOW (ref 36.0–46.0)
Hemoglobin: 10.4 g/dL — ABNORMAL LOW (ref 12.0–15.0)
Lymphs Abs: 1.4 10*3/uL (ref 0.7–4.0)
MCV: 71.5 fL — ABNORMAL LOW (ref 78.0–100.0)
Monocytes Relative: 7 % (ref 3–12)
Neutro Abs: 2.6 10*3/uL (ref 1.7–7.7)
RBC: 4.56 MIL/uL (ref 3.87–5.11)
RDW: 14.1 % (ref 11.5–15.5)
WBC: 4.4 10*3/uL (ref 4.0–10.5)

## 2013-04-02 LAB — URINALYSIS, ROUTINE W REFLEX MICROSCOPIC
Bilirubin Urine: NEGATIVE
Glucose, UA: >1000 mg/dL — AB
Ketones, ur: NEGATIVE mg/dL
Specific Gravity, Urine: 1.036 — ABNORMAL HIGH (ref 1.005–1.030)
pH: 5.5 (ref 5.0–8.0)

## 2013-04-02 LAB — BASIC METABOLIC PANEL
BUN: 15 mg/dL (ref 6–23)
CO2: 26 mEq/L (ref 19–32)
Chloride: 98 mEq/L (ref 96–112)
Creatinine, Ser: 0.64 mg/dL (ref 0.50–1.10)
Glucose, Bld: 374 mg/dL — ABNORMAL HIGH (ref 70–99)
Potassium: 5.4 mEq/L — ABNORMAL HIGH (ref 3.5–5.1)

## 2013-04-02 LAB — GLUCOSE, CAPILLARY
Glucose-Capillary: 230 mg/dL — ABNORMAL HIGH (ref 70–99)
Glucose-Capillary: 194 mg/dL — ABNORMAL HIGH (ref 70–99)

## 2013-04-02 LAB — URINE MICROSCOPIC-ADD ON

## 2013-04-02 LAB — POTASSIUM: Potassium: 3.7 mEq/L (ref 3.5–5.1)

## 2013-04-02 MED ORDER — SODIUM CHLORIDE 0.9 % IV BOLUS (SEPSIS)
1000.0000 mL | Freq: Once | INTRAVENOUS | Status: AC
  Administered 2013-04-02: 1000 mL via INTRAVENOUS

## 2013-04-02 NOTE — ED Notes (Signed)
Attempted to make contact with pt; no answer at home number; no answer at emergency contact.

## 2013-04-02 NOTE — ED Notes (Addendum)
Pt states that CBg was 120 this am. Had first day of school and ate more carbs than usual, pt took cbg at 1300 and it was 430 after taking insulin at noon.  Pt states she feels thirsty and has polyuria. Denies n/v/d or pain.

## 2013-04-02 NOTE — ED Provider Notes (Signed)
CSN: [id number]     Arrival date & time 04/02/13  1544 History     First MD Initiated Contact with Patient 04/02/13 1600     Chief Complaint  Patient presents with  . Hyperglycemia   (Consider location/radiation/quality/duration/timing/severity/associated sxs/prior Treatment) HPI Comments: Patient is a 22 year old female with a history of IDDM who presents for hyperglycemia with onset this afternoon. Patient states that she was at gym class and eating excessively to prevent hypoglycemia. Patient states she was not checking her sugars and accidentally became hyperglycemic. Patient admits to associated polyuria and polydipsia. She denies associated fever, vision changes, chest pain, shortness of breath, abdominal pain, nausea or vomiting, urinary symptoms, melena or hematochezia, diarrhea, numbness or tingling in her extremities, or syncope. She denies any recent infections, dysuria, and cough or URI symptoms. Patient currently takes 70/30 insulin BID with adequate control of her IDDM. Dr. Everardo All - Endocrinologist; PCP - Dr. Parke Simmers  Patient is a 22 y.o. female presenting with hyperglycemia. The history is provided by the patient. No language interpreter was used.  Hyperglycemia Associated symptoms: increased thirst and polyuria   Associated symptoms: no dizziness, no fever, no nausea and no vomiting     Past Medical History  Diagnosis Date  . 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 ALLERGIC RHINITIS 05/07/2010  . DKA (diabetic ketoacidoses)    Past Surgical History  Procedure Laterality Date  . Tonsillectomy     Family History  Problem Relation Age of Onset  . Diabetes Neg Hx    History  Substance Use Topics  . Smoking status: Never Smoker   . Smokeless tobacco: Never Used  . Alcohol Use: No   OB History   Grav Para Term Preterm Abortions TAB SAB Ect Mult Living                 Review of Systems  Constitutional: Negative for fever.   Eyes: Negative for visual disturbance.  Respiratory: Negative for cough.   Cardiovascular: Negative for palpitations.  Gastrointestinal: Negative for nausea and vomiting.  Endocrine: Positive for polydipsia and polyuria.  Neurological: Negative for dizziness, syncope, weakness and light-headedness.  All other systems reviewed and are negative.    Allergies  Banana; Orange concentrate; and Penicillins  Home Medications   Current Outpatient Rx  Name  Route  Sig  Dispense  Refill  . cetirizine (ZYRTEC) 10 MG tablet   Oral   Take 10 mg by mouth daily.           . insulin aspart protamine- aspart (NOVOLOG MIX 70/30) (70-30) 100 UNIT/ML injection   Subcutaneous   Inject 30 Units into the skin 2 (two) times daily with a meal.          BP 121/70  Pulse 67  Temp(Src) 98.3 F (36.8 C) (Oral)  Resp 18  SpO2 100%  Physical Exam  Nursing note and vitals reviewed. Constitutional: She is oriented to person, place, and time. She appears well-developed and well-nourished. No distress.  HENT:  Head: Normocephalic and atraumatic.  Mouth/Throat: No oropharyngeal exudate.  Oropharynx clear, mucous membranes dry  Eyes: Conjunctivae and EOM are normal. No scleral icterus.  Neck: Normal range of motion. Neck supple.  Cardiovascular: Normal rate, regular rhythm, normal heart sounds and intact distal pulses.   Pulmonary/Chest: Effort normal and breath sounds normal. No respiratory distress. She has no wheezes. She has no rales.  Abdominal: Soft. She exhibits no distension. There is no tenderness.  Musculoskeletal: Normal range of motion.  Neurological: She is alert and oriented to person, place, and time. She has normal reflexes. She exhibits normal muscle tone. GCS eye subscore is 4. GCS verbal subscore is 5. GCS motor subscore is 6.  Skin: Skin is warm and dry. No rash noted. She is not diaphoretic. No erythema. No pallor.  Psychiatric: She has a normal mood and affect. Her behavior is  normal.   ED Course   Procedures (including critical care time)  Labs Reviewed  GLUCOSE, CAPILLARY - Abnormal; Notable for the following:    Glucose-Capillary 323 (*)    All other components within normal limits  CBC WITH DIFFERENTIAL - Abnormal; Notable for the following:    Hemoglobin 10.4 (*)    HCT 32.6 (*)    MCV 71.5 (*)    MCH 22.8 (*)    All other components within normal limits  BASIC METABOLIC PANEL - Abnormal; Notable for the following:    Sodium 133 (*)    Potassium 5.4 (*)    Glucose, Bld 374 (*)    All other components within normal limits  URINALYSIS, ROUTINE W REFLEX MICROSCOPIC - Abnormal; Notable for the following:    APPearance CLOUDY (*)    Specific Gravity, Urine 1.036 (*)    Glucose, UA >1000 (*)    Hgb urine dipstick TRACE (*)    Nitrite POSITIVE (*)    All other components within normal limits  GLUCOSE, CAPILLARY - Abnormal; Notable for the following:    Glucose-Capillary 230 (*)    All other components within normal limits  URINE MICROSCOPIC-ADD ON - Abnormal; Notable for the following:    Bacteria, UA MANY (*)    All other components within normal limits  URINE CULTURE  POTASSIUM  POCT PREGNANCY, URINE   No results found.  1. UTI (urinary tract infection)   2. Hyperglycemia    MDM  4:30 - Patient is a 22 year old female with a history of insulin-dependent diabetes mellitus who presents for hyperglycemia secondary to increased PO intake. Patient followed by endocrinology and admits to compliance with insulin; states she ate too much today in and effort to prevent hypoglycemia during gym class. Patient well and nontoxic appearing and afebrile; denies recent illnesses, cough, or URI symptoms. CBG on arrival 323. W/u to include CBC, BMP, UA, and urine pregnancy. IVF bolus ordered. Hemodynamically stable; will continue to monitor.  6:30 - CBG trending down to 230; labs with hyperkalemia of 5.4. Will repeat potassium. UA findings c/w with UTI. Patient  afebrile without complicating features to suggest pyelonephritis. Will need outpatient tx with PO abx.  7:30 - Repeat K+ pending  9:00 - Went to see patient and patient not in room. Left AMA.  9:15 - Called patient's residence and spoke with female individual who said patient not at home; left message for patient to call ED. Will need script called in for Keflex BID for UTI.     Antony Madura, PA-C 04/02/13 2125

## 2013-04-02 NOTE — ED Notes (Signed)
went in to check on pt and pt not in room  IV tubing in room but saline lock with cath no where to be found  Pt left hospital without being discharged  Charge nurse notified  Attempted to call pt  Pt not home

## 2013-04-03 NOTE — ED Notes (Signed)
No answer again at pt's number; Communications called and advised to send officer to house to check to see if pt still had IV intact.

## 2013-04-03 NOTE — ED Notes (Signed)
[employer] went to home to check to see if pt had IV intact; per pt and [employer] pt did not have an IV intact to RT arm; pt states that she removed her IV prior to leaving and put it in the sharps container.

## 2013-04-03 NOTE — ED Notes (Signed)
Attempted to contact pt again with no answer.

## 2013-04-03 NOTE — ED Notes (Signed)
Attempted to contact pt at number provided on demographics; answering machine picked up and left pt a message to return to call to ER within the hour.

## 2013-04-04 LAB — URINE CULTURE

## 2013-04-04 NOTE — ED Provider Notes (Signed)
Medical screening examination/treatment/procedure(s) were performed by non-physician practitioner and as supervising physician I was immediately available for consultation/collaboration.    Vida Roller, MD 04/04/13 [phone]

## 2013-04-05 NOTE — Discharge Summary (Signed)
Discharge Summary -  Beverly Wells, Beverly Wells 22 years old female had presented to the ER at the [facility] with abdominal pain  And was found to be in DKA and was transferred to [facility] for further management after being started on IV insulin infusion.   On arrival at [facility] patient signed out AMA even before I could examine.   Discharge diagnosis -   Diabetic ketoacidosis.   Patient left against medical advice.

## 2013-04-05 NOTE — ED Notes (Addendum)
Patient left AMA so no follow up necessary per Margarita Rana

## 2013-04-27 ENCOUNTER — Emergency Department (HOSPITAL_COMMUNITY)
Admission: EM | Admit: 2013-04-27 | Discharge: 2013-04-28 | Disposition: A | Discharge status: Home / Self Care | Payer: Medicaid Other | Attending: Emergency Medicine | Admitting: Emergency Medicine

## 2013-04-27 ENCOUNTER — Encounter (HOSPITAL_COMMUNITY): Payer: Self-pay | Admitting: Cardiology

## 2013-04-27 DIAGNOSIS — R739 Hyperglycemia, unspecified: Secondary | ICD-10-CM

## 2013-04-27 DIAGNOSIS — E119 Type 2 diabetes mellitus without complications: Secondary | ICD-10-CM | POA: Insufficient documentation

## 2013-04-27 DIAGNOSIS — Z794 Long term (current) use of insulin: Secondary | ICD-10-CM | POA: Insufficient documentation

## 2013-04-27 DIAGNOSIS — Z79899 Other long term (current) drug therapy: Secondary | ICD-10-CM | POA: Insufficient documentation

## 2013-04-27 DIAGNOSIS — Z880 Allergy status to penicillin: Secondary | ICD-10-CM | POA: Insufficient documentation

## 2013-04-27 LAB — URINALYSIS, ROUTINE W REFLEX MICROSCOPIC
Glucose, UA: >1000 mg/dL — AB
Hgb urine dipstick: NEGATIVE
Ketones, ur: NEGATIVE mg/dL
Leukocytes, UA: NEGATIVE
Protein, ur: NEGATIVE mg/dL
pH: 6.5 (ref 5.0–8.0)

## 2013-04-27 LAB — POCT I-STAT, CHEM 8
BUN: 14 mg/dL (ref 6–23)
Calcium, Ion: 1.25 mmol/L — ABNORMAL HIGH (ref 1.12–1.23)
Chloride: 94 mEq/L — ABNORMAL LOW (ref 96–112)
Creatinine, Ser: 1 mg/dL (ref 0.50–1.10)
TCO2: 26 mmol/L (ref 0–100)

## 2013-04-27 LAB — URINE MICROSCOPIC-ADD ON

## 2013-04-27 LAB — GLUCOSE, CAPILLARY: Glucose-Capillary: 483 mg/dL — ABNORMAL HIGH (ref 70–99)

## 2013-04-27 MED ORDER — INSULIN ASPART 100 UNIT/ML ~~LOC~~ SOLN
10.0000 [IU] | Freq: Once | SUBCUTANEOUS | Status: AC
  Administered 2013-04-27: 10 [IU] via INTRAVENOUS
  Filled 2013-04-27: qty 1

## 2013-04-27 MED ORDER — SODIUM CHLORIDE 0.9 % IV SOLN
1000.0000 mL | Freq: Once | INTRAVENOUS | Status: AC
  Administered 2013-04-27: 1000 mL via INTRAVENOUS

## 2013-04-27 MED ORDER — SODIUM CHLORIDE 0.9 % IV SOLN
1000.0000 mL | INTRAVENOUS | Status: DC
  Administered 2013-04-27: 1000 mL via INTRAVENOUS

## 2013-04-27 MED ORDER — SODIUM CHLORIDE 0.9 % IV SOLN
INTRAVENOUS | Status: DC
  Filled 2013-04-27: qty 1

## 2013-04-27 MED ORDER — SODIUM CHLORIDE 0.9 % IV BOLUS (SEPSIS): 1000.0000 mL | Freq: Once | INTRAVENOUS | Status: DC

## 2013-04-27 NOTE — ED Notes (Signed)
Pt reports she has been out of her insulin for about a week and is having trouble because the medication she use to take is not longer offered. Needs a PCP. No other complaints.

## 2013-04-27 NOTE — ED Notes (Signed)
CBG- 264 

## 2013-04-27 NOTE — ED Notes (Signed)
Pt alert, NAD, calm, interactive, family at [facility], states, "feel fine", IVF infusing w.o. to gravity, IV site U.

## 2013-04-27 NOTE — ED Provider Notes (Signed)
CSN: [id number]     Arrival date & time 04/27/13  1752 History  This chart was scribed for non-physician practitioner, Felicie Morn, NP, working with Dagmar Hait, MD by Shari Heritage, ED Scribe. This patient was seen in room TR04C/TR04C and the patient's care was started at 8:18 PM.    Chief Complaint  Patient presents with  . Medication Refill    Patient is a 22 y.o. female presenting with hyperglycemia. The history is provided by the patient. No language interpreter was used.  Hyperglycemia Blood sugar level PTA:  264 Severity:  Mild Onset quality:  Gradual Chronicity:  Recurrent Diabetes status:  Controlled with insulin Current diabetic therapy:  Humulin Associated symptoms: no abdominal pain, no chest pain, no confusion, no fever and no shortness of breath     HPI Comments: Marissia Blackham is a 22 y.o. female with a history of DM presents to the ED in need of a medication refill. She states that she has been out of her prescription for Novolin 70/30 quick pen and she ws unable to get this filled. She denies fever, abdominal pain, sore throat, chest pain, headache, visual disturbance, numbness, tingling or weakness of the extremities, rash, sore throat, shortness of breath, confusion, history of bleeding easily, urinary symptoms or any others at this time. She reports no other medical history.  Past Medical History  Diagnosis Date  . Diabetes mellitus without complication    History reviewed. No pertinent past surgical history. History reviewed. No pertinent family history. History  Substance Use Topics  . Smoking status: Never Smoker   . Smokeless tobacco: Not on file  . Alcohol Use: No   OB History   Grav Para Term Preterm Abortions TAB SAB Ect Mult Living                 Review of Systems  Constitutional: Negative for fever.  HENT: Negative for sore throat.   Eyes: Negative for visual disturbance.  Respiratory: Negative for shortness of breath.    Cardiovascular: Negative for chest pain.  Gastrointestinal: Negative for abdominal pain.  Genitourinary: Negative for difficulty urinating.  Musculoskeletal: Negative for back pain.  Skin: Negative for rash.  Neurological: Negative for headaches.  Hematological: Does not bruise/bleed easily.  Psychiatric/Behavioral: Negative for confusion.    Allergies  Penicillins  Home Medications   Current Outpatient Rx  Name  Route  Sig  Dispense  Refill  . cetirizine (ZYRTEC) 10 MG tablet   Oral   Take 10 mg by mouth daily.         . insulin NPH-regular (NOVOLIN 70/30) (70-30) 100 UNIT/ML injection   Subcutaneous   Inject 30 Units into the skin 2 (two) times daily with a meal.          Triage Vitals: BP 99/76  Pulse 91  Temp(Src) 98.1 F (36.7 C) (Oral)  Resp 18  SpO2 98% Physical Exam  Nursing note and vitals reviewed. Constitutional: She is oriented to person, place, and time. She appears well-developed and well-nourished. No distress.  HENT:  Head: Normocephalic and atraumatic.  Eyes: EOM are normal.  Neck: Neck supple. No tracheal deviation present.  Cardiovascular: Normal rate, regular rhythm and normal heart sounds.   Pulmonary/Chest: Effort normal and breath sounds normal. No respiratory distress.  Musculoskeletal: Normal range of motion.  Neurological: She is alert and oriented to person, place, and time.  Skin: Skin is warm and dry.  Psychiatric: She has a normal mood and affect. Her behavior is normal.  ED Course  Procedures (including critical care time) DIAGNOSTIC STUDIES: Oxygen Saturation is 98% on room air, normal by my interpretation.    COORDINATION OF CARE: 8:21 PM- Patient was unable to get prescription for Novlin 70/30 filled because the script was sent for regular injectable insulin, rather than the quick pen. Will order additional labs and send corrected script. Patient informed of current plan for treatment and evaluation and agrees with plan at  this time.     Labs Review Labs Reviewed  GLUCOSE, CAPILLARY - Abnormal; Notable for the following:    Glucose-Capillary 264 (*)    All other components within normal limits  GLUCOSE, CAPILLARY - Abnormal; Notable for the following:    Glucose-Capillary 483 (*)    All other components within normal limits  POCT I-STAT, CHEM 8 - Abnormal; Notable for the following:    Chloride 94 (*)    Glucose, Bld 624 (*)    Calcium, Ion 1.25 (*)    All other components within normal limits  URINALYSIS, ROUTINE W REFLEX MICROSCOPIC   Imaging Review No results found.  Patient discussed with Dr. Denton Lank.  Will provide IV hydration, insulin coverage to reduce blood sugar to below 400, then patient may be discharged home.  Patient is scheduled to see a new endocrinologist at the end of September.  She needs a prescription for humulin 70/30 kwikpen.  MDM  Hyperglycemia without DKA.  I personally performed the services described in this documentation, which was scribed in my presence. The recorded information has been reviewed and is accurate.      Jimmye Norman, NP 04/28/13 [phone]

## 2013-04-28 LAB — GLUCOSE, CAPILLARY: Glucose-Capillary: 437 mg/dL — ABNORMAL HIGH (ref 70–99)

## 2013-04-28 MED ORDER — INSULIN ISOPHANE & REGULAR (HUMAN 70-30)100 UNIT/ML KWIKPEN: 30.0000 [IU] | PEN_INJECTOR | Freq: Two times a day (BID) | SUBCUTANEOUS | Status: DC

## 2013-04-28 MED ORDER — INSULIN ASPART 100 UNIT/ML ~~LOC~~ SOLN
10.0000 [IU] | Freq: Once | SUBCUTANEOUS | Status: DC
  Filled 2013-04-28: qty 1

## 2013-04-28 MED ORDER — INSULIN NPH ISOPHANE & REGULAR (70-30) 100 UNIT/ML ~~LOC~~ SUSP: 30.0000 [IU] | Freq: Two times a day (BID) | SUBCUTANEOUS | Status: DC

## 2013-04-28 MED ORDER — INSULIN ASPART 100 UNIT/ML ~~LOC~~ SOLN
10.0000 [IU] | Freq: Once | SUBCUTANEOUS | Status: AC
  Administered 2013-04-28: 10 [IU] via SUBCUTANEOUS

## 2013-04-28 NOTE — ED Notes (Signed)
EDP aware of cbg. Pending d/c instructions. Pt sleeping, family sleeping at [facility].

## 2013-04-28 NOTE — ED Provider Notes (Signed)
Medical screening examination/treatment/procedure(s) were performed by non-physician practitioner and as supervising physician I was immediately available for consultation/collaboration. Pt seen by NP Smith/Dr Gwendolyn Grant, who temporarily txd pt in room on my pod.   Suzi Roots, MD 04/28/13 [phone]

## 2013-04-28 NOTE — ED Notes (Signed)
No changes, continuing to receive IVF bolus, 2 liters infusing w.o. to gravity

## 2013-07-07 ENCOUNTER — Encounter (HOSPITAL_COMMUNITY): Payer: Self-pay | Admitting: Emergency Medicine

## 2013-07-07 DIAGNOSIS — R110 Nausea: Secondary | ICD-10-CM | POA: Insufficient documentation

## 2013-07-07 DIAGNOSIS — E119 Type 2 diabetes mellitus without complications: Secondary | ICD-10-CM | POA: Insufficient documentation

## 2013-07-07 NOTE — ED Notes (Signed)
CBG 252 
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
## 2013-07-07 NOTE — ED Notes (Signed)
Pt c/o hyperglycemia with nausea denies vomiting. Reports home monitor glucose level 570. Prior to arrival self administered insulin 12 units

## 2013-07-08 ENCOUNTER — Emergency Department (HOSPITAL_COMMUNITY)
Admission: EM | Admit: 2013-07-08 | Discharge: 2013-07-08 | Discharge status: Against Medical Advice | Payer: Medicaid Other | Attending: Emergency Medicine | Admitting: Emergency Medicine

## 2013-07-08 LAB — COMPREHENSIVE METABOLIC PANEL
AST: 26 U/L (ref 0–37)
Albumin: 3.9 g/dL (ref 3.5–5.2)
BUN: 9 mg/dL (ref 6–23)
Calcium: 9.3 mg/dL (ref 8.4–10.5)
Chloride: 97 mEq/L (ref 96–112)
Creatinine, Ser: 0.58 mg/dL (ref 0.50–1.10)
GFR calc non Af Amer: >90 mL/min (ref >90)
Total Bilirubin: 0.2 mg/dL — ABNORMAL LOW (ref 0.3–1.2)

## 2013-07-08 LAB — CBC
HCT: 36.6 % (ref 36.0–46.0)
MCH: 23.2 pg — ABNORMAL LOW (ref 26.0–34.0)
MCV: 71.3 fL — ABNORMAL LOW (ref 78.0–100.0)
Platelets: 276 10*3/uL (ref 150–400)
RDW: 15.3 % (ref 11.5–15.5)

## 2013-07-08 NOTE — ED Notes (Signed)
Not in waiting rm when called.

## 2013-07-09 ENCOUNTER — Encounter (HOSPITAL_COMMUNITY): Payer: Self-pay | Admitting: Emergency Medicine

## 2013-08-17 ENCOUNTER — Emergency Department (HOSPITAL_BASED_OUTPATIENT_CLINIC_OR_DEPARTMENT_OTHER)
Admission: EM | Admit: 2013-08-17 | Discharge: 2013-08-17 | Disposition: A | Discharge status: Home / Self Care | Payer: Medicaid Other | Attending: Emergency Medicine | Admitting: Emergency Medicine

## 2013-08-17 ENCOUNTER — Encounter (HOSPITAL_BASED_OUTPATIENT_CLINIC_OR_DEPARTMENT_OTHER): Payer: Self-pay | Admitting: Emergency Medicine

## 2013-08-17 DIAGNOSIS — Z794 Long term (current) use of insulin: Secondary | ICD-10-CM | POA: Insufficient documentation

## 2013-08-17 DIAGNOSIS — Z760 Encounter for issue of repeat prescription: Secondary | ICD-10-CM | POA: Insufficient documentation

## 2013-08-17 DIAGNOSIS — Z880 Allergy status to penicillin: Secondary | ICD-10-CM | POA: Insufficient documentation

## 2013-08-17 DIAGNOSIS — E1110 Type 2 diabetes mellitus with ketoacidosis without coma: Secondary | ICD-10-CM | POA: Insufficient documentation

## 2013-08-17 DIAGNOSIS — Z79899 Other long term (current) drug therapy: Secondary | ICD-10-CM | POA: Insufficient documentation

## 2013-08-17 DIAGNOSIS — R739 Hyperglycemia, unspecified: Secondary | ICD-10-CM

## 2013-08-17 LAB — GLUCOSE, CAPILLARY: Glucose-Capillary: 450 mg/dL — ABNORMAL HIGH (ref 70–99)

## 2013-08-17 MED ORDER — INSULIN NPH ISOPHANE & REGULAR (70-30) 100 UNIT/ML ~~LOC~~ SUSP: 30.0000 [IU] | Freq: Two times a day (BID) | SUBCUTANEOUS | Status: DC

## 2013-08-17 MED ORDER — INSULIN ISOPHANE & REGULAR (HUMAN 70-30)100 UNIT/ML KWIKPEN: 30.0000 [IU] | PEN_INJECTOR | Freq: Two times a day (BID) | SUBCUTANEOUS | Status: DC

## 2013-08-17 NOTE — ED Provider Notes (Signed)
CSN: [id number]     Arrival date & time 08/17/13  1003 History   First MD Initiated Contact with Patient 08/17/13 1121     Chief Complaint  Patient presents with  . Medication Refill   (Consider location/radiation/quality/duration/timing/severity/associated sxs/prior Treatment) HPI Comments: Patient is a 23 year old female who presents with complaints of being out of her insulin. She ran out of her Humulin 70/30 yesterday and has no primary Dr. to write this for her. She is otherwise without complaint.  The history is provided by the patient.    Past Medical History  Diagnosis Date  . 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 ALLERGIC RHINITIS 05/07/2010  . DKA (diabetic ketoacidoses)   . Diabetes mellitus without complication    Past Surgical History  Procedure Laterality Date  . Tonsillectomy     Family History  Problem Relation Age of Onset  . Diabetes Neg Hx    History  Substance Use Topics  . Smoking status: Never Smoker   . Smokeless tobacco: Not on file  . Alcohol Use: No   OB History   Grav Para Term Preterm Abortions TAB SAB Ect Mult Living                 Review of Systems  All other systems reviewed and are negative.    Allergies  Penicillins; Banana; Orange concentrate; and Penicillins  Home Medications   Current Outpatient Rx  Name  Route  Sig  Dispense  Refill  . cetirizine (ZYRTEC) 10 MG tablet   Oral   Take 10 mg by mouth daily.           . Insulin Isophane & Regular (HUMULIN 70/30 KWIKPEN) (70-30) 100 UNIT/ML SUPN   Subcutaneous   Inject 30 Units into the skin 2 (two) times daily. With a meal   3 mL   3   . cetirizine (ZYRTEC) 10 MG tablet   Oral   Take 10 mg by mouth daily.         . insulin aspart protamine- aspart (NOVOLOG MIX 70/30) (70-30) 100 UNIT/ML injection   Subcutaneous   Inject 30 Units into the skin 2 (two) times daily with a meal.         . insulin NPH-regular (NOVOLIN 70/30)  (70-30) 100 UNIT/ML injection   Subcutaneous   Inject 30 Units into the skin 2 (two) times daily with a meal.         . insulin NPH-regular (NOVOLIN 70/30) (70-30) 100 UNIT/ML injection   Subcutaneous   Inject 30 Units into the skin 2 (two) times daily with a meal.   30 mL   0    BP 112/65  Temp(Src) 98.5 F (36.9 C) (Oral)  Resp 20  Ht 5\' 7"  (1.702 m)  Wt 157 lb (71.215 kg)  BMI 24.58 kg/m2  SpO2 100%  LMP 08/12/2013 Physical Exam  Nursing note and vitals reviewed. Constitutional: She is oriented to person, place, and time. She appears well-developed and well-nourished.  HENT:  Head: Normocephalic and atraumatic.  Neck: Normal range of motion. Neck supple.  Musculoskeletal: Normal range of motion.  Neurological: She is alert and oriented to person, place, and time.  Skin: Skin is warm and dry.    ED Course  Procedures (including critical care time) Labs Review Labs Reviewed  GLUCOSE, CAPILLARY - Abnormal; Notable for the following:    Glucose-Capillary 450 (*)    All other components within normal limits  Imaging Review No results found.    MDM  No diagnosis found. Will refill her medications. Discharged to home return as needed.    Geoffery Lyonsouglas Idrissa Beville, MD 08/17/13 1130

## 2013-08-17 NOTE — ED Notes (Signed)
Ran out of insulin last night.  Last prescribed by [facility] with 10 refills.  Does not have an Endocrinologist at this time.

## 2013-08-17 NOTE — Discharge Instructions (Signed)
Medication Refill, Emergency Department  We have refilled your medication today as a courtesy to you. It is best for your medical care, however, to take care of getting refills done through your primary caregiver's office. They have your records and can do a better job of follow-up than we can in the emergency department.  On maintenance medications, we often only prescribe enough medications to get you by until you are able to see your regular caregiver. This is a more expensive way to refill medications.  In the future, please plan for refills so that you will not have to use the emergency department for this.  Thank you for your help. Your help allows us to better take care of the daily emergencies that enter our department.  Document Released: 11/19/2003 Document Revised: 10/25/2011 Document Reviewed: 08/02/2005  [employer]® Patient Information ©2014 [employer].

## 2013-09-06 ENCOUNTER — Inpatient Hospital Stay (HOSPITAL_COMMUNITY)
Admission: EM | Admit: 2013-09-06 | Discharge: 2013-09-07 | DRG: 639 | Disposition: A | Discharge status: Home / Self Care | Payer: Medicaid Other | Attending: Internal Medicine | Admitting: Internal Medicine

## 2013-09-06 ENCOUNTER — Encounter (HOSPITAL_COMMUNITY): Payer: Self-pay | Admitting: Emergency Medicine

## 2013-09-06 DIAGNOSIS — E1110 Type 2 diabetes mellitus with ketoacidosis without coma: Secondary | ICD-10-CM | POA: Diagnosis present

## 2013-09-06 DIAGNOSIS — R1110 Vomiting, unspecified: Secondary | ICD-10-CM

## 2013-09-06 DIAGNOSIS — Z794 Long term (current) use of insulin: Secondary | ICD-10-CM

## 2013-09-06 DIAGNOSIS — E109 Type 1 diabetes mellitus without complications: Secondary | ICD-10-CM

## 2013-09-06 DIAGNOSIS — Z9119 Patient's noncompliance with other medical treatment and regimen: Secondary | ICD-10-CM

## 2013-09-06 DIAGNOSIS — K047 Periapical abscess without sinus: Secondary | ICD-10-CM

## 2013-09-06 DIAGNOSIS — R911 Solitary pulmonary nodule: Secondary | ICD-10-CM

## 2013-09-06 DIAGNOSIS — E860 Dehydration: Secondary | ICD-10-CM | POA: Diagnosis present

## 2013-09-06 DIAGNOSIS — Z91199 Patient's noncompliance with other medical treatment and regimen due to unspecified reason: Secondary | ICD-10-CM

## 2013-09-06 DIAGNOSIS — D649 Anemia, unspecified: Secondary | ICD-10-CM | POA: Diagnosis present

## 2013-09-06 DIAGNOSIS — R1115 Cyclical vomiting syndrome unrelated to migraine: Secondary | ICD-10-CM

## 2013-09-06 DIAGNOSIS — E1010 Type 1 diabetes mellitus with ketoacidosis without coma: Principal | ICD-10-CM | POA: Diagnosis present

## 2013-09-06 DIAGNOSIS — J069 Acute upper respiratory infection, unspecified: Secondary | ICD-10-CM

## 2013-09-06 DIAGNOSIS — N920 Excessive and frequent menstruation with regular cycle: Secondary | ICD-10-CM | POA: Diagnosis present

## 2013-09-06 DIAGNOSIS — J309 Allergic rhinitis, unspecified: Secondary | ICD-10-CM

## 2013-09-06 LAB — GLUCOSE, CAPILLARY
GLUCOSE-CAPILLARY: 149 mg/dL — AB (ref 70–99)
GLUCOSE-CAPILLARY: 162 mg/dL — AB (ref 70–99)
Glucose-Capillary: 382 mg/dL — ABNORMAL HIGH (ref 70–99)
Glucose-Capillary: 347 mg/dL — ABNORMAL HIGH (ref 70–99)
Glucose-Capillary: 329 mg/dL — ABNORMAL HIGH (ref 70–99)
Glucose-Capillary: 299 mg/dL — ABNORMAL HIGH (ref 70–99)
Glucose-Capillary: 215 mg/dL — ABNORMAL HIGH (ref 70–99)
Glucose-Capillary: 191 mg/dL — ABNORMAL HIGH (ref 70–99)

## 2013-09-06 LAB — COMPREHENSIVE METABOLIC PANEL
ALK PHOS: 88 U/L (ref 39–117)
ALT: 30 U/L (ref 0–35)
AST: 59 U/L — ABNORMAL HIGH (ref 0–37)
Albumin: 4.4 g/dL (ref 3.5–5.2)
BUN: 11 mg/dL (ref 6–23)
CO2: 19 mEq/L (ref 19–32)
CREATININE: 0.59 mg/dL (ref 0.50–1.10)
Calcium: 9.6 mg/dL (ref 8.4–10.5)
Chloride: 96 mEq/L (ref 96–112)
GFR calc non Af Amer: >90 mL/min (ref >90)
GLUCOSE: 398 mg/dL — AB (ref 70–99)
POTASSIUM: 4.3 meq/L (ref 3.7–5.3)
Sodium: 136 mEq/L — ABNORMAL LOW (ref 137–147)
TOTAL PROTEIN: 8.4 g/dL — AB (ref 6.0–8.3)
Total Bilirubin: 0.6 mg/dL (ref 0.3–1.2)

## 2013-09-06 LAB — URINALYSIS, ROUTINE W REFLEX MICROSCOPIC
Bilirubin Urine: NEGATIVE
Glucose, UA: >1000 mg/dL — AB
LEUKOCYTES UA: NEGATIVE
NITRITE: NEGATIVE
Protein, ur: NEGATIVE mg/dL
SPECIFIC GRAVITY, URINE: 1.04 — AB (ref 1.005–1.030)
UROBILINOGEN UA: 0.2 mg/dL (ref 0.0–1.0)
pH: 5.5 (ref 5.0–8.0)

## 2013-09-06 LAB — CBC WITH DIFFERENTIAL/PLATELET
BASOS ABS: 0 10*3/uL (ref 0.0–0.1)
Basophils Relative: 0 % (ref 0–1)
Eosinophils Absolute: 0.1 10*3/uL (ref 0.0–0.7)
Eosinophils Relative: 1 % (ref 0–5)
HCT: 39.5 % (ref 36.0–46.0)
Hemoglobin: 13 g/dL (ref 12.0–15.0)
LYMPHS ABS: 1.1 10*3/uL (ref 0.7–4.0)
Lymphocytes Relative: 12 % (ref 12–46)
MCH: 23.6 pg — AB (ref 26.0–34.0)
MCHC: 32.9 g/dL (ref 30.0–36.0)
MCV: 71.6 fL — AB (ref 78.0–100.0)
MONO ABS: 0.4 10*3/uL (ref 0.1–1.0)
MONOS PCT: 4 % (ref 3–12)
NEUTROS ABS: 7.8 10*3/uL — AB (ref 1.7–7.7)
Neutrophils Relative %: 83 % — ABNORMAL HIGH (ref 43–77)
Platelets: 266 10*3/uL (ref 150–400)
RBC: 5.52 MIL/uL — ABNORMAL HIGH (ref 3.87–5.11)
RDW: 14.7 % (ref 11.5–15.5)
WBC: 9.4 10*3/uL (ref 4.0–10.5)

## 2013-09-06 LAB — LIPASE, BLOOD: LIPASE: 11 U/L (ref 11–59)

## 2013-09-06 LAB — POCT PREGNANCY, URINE: Preg Test, Ur: NEGATIVE

## 2013-09-06 LAB — URINE MICROSCOPIC-ADD ON

## 2013-09-06 MED ORDER — SODIUM CHLORIDE 0.9 % IV SOLN: INTRAVENOUS | Status: DC

## 2013-09-06 MED ORDER — SODIUM CHLORIDE 0.9 % IV BOLUS (SEPSIS)
1000.0000 mL | Freq: Once | INTRAVENOUS | Status: AC
  Administered 2013-09-06: 1000 mL via INTRAVENOUS

## 2013-09-06 MED ORDER — ONDANSETRON HCL 4 MG/2ML IJ SOLN
4.0000 mg | Freq: Once | INTRAMUSCULAR | Status: AC
  Administered 2013-09-06: 4 mg via INTRAVENOUS
  Filled 2013-09-06: qty 2

## 2013-09-06 MED ORDER — SENNA 8.6 MG PO TABS
1.0000 | ORAL_TABLET | Freq: Two times a day (BID) | ORAL | Status: DC
  Administered 2013-09-07: 8.6 mg via ORAL
  Filled 2013-09-06 (×3): qty 1

## 2013-09-06 MED ORDER — ONDANSETRON 4 MG PO TBDP
ORAL_TABLET | ORAL | Status: AC
  Filled 2013-09-06: qty 2

## 2013-09-06 MED ORDER — ONDANSETRON 4 MG PO TBDP
8.0000 mg | ORAL_TABLET | Freq: Once | ORAL | Status: AC
  Administered 2013-09-06: 8 mg via ORAL

## 2013-09-06 MED ORDER — INSULIN REGULAR HUMAN 100 UNIT/ML IJ SOLN
INTRAMUSCULAR | Status: DC
  Administered 2013-09-06: 2.9 [IU]/h via INTRAVENOUS
  Filled 2013-09-06: qty 1

## 2013-09-06 MED ORDER — SODIUM CHLORIDE 0.9 % IV SOLN
INTRAVENOUS | Status: DC
  Administered 2013-09-06: 5.7 [IU]/h via INTRAVENOUS
  Filled 2013-09-06: qty 1

## 2013-09-06 MED ORDER — ACETAMINOPHEN 500 MG PO TABS: 1000.0000 mg | ORAL_TABLET | Freq: Every day | ORAL | Status: DC | PRN

## 2013-09-06 MED ORDER — DEXTROSE-NACL 5-0.45 % IV SOLN: INTRAVENOUS | Status: DC

## 2013-09-06 MED ORDER — HEPARIN SODIUM (PORCINE) 5000 UNIT/ML IJ SOLN
5000.0000 [IU] | Freq: Three times a day (TID) | INTRAMUSCULAR | Status: DC
  Filled 2013-09-06 (×5): qty 1

## 2013-09-06 MED ORDER — INSULIN REGULAR BOLUS VIA INFUSION
0.0000 [IU] | Freq: Three times a day (TID) | INTRAVENOUS | Status: DC
  Filled 2013-09-06: qty 10

## 2013-09-06 MED ORDER — SODIUM CHLORIDE 0.9 % IV SOLN
1000.0000 mL | INTRAVENOUS | Status: DC
  Administered 2013-09-06: 1000 mL via INTRAVENOUS

## 2013-09-06 MED ORDER — LORATADINE 10 MG PO TABS
10.0000 mg | ORAL_TABLET | Freq: Every day | ORAL | Status: DC
  Administered 2013-09-06 – 2013-09-07 (×2): 10 mg via ORAL
  Filled 2013-09-06 (×2): qty 1

## 2013-09-06 MED ORDER — ACETAMINOPHEN 325 MG PO TABS: 650.0000 mg | ORAL_TABLET | Freq: Four times a day (QID) | ORAL | Status: DC | PRN

## 2013-09-06 MED ORDER — KETOROLAC TROMETHAMINE 30 MG/ML IJ SOLN
30.0000 mg | Freq: Once | INTRAMUSCULAR | Status: AC
  Administered 2013-09-06: 30 mg via INTRAVENOUS
  Filled 2013-09-06: qty 1

## 2013-09-06 MED ORDER — DEXTROSE-NACL 5-0.45 % IV SOLN
INTRAVENOUS | Status: DC
  Administered 2013-09-07: via INTRAVENOUS

## 2013-09-06 MED ORDER — DOCUSATE SODIUM 100 MG PO CAPS
100.0000 mg | ORAL_CAPSULE | Freq: Two times a day (BID) | ORAL | Status: DC
  Administered 2013-09-07: 100 mg via ORAL
  Filled 2013-09-06 (×2): qty 1

## 2013-09-06 MED ORDER — SODIUM CHLORIDE 0.9 % IV SOLN
1000.0000 mL | Freq: Once | INTRAVENOUS | Status: AC
  Administered 2013-09-06: 1000 mL via INTRAVENOUS

## 2013-09-06 MED ORDER — DEXTROSE 50 % IV SOLN: 25.0000 mL | INTRAVENOUS | Status: DC | PRN

## 2013-09-06 MED ORDER — ACETAMINOPHEN 650 MG RE SUPP: 650.0000 mg | Freq: Four times a day (QID) | RECTAL | Status: DC | PRN

## 2013-09-06 NOTE — ED Notes (Signed)
Pt reports vomiting and abd cramping since this am. Hx of dm and reports cbg critical high pta.

## 2013-09-06 NOTE — Progress Notes (Signed)
Report received from Kathy, RN.

## 2013-09-06 NOTE — H&P (Signed)
[facility] History and Physical  Beverly Wells ZOX:[id number] DOB: 01/15/1991 DOA: 09/06/2013  Referring physician: Dr. Effie ShyWentz PCP: No PCP Per Patient   Chief Complaint: nausea and vomiting  HPI: Beverly CaseyLenneek Marie Wells is a 23 y.o. female  23 year old with past medical history of diabetes type 1 comes in for abdominal pain nausea and vomiting. She relates she missed her insulin this morning as she wasn't feeling too well and was not eating too well. She relates no fever cough chillsor diarrhea. As her abdominal pain was bothering her  (she currently has her period) and she was nauseated and not able to she did not take her insulin this morning.  In the ED: A complete metabolic panel was done that shows an anion gap of 21 a bicarbonate of 19. Her CBC is within normal limits except for an elevated ANC. She was started on insulin and glucose stabilizer.   Review of Systems:  Constitutional:  No weight loss, night sweats, Fevers, chills, fatigue.  HEENT:  No headaches, Difficulty swallowing,Tooth/dental problems,Sore throat,  No sneezing, itching, ear ache, nasal congestion, post nasal drip,  Cardio-vascular:  No chest pain, Orthopnea, PND, swelling in lower extremities, anasarca, dizziness, palpitations  GI:  No heartburn, indigestion, , diarrhea, change in bowel habits, loss of appetite  Resp:  No shortness of breath with exertion or at rest. No excess mucus, no productive cough, No non-productive cough, No coughing up of blood.No change in color of mucus.No wheezing.No chest wall deformity  Skin:  no rash or lesions.  GU:  no dysuria, change in color of urine, no urgency or frequency. No flank pain.  Musculoskeletal:  No joint pain or swelling. No decreased range of motion. No back pain.  Psych:  No change in mood or affect. No depression or anxiety. No memory loss.   Past Medical History  Diagnosis Date  . 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 PERS HX NONCOMPLIANCE W/MED TX PRS  HAZARDS HLTH 08/24/2010  . ALLERGIC RHINITIS 05/07/2010  . DKA (diabetic ketoacidoses)   . Diabetes mellitus without complication    Past Surgical History  Procedure Laterality Date  . Tonsillectomy     Social History:  reports that she has never smoked. She does not have any smokeless tobacco history on file. She reports that she does not drink alcohol or use illicit drugs.  Allergies  Allergen Reactions  . Penicillins Anaphylaxis  . Banana Hives    Peas and cauliflower  . Orange Concentrate [Flavoring Agent] Hives  . Penicillins Swelling    hives    Family History  Problem Relation Age of Onset  . Diabetes Neg Hx   . Other Mother   . Other Father      Prior to Admission medications   Medication Sig Start Date End Date Taking? Authorizing Provider  acetaminophen (TYLENOL) 500 MG tablet Take 1,000 mg by mouth daily as needed for moderate pain.   Yes Historical Provider, MD  cetirizine (ZYRTEC) 10 MG tablet Take 10 mg by mouth daily.     Yes Historical Provider, MD  insulin aspart protamine- aspart (NOVOLOG MIX 70/30) (70-30) 100 UNIT/ML injection Inject 40 Units into the skin 2 (two) times daily with a meal.    Yes Historical Provider, MD   Physical Exam: Filed Vitals:   09/06/13 1745  BP: 105/55  Pulse: 87  Temp:   Resp:     BP 105/55  Pulse 87  Temp(Src) 98.1 F (36.7 C) (Oral)  Resp 18  Ht 5\' 7"  (  1.702 m)  Wt 69.4 kg (153 lb)  BMI 23.96 kg/m2  SpO2 100%  LMP 08/12/2013  General:  Appears calm and comfortable Eyes: PERRL, normal lids, irises & conjunctiva ENT: grossly normal hearing, lips & tongue Neck: no LAD, masses or thyromegaly Cardiovascular: RRR, no m/r/g. No LE edema. Telemetry: SR, no arrhythmias  Respiratory: CTA bilaterally, no w/r/r. Normal respiratory effort. Abdomen: soft, ntnd Skin: no rash or induration seen on limited exam Musculoskeletal: grossly normal tone BUE/BLE Psychiatric: grossly normal mood and affect, speech fluent and  appropriate Neurologic: grossly non-focal.          Labs on Admission:  Basic Metabolic Panel:  Recent Labs Lab 09/06/13 1530  NA 136*  K 4.3  CL 96  CO2 19  GLUCOSE 398*  BUN 11  CREATININE 0.59  CALCIUM 9.6   Liver Function Tests:  Recent Labs Lab 09/06/13 1530  AST 59*  ALT 30  ALKPHOS 88  BILITOT 0.6  PROT 8.4*  ALBUMIN 4.4    Recent Labs Lab 09/06/13 1530  LIPASE 11   No results found for this basename: AMMONIA,  in the last 168 hours CBC:  Recent Labs Lab 09/06/13 1530  WBC 9.4  NEUTROABS 7.8*  HGB 13.0  HCT 39.5  MCV 71.6*  PLT 266   Cardiac Enzymes: No results found for this basename: CKTOTAL, CKMB, CKMBINDEX, TROPONINI,  in the last 168 hours  BNP (last 3 results) No results found for this basename: PROBNP,  in the last 8760 hours CBG:  Recent Labs Lab 09/06/13 1540 09/06/13 1732  GLUCAP 382* 347*    Radiological Exams on Admission: No results found.  EKG: Independently reviewed. none  Assessment/Plan  DKA (diabetic ketoacidoses) - Start IV insulin per glucose stabilizer. - Check a pregnancy test and UA. - She has a history of noncompliance the most likely causes noncompliance. - She denies any past chest pain, shortness of breath or fevers.  Code Status: full Family Communication: none Disposition Plan: inpatient  Time spent: 71 miuntes  FELIZ Rosine Beat [facility] Pager [phone]

## 2013-09-06 NOTE — ED Provider Notes (Signed)
CSN: [id number]     Arrival date & time 09/06/13  1513 History   First MD Initiated Contact with Patient 09/06/13 1610     Chief Complaint  Patient presents with  . Emesis   (Consider location/radiation/quality/duration/timing/severity/associated sxs/prior Treatment) Patient is a 23 y.o. female presenting with vomiting. The history is provided by the patient. No language interpreter was used.  Emesis Severity:  Severe Duration:  1 day Timing:  Constant Associated symptoms: abdominal pain   Associated symptoms: no chills and no diarrhea   Associated symptoms comment:  She reports normal asymptomatic day yesterday. This morning she started having nausea with vomiting that has persisted through the day. No fever. She complains of severe cramping abdominal pain. She denies dysuria or diarrhea. She states she has been taking her insulin as prescribed, but has a history of DKA which is similar to present symptoms.    Past Medical History  Diagnosis Date  . 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 ALLERGIC RHINITIS 05/07/2010  . DKA (diabetic ketoacidoses)   . Diabetes mellitus without complication    Past Surgical History  Procedure Laterality Date  . Tonsillectomy     Family History  Problem Relation Age of Onset  . Diabetes Neg Hx    History  Substance Use Topics  . Smoking status: Never Smoker   . Smokeless tobacco: Not on file  . Alcohol Use: No   OB History   Grav Para Term Preterm Abortions TAB SAB Ect Mult Living                 Review of Systems  Constitutional: Negative for fever and chills.  Respiratory: Negative.  Negative for cough and shortness of breath.   Cardiovascular: Negative.   Gastrointestinal: Positive for nausea, vomiting and abdominal pain. Negative for diarrhea.  Genitourinary: Negative.   Musculoskeletal: Negative.   Skin: Negative.   Neurological: Negative.     Allergies  Penicillins; Banana; Orange  concentrate; and Penicillins  Home Medications   Current Outpatient Rx  Name  Route  Sig  Dispense  Refill  . acetaminophen (TYLENOL) 500 MG tablet   Oral   Take 1,000 mg by mouth daily as needed for moderate pain.         . cetirizine (ZYRTEC) 10 MG tablet   Oral   Take 10 mg by mouth daily.           . insulin aspart protamine- aspart (NOVOLOG MIX 70/30) (70-30) 100 UNIT/ML injection   Subcutaneous   Inject 40 Units into the skin 2 (two) times daily with a meal.           BP 135/84  Pulse 111  Temp(Src) 98.1 F (36.7 C) (Oral)  Resp 18  SpO2 99%  LMP 08/12/2013 Physical Exam  Constitutional: She is oriented to person, place, and time. She appears well-developed and well-nourished.  Uncomfortable appearing.   HENT:  Head: Normocephalic.  Neck: Normal range of motion. Neck supple.  Cardiovascular: Normal rate and regular rhythm.   Pulmonary/Chest: Effort normal and breath sounds normal.  Abdominal: Soft. Bowel sounds are normal. There is tenderness. There is no rebound and no guarding.  Diffuse abdominal tenderness.   Musculoskeletal: Normal range of motion.  Neurological: She is alert and oriented to person, place, and time.  Skin: Skin is warm and dry. No rash noted.  Psychiatric: She has a normal mood and affect.    ED Course  Procedures (  including critical care time) Labs Review Labs Reviewed  CBC WITH DIFFERENTIAL - Abnormal; Notable for the following:    RBC 5.52 (*)    MCV 71.6 (*)    MCH 23.6 (*)    Neutrophils Relative % 83 (*)    Neutro Abs 7.8 (*)    All other components within normal limits  COMPREHENSIVE METABOLIC PANEL - Abnormal; Notable for the following:    Sodium 136 (*)    Glucose, Bld 398 (*)    Total Protein 8.4 (*)    AST 59 (*)    All other components within normal limits  GLUCOSE, CAPILLARY - Abnormal; Notable for the following:    Glucose-Capillary 382 (*)    All other components within normal limits  LIPASE, BLOOD   URINALYSIS, ROUTINE W REFLEX MICROSCOPIC   Imaging Review No results found.  EKG Interpretation   None       MDM  No diagnosis found. 1. DKA  Patient is Type 1 DM with history of DKA presents with hyperglycemia and anion gap of 26. CO2 19, borderline. Thought early DKA in a brittle diabetic. Admit to [facility].     Arnoldo Hooker, PA-C 09/07/13 2100

## 2013-09-06 NOTE — ED Notes (Signed)
Admitting MD at bedside.

## 2013-09-06 NOTE — ED Notes (Signed)
Pt called in main ED waiting area with no response 

## 2013-09-07 DIAGNOSIS — K047 Periapical abscess without sinus: Secondary | ICD-10-CM

## 2013-09-07 DIAGNOSIS — D649 Anemia, unspecified: Secondary | ICD-10-CM | POA: Diagnosis present

## 2013-09-07 DIAGNOSIS — E1110 Type 2 diabetes mellitus with ketoacidosis without coma: Secondary | ICD-10-CM

## 2013-09-07 LAB — BASIC METABOLIC PANEL
BUN: 10 mg/dL (ref 6–23)
CHLORIDE: 107 meq/L (ref 96–112)
CO2: 20 meq/L (ref 19–32)
Calcium: 8 mg/dL — ABNORMAL LOW (ref 8.4–10.5)
Creatinine, Ser: 0.56 mg/dL (ref 0.50–1.10)
GFR calc Af Amer: >90 mL/min (ref >90)
GLUCOSE: 225 mg/dL — AB (ref 70–99)
POTASSIUM: 3.9 meq/L (ref 3.7–5.3)
SODIUM: 139 meq/L (ref 137–147)

## 2013-09-07 LAB — COMPREHENSIVE METABOLIC PANEL
ALT: 15 U/L (ref 0–35)
AST: 22 U/L (ref 0–37)
Albumin: 2.7 g/dL — ABNORMAL LOW (ref 3.5–5.2)
Alkaline Phosphatase: 62 U/L (ref 39–117)
BUN: 11 mg/dL (ref 6–23)
CO2: 20 meq/L (ref 19–32)
Calcium: 8.1 mg/dL — ABNORMAL LOW (ref 8.4–10.5)
Chloride: 110 mEq/L (ref 96–112)
Creatinine, Ser: 0.49 mg/dL — ABNORMAL LOW (ref 0.50–1.10)
GFR calc Af Amer: >90 mL/min (ref >90)
Glucose, Bld: 111 mg/dL — ABNORMAL HIGH (ref 70–99)
Potassium: 3.8 mEq/L (ref 3.7–5.3)
Sodium: 141 mEq/L (ref 137–147)
Total Bilirubin: <0.2 mg/dL — ABNORMAL LOW (ref 0.3–1.2)
Total Protein: 5.5 g/dL — ABNORMAL LOW (ref 6.0–8.3)

## 2013-09-07 LAB — CBC
HCT: 30.2 % — ABNORMAL LOW (ref 36.0–46.0)
Hemoglobin: 9.9 g/dL — ABNORMAL LOW (ref 12.0–15.0)
MCH: 23.4 pg — AB (ref 26.0–34.0)
MCHC: 32.8 g/dL (ref 30.0–36.0)
MCV: 71.4 fL — ABNORMAL LOW (ref 78.0–100.0)
Platelets: 200 10*3/uL (ref 150–400)
RBC: 4.23 MIL/uL (ref 3.87–5.11)
RDW: 14.9 % (ref 11.5–15.5)
WBC: 5.4 10*3/uL (ref 4.0–10.5)

## 2013-09-07 LAB — GLUCOSE, CAPILLARY
GLUCOSE-CAPILLARY: 141 mg/dL — AB (ref 70–99)
GLUCOSE-CAPILLARY: 156 mg/dL — AB (ref 70–99)
Glucose-Capillary: 147 mg/dL — ABNORMAL HIGH (ref 70–99)
Glucose-Capillary: 114 mg/dL — ABNORMAL HIGH (ref 70–99)
Glucose-Capillary: 193 mg/dL — ABNORMAL HIGH (ref 70–99)

## 2013-09-07 MED ORDER — INSULIN GLARGINE 100 UNIT/ML ~~LOC~~ SOLN
10.0000 [IU] | Freq: Every day | SUBCUTANEOUS | Status: DC
  Filled 2013-09-07: qty 0.1

## 2013-09-07 MED ORDER — INSULIN ASPART 100 UNIT/ML ~~LOC~~ SOLN
3.0000 [IU] | Freq: Three times a day (TID) | SUBCUTANEOUS | Status: DC
  Administered 2013-09-07: 3 [IU] via SUBCUTANEOUS

## 2013-09-07 MED ORDER — INSULIN GLARGINE 100 UNIT/ML ~~LOC~~ SOLN
10.0000 [IU] | Freq: Once | SUBCUTANEOUS | Status: AC
  Administered 2013-09-07: 10 [IU] via SUBCUTANEOUS
  Filled 2013-09-07: qty 0.1

## 2013-09-07 MED ORDER — INSULIN ASPART 100 UNIT/ML ~~LOC~~ SOLN: 0.0000 [IU] | Freq: Every day | SUBCUTANEOUS | Status: DC

## 2013-09-07 MED ORDER — INSULIN ASPART 100 UNIT/ML ~~LOC~~ SOLN
0.0000 [IU] | Freq: Three times a day (TID) | SUBCUTANEOUS | Status: DC
  Administered 2013-09-07: 3 [IU] via SUBCUTANEOUS

## 2013-09-07 MED ORDER — INSULIN ASPART PROT & ASPART (70-30 MIX) 100 UNIT/ML ~~LOC~~ SUSP: 40.0000 [IU] | Freq: Two times a day (BID) | SUBCUTANEOUS | Status: DC

## 2013-09-07 MED ORDER — INSULIN ASPART PROT & ASPART (70-30 MIX) 100 UNIT/ML PEN: 40.0000 [IU] | PEN_INJECTOR | Freq: Two times a day (BID) | SUBCUTANEOUS | Status: DC

## 2013-09-07 NOTE — Discharge Summary (Signed)
Physician Discharge Summary  Dimple CaseyLenneek Marie Appleton ZOX:[id number] DOB: 11/08/1990 DOA: 09/06/2013  PCP: Geraldo PitterBLAND,VEITA J, MD  Admit date: 09/06/2013 Discharge date: 09/07/2013  Time spent: 50 minutes  Recommendations for Outpatient Follow-up:  1. CBC in 1 week.  Patient with anemia likely from heavy periods.  Stable for outpatient work up/treatment  2. Please resume regular home insulin regimen.   Discharge Diagnoses:  Active Problems:   DKA (diabetic ketoacidoses)   Anemia  DKA (diabetic ketoacidoses)  -Patient reports feeling ill yesterday and did not take her insulin. -Anion Gap (21), bicarb 19. -DKA now Resolved; Bicarb = 20 and AG = 11 this morning (1/23).   -Nausea and Vomiting have resolved -Have resumed home insulin regimen.  -Has been educated on importance of adhering to insulin regimen and checking CBGs daily as well as importance of f/u with PCP for insulin regimen optimization.   Anemia:  -Likely secondary to menstruation.  -Per patient, has menorrhagia  -Hgb 9.9 on 1/22  -Stable and ready for outpatient f/u  Discharge Condition: Stable for d/c to home   Diet recommendation: Diabetic Diet  Filed Weights   09/06/13 1729 09/06/13 2025  Weight: 69.4 kg (153 lb) 69.945 kg (154 lb 3.2 oz)    History of present illness:  23 year old with past medical history of diabetes type 1 comes in for abdominal pain nausea and vomiting. She stated that she missed her insulin on morning of 01.22, as she wasn't feeling too well and was not eating too well. She attributed her abdominal pain and vomiting to her menstruation. Pt was admitted for DKA and started on DKA protocol.    Hospital Course:  DKA [name] was initially placed on the glucose stabilizer and IV fluids.  Her glucose and bmet quickly normalized and she was progress to SQ insulin.  This morning (1/23) [name] has no complaints. Educated on the importance of adhering to regular home insulin regimen and checking  CBGs daily as well as importance of f/u with PCP for insulin regimen optimization. Also discussed importance of regular appointments with PCP.   Anemia Patient's hgb dropped from 13 to 9.9. Likely a component of dehydration and dilution Patient has heavy periods - may benefit from hormonal birth control.  Procedures:  None   Consultations:  None   Discharge Exam: Filed Vitals:   09/07/13 0408  BP: 90/52  Pulse: 82  Temp: 98.8 F (37.1 C)  Resp: 18   General: Appears calm and comfortable, NAD.  Eyes: PERRL, normal lids, irises & conjunctiva  Cardiovascular: S1 and S2 with a RRR and no murmurs, rubs, or gallops.  Respiratory: CTA bilaterally, no w/r/r. Normal respiratory effort.  Abdomen: soft, non tender, non distended, +BS Skin: no rash or induration seen on limited exam  Extremities: No edema; warm to touch.  Psychiatric: grossly normal mood and affect, speech fluent and appropriate  Neurologic: grossly non-focal.     Discharge Instructions      Discharge Orders   Future Orders Complete By Expires   Call MD for:  persistant dizziness or light-headedness  As directed    Call MD for:  persistant nausea and vomiting  As directed    Call MD for:  temperature >100.4  As directed    Diet - low sodium heart healthy  As directed    Increase activity slowly  As directed        Medication List    STOP taking these medications       insulin aspart  protamine- aspart (70-30) 100 UNIT/ML injection  Commonly known as:  NOVOLOG MIX 70/30  Replaced by:  Insulin Aspart Prot & Aspart (70-30) 100 UNIT/ML Pen      TAKE these medications       acetaminophen 500 MG tablet  Commonly known as:  TYLENOL  Take 1,000 mg by mouth daily as needed for moderate pain.     cetirizine 10 MG tablet  Commonly known as:  ZYRTEC  Take 10 mg by mouth daily.     Insulin Aspart Prot & Aspart (70-30) 100 UNIT/ML Pen  Commonly known as:  NOVOLOG MIX 70/30 FLEXPEN  Inject 40 Units into the  skin 2 (two) times daily.       Allergies  Allergen Reactions  . Penicillins Anaphylaxis  . Banana Hives    Peas and cauliflower  . Orange Concentrate [Flavoring Agent] Hives  . Penicillins Swelling    hives   Follow-up Information   Follow up with Geraldo Pitter, MD. Schedule an appointment as soon as possible for a visit in 1 week.   Specialty:  Family Medicine   Contact information:   [address] [place] [address] [phone]       Schedule an appointment as soon as possible for a visit to follow up.       The results of significant diagnostics from this hospitalization (including imaging, microbiology, ancillary and laboratory) are listed below for reference.     Labs: Basic Metabolic Panel:  Recent Labs Lab 09/06/13 1530 09/07/13 0518 09/07/13 0820  NA 136* 141 139  K 4.3 3.8 3.9  CL 96 110 107  CO2 19 20 20   GLUCOSE 398* 111* 225*  BUN 11 11 10   CREATININE 0.59 0.49* 0.56  CALCIUM 9.6 8.1* 8.0*   Liver Function Tests:  Recent Labs Lab 09/06/13 1530 09/07/13 0518  AST 59* 22  ALT 30 15  ALKPHOS 88 62  BILITOT 0.6 <0.2*  PROT 8.4* 5.5*  ALBUMIN 4.4 2.7*    Recent Labs Lab 09/06/13 1530  LIPASE 11   CBC:  Recent Labs Lab 09/06/13 1530 09/07/13 0518  WBC 9.4 5.4  NEUTROABS 7.8*  --   HGB 13.0 9.9*  HCT 39.5 30.2*  MCV 71.6* 71.4*  PLT 266 200    CBG:  Recent Labs Lab 09/07/13 0053 09/07/13 0151 09/07/13 0252 09/07/13 0353 09/07/13 0813  GLUCAP 156* 147* 141* 114* 193*     Signed:  Bonnye Fava, PA-C  [facility] 09/07/2013, 3:51 PM

## 2013-09-07 NOTE — Care Management Note (Signed)
    Page 1 of 1   09/07/2013     5:35:50 PM   CARE MANAGEMENT NOTE 09/07/2013  Patient:  Beverly Wells,Beverly Wells   Account Number:  [id number]  Date Initiated:  09/07/2013  Documentation initiated by:  Letha CapeAYLOR,Karriem Muench  Subjective/Objective Assessment:   dx dka  admit- from home.     Action/Plan:   Anticipated DC Date:  09/07/2013   Anticipated DC Plan:  HOME/SELF CARE      DC Planning Services  CM consult      Choice offered to / List presented to:             Status of service:  Completed, signed off Medicare Important Message given?   (If response is "NO", the following Medicare IM given date fields will be blank) Date Medicare IM given:   Date Additional Medicare IM given:    Discharge Disposition:  HOME/SELF CARE  Per UR Regulation:  Reviewed for med. necessity/level of care/duration of stay  If discussed at Long Length of Stay Meetings, dates discussed:    Comments:

## 2013-09-07 NOTE — Progress Notes (Signed)
[name] to be D/C'd Home per MD order.  Discussed with the patient and all questions fully answered.    Medication List    STOP taking these medications       insulin aspart protamine- aspart (70-30) 100 UNIT/ML injection  Commonly known as:  NOVOLOG MIX 70/30  Replaced by:  Insulin Aspart Prot & Aspart (70-30) 100 UNIT/ML Pen      TAKE these medications       acetaminophen 500 MG tablet  Commonly known as:  TYLENOL  Take 1,000 mg by mouth daily as needed for moderate pain.     cetirizine 10 MG tablet  Commonly known as:  ZYRTEC  Take 10 mg by mouth daily.     Insulin Aspart Prot & Aspart (70-30) 100 UNIT/ML Pen  Commonly known as:  NOVOLOG MIX 70/30 FLEXPEN  Inject 40 Units into the skin 2 (two) times daily.        VVS, Skin clean, dry and intact without evidence of skin break down, no evidence of skin tears noted. IV catheter discontinued intact. Site without signs and symptoms of complications. Dressing and pressure applied.  An After Visit Summary was printed and given to the patient. Patient escorted via WC, and D/C home via private auto.  Driggers, Rae RoamCortney Elizabeth 09/07/2013 11:47 AM

## 2013-09-07 NOTE — Progress Notes (Signed)
Per MD okay to discontinue  Insulin drip without giving basal novolog. Will d/c insulin drip

## 2013-09-07 NOTE — Discharge Summary (Signed)
Addendum  Patient seen and examined, chart and data base reviewed.  I agree with the above assessment and plan.  For full details please see Mrs. Algis DownsMarianne York PA note.  Hyperglycemia was place on the glucostabilizer protocol, missed her yesterday's dose of 70/30 insulin mix.  Followup with primary care physician.  Clint LippsMutaz A Hanya Guerin, MD [facility] Pager: [phone] 09/07/2013, 5:59 PM

## 2013-09-08 NOTE — ED Provider Notes (Signed)
Medical screening examination/treatment/procedure(s) were performed by non-physician practitioner and as supervising physician I was immediately available for consultation/collaboration.  EKG Interpretation    Date/Time:    Ventricular Rate:    PR Interval:    QRS Duration:   QT Interval:    QTC Calculation:   R Axis:     Text Interpretation:               Flint MelterElliott L Damontae Loppnow, MD 09/08/13 1550

## 2013-10-26 IMAGING — CR DG CHEST 1V PORT
1 series · 1 of 1 positions shown · non-contrast
Comparison: 06/03/2011 chest radiograph

CLINICAL DATA: Shortness of breath and pain.

PORTABLE CHEST - 1 VIEW

[AP]
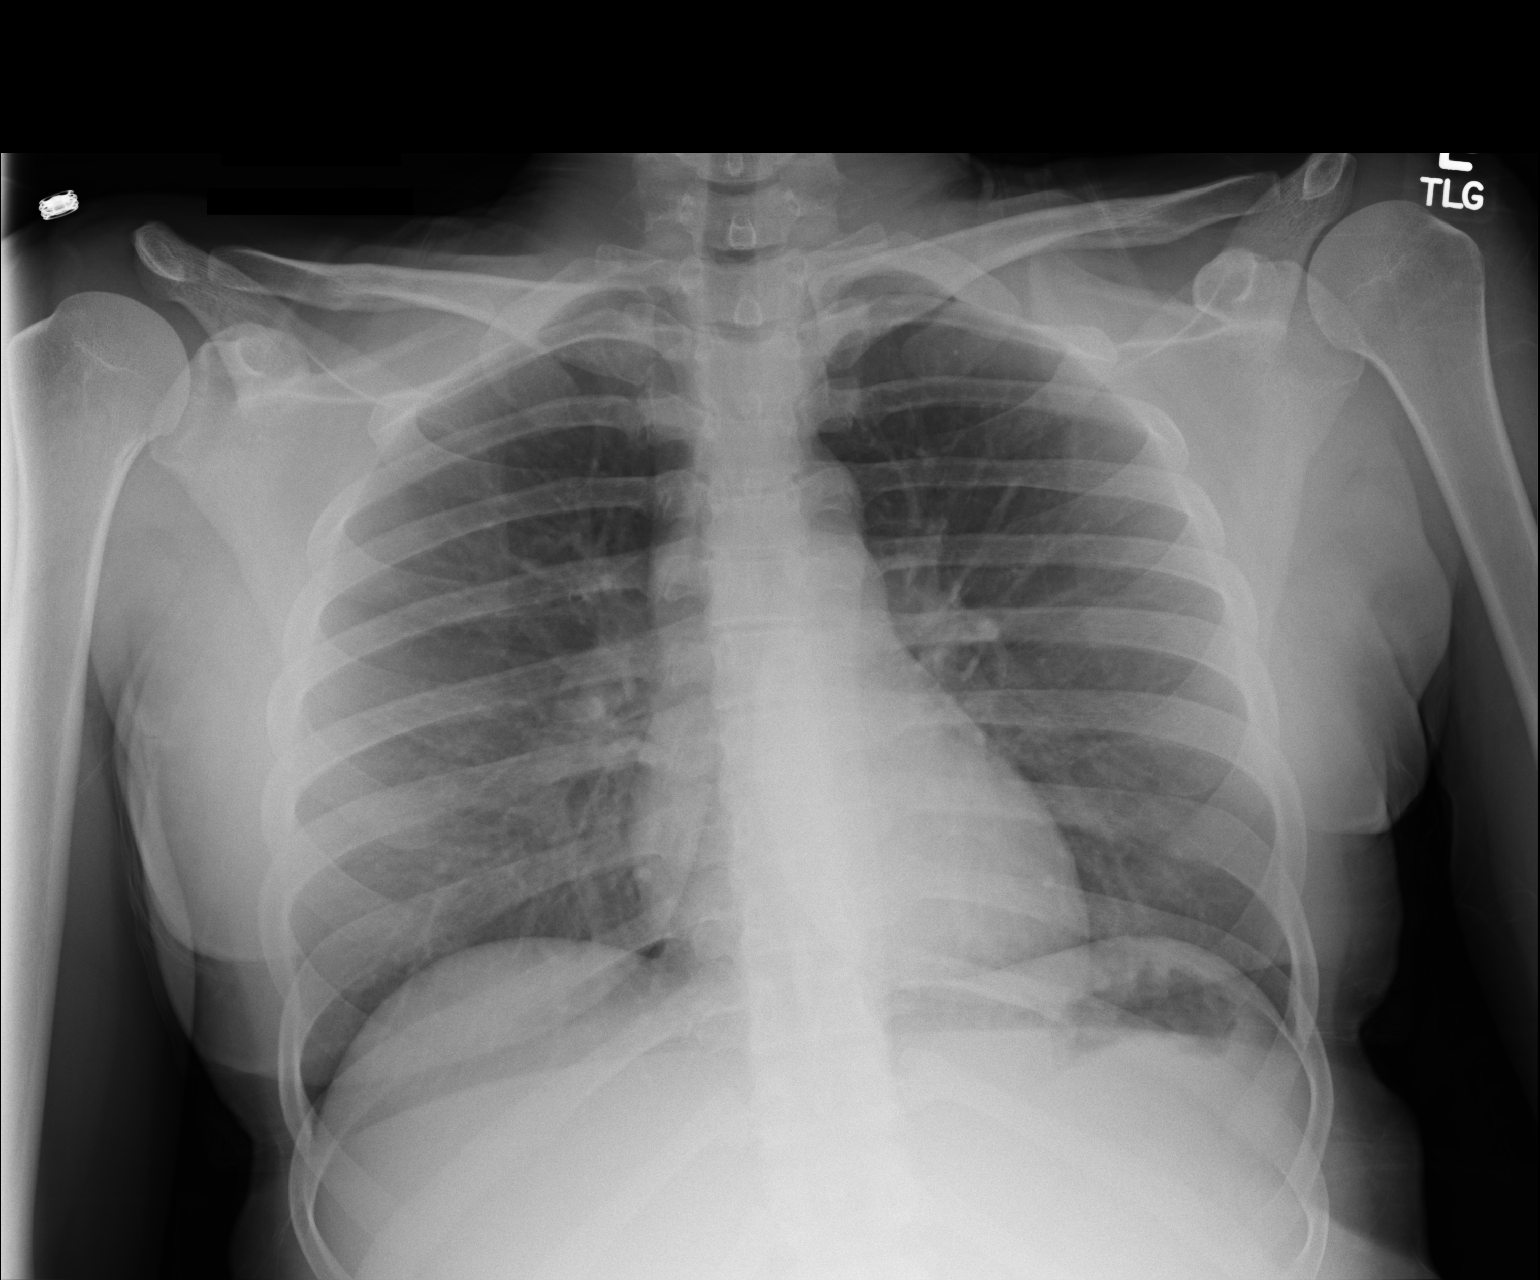

[1 of 1 positions shown; findings below may reference images not displayed]

FINDINGS: The cardiomediastinal silhouette is unremarkable.
The lungs are clear.
There is no evidence of focal airspace disease, pulmonary edema,
suspicious pulmonary nodule/mass, pleural effusion, or
pneumothorax.
No acute bony abnormalities are identified.
IMPRESSION: No evidence of active cardiopulmonary disease.

## 2013-11-18 ENCOUNTER — Emergency Department (HOSPITAL_COMMUNITY)
Admission: EM | Admit: 2013-11-18 | Discharge: 2013-11-18 | Disposition: A | Discharge status: Home / Self Care | Payer: Medicaid Other | Attending: Emergency Medicine | Admitting: Emergency Medicine

## 2013-11-18 ENCOUNTER — Encounter (HOSPITAL_COMMUNITY): Payer: Self-pay | Admitting: Emergency Medicine

## 2013-11-18 DIAGNOSIS — E119 Type 2 diabetes mellitus without complications: Secondary | ICD-10-CM | POA: Insufficient documentation

## 2013-11-18 DIAGNOSIS — Z794 Long term (current) use of insulin: Secondary | ICD-10-CM | POA: Insufficient documentation

## 2013-11-18 DIAGNOSIS — Z792 Long term (current) use of antibiotics: Secondary | ICD-10-CM | POA: Insufficient documentation

## 2013-11-18 DIAGNOSIS — J069 Acute upper respiratory infection, unspecified: Secondary | ICD-10-CM | POA: Insufficient documentation

## 2013-11-18 DIAGNOSIS — Z880 Allergy status to penicillin: Secondary | ICD-10-CM | POA: Insufficient documentation

## 2013-11-18 DIAGNOSIS — Z9089 Acquired absence of other organs: Secondary | ICD-10-CM | POA: Insufficient documentation

## 2013-11-18 DIAGNOSIS — Z79899 Other long term (current) drug therapy: Secondary | ICD-10-CM | POA: Insufficient documentation

## 2013-11-18 DIAGNOSIS — Z9119 Patient's noncompliance with other medical treatment and regimen: Secondary | ICD-10-CM | POA: Insufficient documentation

## 2013-11-18 DIAGNOSIS — Z91199 Patient's noncompliance with other medical treatment and regimen due to unspecified reason: Secondary | ICD-10-CM | POA: Insufficient documentation

## 2013-11-18 MED ORDER — AZITHROMYCIN 250 MG PO TABS: 250.0000 mg | ORAL_TABLET | Freq: Every day | ORAL | Status: DC

## 2013-11-18 MED ORDER — BENZONATATE 100 MG PO CAPS: 100.0000 mg | ORAL_CAPSULE | Freq: Three times a day (TID) | ORAL | Status: DC

## 2013-11-18 NOTE — ED Provider Notes (Signed)
CSN: [id number]     Arrival date & time 11/18/13  0414 History   First MD Initiated Contact with Patient 11/18/13 [phone]     Chief Complaint  Patient presents with  . Cough     (Consider location/radiation/quality/duration/timing/severity/associated sxs/prior Treatment) HPI Comments: 23 year old female, history of diabetes, presents with a complaint of sore throat cough and a mild she has nasal congestion and feels as if she is coughing up yellow phlegm. She has used Mucinex prior to arrival with only mild improvement. She has no fevers chills nausea vomiting abdominal pain back pain dysuria diarrhea or rashes. Symptoms are persistent, gradually worsening.  Patient is a 23 y.o. female presenting with cough. The history is provided by the patient.  Cough   Past Medical History  Diagnosis Date  . 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 ALLERGIC RHINITIS 05/07/2010  . DKA (diabetic ketoacidoses)   . Diabetes mellitus without complication    Past Surgical History  Procedure Laterality Date  . Tonsillectomy     Family History  Problem Relation Age of Onset  . Diabetes Neg Hx   . Other Mother   . Other Father    History  Substance Use Topics  . Smoking status: Never Smoker   . Smokeless tobacco: Not on file  . Alcohol Use: No   OB History   Grav Para Term Preterm Abortions TAB SAB Ect Mult Living                 Review of Systems  Respiratory: Positive for cough.   All other systems reviewed and are negative.      Allergies  Penicillins; Banana; Orange concentrate; and Penicillins  Home Medications   Current Outpatient Rx  Name  Route  Sig  Dispense  Refill  . acetaminophen (TYLENOL) 500 MG tablet   Oral   Take 1,000 mg by mouth daily as needed for moderate pain.         . cetirizine (ZYRTEC) 10 MG tablet   Oral   Take 10 mg by mouth daily.           . Insulin Aspart Prot & Aspart (NOVOLOG MIX 70/30 FLEXPEN) (70-30)  100 UNIT/ML Pen   Subcutaneous   Inject 40 Units into the skin 2 (two) times daily.   60 mL   11   . azithromycin (ZITHROMAX Z-PAK) 250 MG tablet   Oral   Take 1 tablet (250 mg total) by mouth daily. 500mg  PO day 1, then 250mg  PO days 205   6 tablet   0   . benzonatate (TESSALON) 100 MG capsule   Oral   Take 1 capsule (100 mg total) by mouth every 8 (eight) hours.   21 capsule   0    BP 95/76  Pulse 104  Temp(Src) 98.6 F (37 C) (Oral)  Resp 18  SpO2 100%  LMP 10/28/2013 Physical Exam  Nursing note and vitals reviewed. Constitutional: She appears well-developed and well-nourished. No distress.  HENT:  Head: Normocephalic and atraumatic.  Mouth/Throat: Oropharynx is clear and moist. No oropharyngeal exudate.  Tympanic membranes clear bilaterally, oropharynx is clear and moist, nasal passages are clear without any discharge and there is no sinus tenderness.  Eyes: Conjunctivae and EOM are normal. Pupils are equal, round, and reactive to light. Right eye exhibits no discharge. Left eye exhibits no discharge. No scleral icterus.  Neck: Normal range of motion. Neck supple. No JVD present. No  thyromegaly present.  Cardiovascular: Normal rate, regular rhythm, normal heart sounds and intact distal pulses.  Exam reveals no gallop and no friction rub.   No murmur heard. No tachycardia on my exam  Pulmonary/Chest: Effort normal. No respiratory distress. She has no wheezes. She has no rales.  Rhonchorous sounds on deep breathing, no rales, no wheezing, no increased work of breathing and no accessory muscle use. Speaks in full sentences  Abdominal: Soft. Bowel sounds are normal. She exhibits no distension and no mass. There is no tenderness.  Musculoskeletal: Normal range of motion. She exhibits no edema and no tenderness.  Lymphadenopathy:    She has no cervical adenopathy.  Neurological: She is alert. Coordination normal.  Skin: Skin is warm and dry. No rash noted. No erythema.   Psychiatric: She has a normal mood and affect. Her behavior is normal.    ED Course  Procedures (including critical care time) Labs Review Labs Reviewed - No data to display Imaging Review No results found.    MDM   Final diagnoses:  URI (upper respiratory infection)    The patient has vital signs which are overall unremarkable, she appears stable for discharge, has upper respiratory infection, no indication for x-rays or other imaging or interventional modalities. Will be discharged with the following medications.  Patient expresses her understanding to the indications for return.   Meds given in ED:  Medications - No data to display  New Prescriptions   AZITHROMYCIN (ZITHROMAX Z-PAK) 250 MG TABLET    Take 1 tablet (250 mg total) by mouth daily. 500mg  PO day 1, then 250mg  PO days 205   BENZONATATE (TESSALON) 100 MG CAPSULE    Take 1 capsule (100 mg total) by mouth every 8 (eight) hours.     Vida Roller, MD 11/18/13 [phone]

## 2013-11-18 NOTE — ED Notes (Signed)
Pt. reports productive cough , chest congestion , runny nose/nasal congestion , body aches and chills onset yesterday .

## 2013-11-18 NOTE — Discharge Instructions (Signed)
Please call your doctor for a followup appointment within 24-48 hours. When you talk to your doctor please let them know that you were seen in the emergency department and have them acquire all of your records so that they can discuss the findings with you and formulate a treatment plan to fully care for your new and ongoing problems. ° °

## 2013-11-25 ENCOUNTER — Emergency Department (HOSPITAL_COMMUNITY)
Admission: EM | Admit: 2013-11-25 | Discharge: 2013-11-26 | Disposition: A | Discharge status: Home / Self Care | Payer: Medicaid Other | Attending: Emergency Medicine | Admitting: Emergency Medicine

## 2013-11-25 ENCOUNTER — Encounter (HOSPITAL_COMMUNITY): Payer: Self-pay | Admitting: Emergency Medicine

## 2013-11-25 DIAGNOSIS — R739 Hyperglycemia, unspecified: Secondary | ICD-10-CM

## 2013-11-25 DIAGNOSIS — E1110 Type 2 diabetes mellitus with ketoacidosis without coma: Secondary | ICD-10-CM | POA: Insufficient documentation

## 2013-11-25 DIAGNOSIS — Z794 Long term (current) use of insulin: Secondary | ICD-10-CM | POA: Insufficient documentation

## 2013-11-25 DIAGNOSIS — Z3202 Encounter for pregnancy test, result negative: Secondary | ICD-10-CM | POA: Insufficient documentation

## 2013-11-25 DIAGNOSIS — N946 Dysmenorrhea, unspecified: Secondary | ICD-10-CM

## 2013-11-25 DIAGNOSIS — R110 Nausea: Secondary | ICD-10-CM | POA: Insufficient documentation

## 2013-11-25 DIAGNOSIS — Z880 Allergy status to penicillin: Secondary | ICD-10-CM | POA: Insufficient documentation

## 2013-11-25 DIAGNOSIS — Z8709 Personal history of other diseases of the respiratory system: Secondary | ICD-10-CM | POA: Insufficient documentation

## 2013-11-25 DIAGNOSIS — Z79899 Other long term (current) drug therapy: Secondary | ICD-10-CM | POA: Insufficient documentation

## 2013-11-25 LAB — I-STAT VENOUS BLOOD GAS, ED
Acid-Base Excess: 1 mmol/L (ref 0.0–2.0)
Bicarbonate: 26.4 mEq/L — ABNORMAL HIGH (ref 20.0–24.0)
O2 Saturation: 63 %
PCO2 VEN: 42.1 mmHg — AB (ref 45.0–50.0)
PH VEN: 7.405 — AB (ref 7.250–7.300)
TCO2: 28 mmol/L (ref 0–100)
pO2, Ven: 33 mmHg (ref 30.0–45.0)

## 2013-11-25 LAB — COMPREHENSIVE METABOLIC PANEL
ALBUMIN: 3.8 g/dL (ref 3.5–5.2)
ALK PHOS: 100 U/L (ref 39–117)
ALT: 16 U/L (ref 0–35)
AST: 40 U/L — AB (ref 0–37)
BUN: 9 mg/dL (ref 6–23)
CO2: 23 meq/L (ref 19–32)
Calcium: 9.4 mg/dL (ref 8.4–10.5)
Chloride: 95 mEq/L — ABNORMAL LOW (ref 96–112)
Creatinine, Ser: 0.61 mg/dL (ref 0.50–1.10)
GFR calc Af Amer: >90 mL/min (ref >90)
Glucose, Bld: 472 mg/dL — ABNORMAL HIGH (ref 70–99)
POTASSIUM: 4.3 meq/L (ref 3.7–5.3)
Sodium: 135 mEq/L — ABNORMAL LOW (ref 137–147)
Total Protein: 7.4 g/dL (ref 6.0–8.3)

## 2013-11-25 LAB — URINALYSIS, ROUTINE W REFLEX MICROSCOPIC
Bilirubin Urine: NEGATIVE
KETONES UR: NEGATIVE mg/dL
Leukocytes, UA: NEGATIVE
Nitrite: NEGATIVE
PH: 6 (ref 5.0–8.0)
PROTEIN: NEGATIVE mg/dL
Specific Gravity, Urine: 1.045 — ABNORMAL HIGH (ref 1.005–1.030)
Urobilinogen, UA: 0.2 mg/dL (ref 0.0–1.0)

## 2013-11-25 LAB — CBC WITH DIFFERENTIAL/PLATELET
BASOS PCT: 0 % (ref 0–1)
Basophils Absolute: 0 10*3/uL (ref 0.0–0.1)
Eosinophils Absolute: 0.1 10*3/uL (ref 0.0–0.7)
Eosinophils Relative: 2 % (ref 0–5)
HEMATOCRIT: 35.4 % — AB (ref 36.0–46.0)
Hemoglobin: 11.6 g/dL — ABNORMAL LOW (ref 12.0–15.0)
LYMPHS PCT: 24 % (ref 12–46)
Lymphs Abs: 1.6 10*3/uL (ref 0.7–4.0)
MCH: 24 pg — ABNORMAL LOW (ref 26.0–34.0)
MCHC: 32.8 g/dL (ref 30.0–36.0)
MCV: 73.1 fL — ABNORMAL LOW (ref 78.0–100.0)
Monocytes Absolute: 0.2 10*3/uL (ref 0.1–1.0)
Monocytes Relative: 4 % (ref 3–12)
NEUTROS PCT: 71 % (ref 43–77)
Neutro Abs: 4.7 10*3/uL (ref 1.7–7.7)
Platelets: 301 10*3/uL (ref 150–400)
RBC: 4.84 MIL/uL (ref 3.87–5.11)
RDW: 14 % (ref 11.5–15.5)
WBC: 6.6 10*3/uL (ref 4.0–10.5)

## 2013-11-25 LAB — URINE MICROSCOPIC-ADD ON

## 2013-11-25 LAB — POC URINE PREG, ED: Preg Test, Ur: NEGATIVE

## 2013-11-25 LAB — KETONES, QUALITATIVE: Acetone, Bld: NEGATIVE

## 2013-11-25 MED ORDER — MORPHINE SULFATE 4 MG/ML IJ SOLN
2.0000 mg | Freq: Once | INTRAMUSCULAR | Status: AC
  Administered 2013-11-25: 2 mg via INTRAVENOUS
  Filled 2013-11-25: qty 1

## 2013-11-25 MED ORDER — INSULIN ASPART 100 UNIT/ML ~~LOC~~ SOLN
10.0000 [IU] | Freq: Once | SUBCUTANEOUS | Status: AC
  Administered 2013-11-26: 10 [IU] via SUBCUTANEOUS
  Filled 2013-11-25: qty 1

## 2013-11-25 MED ORDER — SODIUM CHLORIDE 0.9 % IV BOLUS (SEPSIS)
1000.0000 mL | Freq: Once | INTRAVENOUS | Status: AC
  Administered 2013-11-26: 1000 mL via INTRAVENOUS

## 2013-11-25 MED ORDER — SODIUM CHLORIDE 0.9 % IV BOLUS (SEPSIS)
1000.0000 mL | Freq: Once | INTRAVENOUS | Status: AC
Start: 2013-11-25 — End: 2013-11-26
  Administered 2013-11-25: 1000 mL via INTRAVENOUS

## 2013-11-25 NOTE — ED Provider Notes (Signed)
CSN: [id number]     Arrival date & time 11/25/13  1943 History   First MD Initiated Contact with Patient 11/25/13 2153     Chief Complaint  Patient presents with  . Abdominal Pain   HPI  Beverly Wells is a 23 y.o. female with a PMH of DM (insulin dependent) and DKA who presents to the ED for evaluation of abdominal pain. History was provided by the patient. Patient states she developed abdominal pain this afternoon. Pain similar to her abdominal cramping in the past with menstrual cycles. Has hx of dysmenorrhea. Started menstrual cycle yesterday. Pain is diffusely located throughout her lower and middle abdomen and is intermittent. Nothing makes it better/worse. Took Tylenol with no relief PTA. No previous abdominal surgeries. Had mild nausea with no emesis. Patient denies any diarrhea, constipation, dysuria, vaginal discharge, or back pain. Takes 40 units insulin twice daily. Came to ED because she is worried that she may develop DKA. She has a hx of this. Blood sugars have been running high from previous URI like illness she was seen for a few days ago, which resolved (11/18/13). Patient denies any fevers, chills, change in appetite/activity, sore throat, cough, rhinorrhea, headaches dizziness or lightheadedness.    Past Medical History  Diagnosis Date  . 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 ALLERGIC RHINITIS 05/07/2010  . DKA (diabetic ketoacidoses)   . Diabetes mellitus without complication    Past Surgical History  Procedure Laterality Date  . Tonsillectomy     Family History  Problem Relation Age of Onset  . Diabetes Neg Hx   . Other Mother   . Other Father    History  Substance Use Topics  . Smoking status: Never Smoker   . Smokeless tobacco: Not on file  . Alcohol Use: No   OB History   Grav Para Term Preterm Abortions TAB SAB Ect Mult Living                  Review of Systems  Constitutional: Negative for fever,  chills, diaphoresis, activity change, appetite change and fatigue.  HENT: Negative for congestion and sore throat.   Respiratory: Negative for cough and shortness of breath.   Cardiovascular: Negative for chest pain.  Gastrointestinal: Positive for nausea and abdominal pain. Negative for vomiting, diarrhea, constipation and blood in stool.  Genitourinary: Positive for vaginal bleeding (menstural period). Negative for dysuria, hematuria, vaginal discharge, difficulty urinating, vaginal pain, menstrual problem and pelvic pain.  Musculoskeletal: Negative for back pain and myalgias.  Neurological: Negative for dizziness, weakness and headaches.     Allergies  Penicillins; Banana; Orange concentrate; and Penicillins  Home Medications   Current Outpatient Rx  Name  Route  Sig  Dispense  Refill  . cetirizine (ZYRTEC) 10 MG tablet   Oral   Take 10 mg by mouth daily.           . Insulin Aspart Prot & Aspart (NOVOLOG MIX 70/30 FLEXPEN) (70-30) 100 UNIT/ML Pen   Subcutaneous   Inject 40 Units into the skin 2 (two) times daily.   60 mL   11    BP 121/73  Pulse 84  Temp(Src) 98.5 F (36.9 C) (Oral)  Resp 18  Ht 5' 6.5" (1.689 m)  Wt 154 lb 8 oz (70.081 kg)  BMI 24.57 kg/m2  SpO2 99%  LMP 10/28/2013  Filed Vitals:   11/26/13 0015 11/26/13 0030 11/26/13 0045 11/26/13 0100  BP: 113/74  120/84 112/73 121/80  Pulse: 77 70 81 74  Temp:      TempSrc:      Resp:      Height:      Weight:      SpO2: 100% 100% 100% 100%    Physical Exam  Nursing note and vitals reviewed. Constitutional: She is oriented to person, place, and time. She appears well-developed and well-nourished. No distress.  HENT:  Head: Normocephalic and atraumatic.  Right Ear: External ear normal.  Left Ear: External ear normal.  Mouth/Throat: Oropharynx is clear and moist.  Eyes: Conjunctivae are normal. Right eye exhibits no discharge. Left eye exhibits no discharge.  Neck: Normal range of motion. Neck  supple.  Cardiovascular: Normal rate, regular rhythm and normal heart sounds.  Exam reveals no gallop and no friction rub.   No murmur heard. Pulmonary/Chest: Effort normal and breath sounds normal. No respiratory distress. She has no wheezes. She has no rales. She exhibits no tenderness.  Abdominal: Soft. Bowel sounds are normal. She exhibits no distension and no mass. There is no tenderness. There is no rebound and no guarding.  Musculoskeletal: Normal range of motion. She exhibits no edema and no tenderness.  No CVA, lumbar, or flank tenderness bilaterally  Neurological: She is alert and oriented to person, place, and time.  Skin: Skin is warm and dry. She is not diaphoretic.    ED Course  Procedures (including critical care time) Labs Review Labs Reviewed  CBC WITH DIFFERENTIAL - Abnormal; Notable for the following:    Hemoglobin 11.6 (*)    HCT 35.4 (*)    MCV 73.1 (*)    MCH 24.0 (*)    All other components within normal limits  COMPREHENSIVE METABOLIC PANEL - Abnormal; Notable for the following:    Sodium 135 (*)    Chloride 95 (*)    Glucose, Bld 472 (*)    AST 40 (*)    Total Bilirubin <0.2 (*)    All other components within normal limits  URINALYSIS, ROUTINE W REFLEX MICROSCOPIC  POC URINE PREG, ED   Imaging Review No results found.   EKG Interpretation None      Results for orders placed during the hospital encounter of 11/25/13  CBC WITH DIFFERENTIAL      Result Value Ref Range   WBC 6.6  4.0 - 10.5 K/uL   RBC 4.84  3.87 - 5.11 MIL/uL   Hemoglobin 11.6 (*) 12.0 - 15.0 g/dL   HCT 40.9 (*) 81.1 - 91.4 %   MCV 73.1 (*) 78.0 - 100.0 fL   MCH 24.0 (*) 26.0 - 34.0 pg   MCHC 32.8  30.0 - 36.0 g/dL   RDW 78.2  95.6 - 21.3 %   Platelets 301  150 - 400 K/uL   Neutrophils Relative % 71  43 - 77 %   Neutro Abs 4.7  1.7 - 7.7 K/uL   Lymphocytes Relative 24  12 - 46 %   Lymphs Abs 1.6  0.7 - 4.0 K/uL   Monocytes Relative 4  3 - 12 %   Monocytes Absolute 0.2  0.1  - 1.0 K/uL   Eosinophils Relative 2  0 - 5 %   Eosinophils Absolute 0.1  0.0 - 0.7 K/uL   Basophils Relative 0  0 - 1 %   Basophils Absolute 0.0  0.0 - 0.1 K/uL  COMPREHENSIVE METABOLIC PANEL      Result Value Ref Range   Sodium 135 (*) 137 -  147 mEq/L   Potassium 4.3  3.7 - 5.3 mEq/L   Chloride 95 (*) 96 - 112 mEq/L   CO2 23  19 - 32 mEq/L   Glucose, Bld 472 (*) 70 - 99 mg/dL   BUN 9  6 - 23 mg/dL   Creatinine, Ser 1.610.61  0.50 - 1.10 mg/dL   Calcium 9.4  8.4 - 09.610.5 mg/dL   Total Protein 7.4  6.0 - 8.3 g/dL   Albumin 3.8  3.5 - 5.2 g/dL   AST 40 (*) 0 - 37 U/L   ALT 16  0 - 35 U/L   Alkaline Phosphatase 100  39 - 117 U/L   Total Bilirubin <0.2 (*) 0.3 - 1.2 mg/dL   GFR calc non Af Amer >90  >90 mL/min   GFR calc Af Amer >90  >90 mL/min  URINALYSIS, ROUTINE W REFLEX MICROSCOPIC      Result Value Ref Range   Color, Urine YELLOW  YELLOW   APPearance CLEAR  CLEAR   Specific Gravity, Urine 1.045 (*) 1.005 - 1.030   pH 6.0  5.0 - 8.0   Glucose, UA >1000 (*) NEGATIVE mg/dL   Hgb urine dipstick LARGE (*) NEGATIVE   Bilirubin Urine NEGATIVE  NEGATIVE   Ketones, ur NEGATIVE  NEGATIVE mg/dL   Protein, ur NEGATIVE  NEGATIVE mg/dL   Urobilinogen, UA 0.2  0.0 - 1.0 mg/dL   Nitrite NEGATIVE  NEGATIVE   Leukocytes, UA NEGATIVE  NEGATIVE  KETONES, QUALITATIVE      Result Value Ref Range   Acetone, Bld NEGATIVE  NEGATIVE  URINE MICROSCOPIC-ADD ON      Result Value Ref Range   Squamous Epithelial / LPF RARE  RARE   WBC, UA 0-2  <3 WBC/hpf   RBC / HPF TOO NUMEROUS TO COUNT  <3 RBC/hpf   Bacteria, UA RARE  RARE  POC URINE PREG, ED      Result Value Ref Range   Preg Test, Ur NEGATIVE  NEGATIVE  I-STAT VENOUS BLOOD GAS, ED      Result Value Ref Range   pH, Ven 7.405 (*) 7.250 - 7.300   pCO2, Ven 42.1 (*) 45.0 - 50.0 mmHg   pO2, Ven 33.0  30.0 - 45.0 mmHg   Bicarbonate 26.4 (*) 20.0 - 24.0 mEq/L   TCO2 28  0 - 100 mmol/L   O2 Saturation 63.0     Acid-Base Excess 1.0  0.0 - 2.0 mmol/L    Sample type VENOUS     Comment NOTIFIED PHYSICIAN    CBG MONITORING, ED      Result Value Ref Range   Glucose-Capillary 93  70 - 99 mg/dL     MDM   Rita OharaLenneek Delphina CahillMarie Wells is a 23 y.o. female with a PMH of DM (insulin dependent) and DKA who presents to the ED for evaluation of abdominal pain.   Rechecks  1:00 AM = Patient sleeping. Abdominal pain resolved.    Abdominal pain likely due to dysmenorrhea. Abdominal pain resolved throughout her ED visit. Abdominal exam benign. Labs showed an anion gap of 17 with a glucose of 472. No ketonuria or ketonemia. Patient given IV fluids which improved her glucose to 93. Patient encouraged to drink fluids and rest after discharge. Also had elevated liver enzymes (AST 40) which has been present in the past. Labs otherwise unremarkable. Patient afebrile and non-toxic in appearance. Patient instructed to follow-up with PCP. Take Ibuprofen for menstrual cramping. Return precautions, discharge instructions, and follow-up was  discussed with the patient before discharge.    Discharge Medication List as of 11/26/2013  1:09 AM       Final impressions: 1. Dysmenorrhea   2. Hyperglycemia       Luiz Iron PA-C   This patient was discussed with Dr. Candise Bowens, PA-C 11/26/13 1400

## 2013-11-25 NOTE — ED Notes (Signed)
Pt c/o lower abdominal cramping with n/v x 1. Denies diarrhea, fever, chills

## 2013-11-25 NOTE — ED Notes (Signed)
No response when called from waiting room x 1

## 2013-11-25 NOTE — ED Notes (Signed)
NO answer in waiting area for nurse first recheck.

## 2013-11-25 NOTE — ED Notes (Signed)
Pt refused a PIV insertion. Pt informed of need for PIV, but pt was adamant that she did not want a PIV. EDP made aware of pt's refusal.

## 2013-11-25 NOTE — ED Notes (Signed)
Pt reports lower abdominal pain x 3 days. States that she is on her period and is having lower abdominal pain and cramping. Pt states that this happens from time to time and causes her blood sugar to become elevated. Rates pain 10/10 at this time.

## 2013-11-26 LAB — CBG MONITORING, ED: Glucose-Capillary: 93 mg/dL (ref 70–99)

## 2013-11-26 NOTE — ED Notes (Signed)
CBG 93 

## 2013-11-26 NOTE — Discharge Instructions (Signed)
Take Ibuprofen 600-800 mg every 6-8 hours for menstrual cramping  Drink plenty of fluids  Continue to monitor your blood sugar  Return to the emergency department if you develop any changing/worsening condition, repeated vomiting, feeling faint, fever, or any other concerns (please read additional information regarding your condition below)   Dysmenorrhea Menstrual cramps (dysmenorrhea) are caused by the muscles of the uterus tightening (contracting) during a menstrual period. For some women, this discomfort is merely bothersome. For others, dysmenorrhea can be severe enough to interfere with everyday activities for a few days each month. Primary dysmenorrhea is menstrual cramps that last a couple of days when you start having menstrual periods or soon after. This often begins after a teenager starts having her period. As a woman gets older or has a baby, the cramps will usually lessen or disappear. Secondary dysmenorrhea begins later in life, lasts longer, and the pain may be stronger than primary dysmenorrhea. The pain may start before the period and last a few days after the period.  CAUSES  Dysmenorrhea is usually caused by an underlying problem, such as:  The tissue lining the uterus grows outside of the uterus in other areas of the body (endometriosis).  The endometrial tissue, which normally lines the uterus, is found in or grows into the muscular walls of the uterus (adenomyosis).  The pelvic blood vessels are engorged with blood just before the menstrual period (pelvic congestive syndrome).  Overgrowth of cells (polyps) in the lining of the uterus or cervix.  Falling down of the uterus (prolapse) because of loose or stretched ligaments.  Depression.  Bladder problems, infection, or inflammation.  Problems with the intestine, a tumor, or irritable bowel syndrome.  Cancer of the female organs or bladder.  A severely tipped uterus.  A very tight opening or closed  cervix.  Noncancerous tumors of the uterus (fibroids).  Pelvic inflammatory disease (PID).  Pelvic scarring (adhesions) from a previous surgery.  Ovarian cyst.  An intrauterine device (IUD) used for birth control. RISK FACTORS You may be at greater risk of dysmenorrhea if:  You are younger than age 45.  You started puberty early.  You have irregular or heavy bleeding.  You have never given birth.  You have a family history of this problem.  You are a smoker. SIGNS AND SYMPTOMS   Cramping or throbbing pain in your lower abdomen.  Headaches.  Lower back pain.  Nausea or vomiting.  Diarrhea.  Sweating or dizziness.  Loose stools. DIAGNOSIS  A diagnosis is based on your history, symptoms, physical exam, diagnostic tests, or procedures. Diagnostic tests or procedures may include:  Blood tests.  Ultrasonography.  An examination of the lining of the uterus (dilation and curettage, D&C).  An examination inside your abdomen or pelvis with a scope (laparoscopy).  X-rays.  CT scan.  MRI.  An examination inside the bladder with a scope (cystoscopy).  An examination inside the intestine or stomach with a scope (colonoscopy, gastroscopy). TREATMENT  Treatment depends on the cause of the dysmenorrhea. Treatment may include:  Pain medicine prescribed by your health care provider.  Birth control pills or an IUD with progesterone hormone in it.  Hormone replacement therapy.  Nonsteroidal anti-inflammatory drugs (NSAIDs). These may help stop the production of prostaglandins.  Surgery to remove adhesions, endometriosis, ovarian cyst, or fibroids.  Removal of the uterus (hysterectomy).  Progesterone shots to stop the menstrual period.  Cutting the nerves on the sacrum that go to the female organs (presacral neurectomy).  Mining engineer  current to the sacral nerves (sacral nerve stimulation).  Antidepressant medicine.  Psychiatric therapy, counseling, or group  therapy.  Exercise and physical therapy.  Meditation and yoga therapy.  Acupuncture. HOME CARE INSTRUCTIONS   Only take over-the-counter or prescription medicines as directed by your health care provider.  Place a heating pad or hot water bottle on your lower back or abdomen. Do not sleep with the heating pad.  Use aerobic exercises, walking, swimming, biking, and other exercises to help lessen the cramping.  Massage to the lower back or abdomen may help.  Stop smoking.  Avoid alcohol and caffeine. SEEK MEDICAL CARE IF:   Your pain does not get better with medicine.  You have pain with sexual intercourse.  Your pain increases and is not controlled with medicines.  You have abnormal vaginal bleeding with your period.  You develop nausea or vomiting with your period that is not controlled with medicine. SEEK IMMEDIATE MEDICAL CARE IF:  You pass out.  Document Released: 08/02/2005 Document Revised: 04/04/2013 Document Reviewed: 01/18/2013 [facility] Patient Information 2014 [place] CrossroadsExitCare, [place].  Hyperglycemia Hyperglycemia occurs when the glucose (sugar) in your blood is too high. Hyperglycemia can happen for many reasons, but it most often happens to people who do not know they have diabetes or are not managing their diabetes properly.  CAUSES  Whether you have diabetes or not, there are other causes of hyperglycemia. Hyperglycemia can occur when you have diabetes, but it can also occur in other situations that you might not be as aware of, such as: Diabetes  If you have diabetes and are having problems controlling your blood glucose, hyperglycemia could occur because of some of the following reasons:  Not following your meal plan.  Not taking your diabetes medications or not taking it properly.  Exercising less or doing less activity than you normally do.  Being sick. Pre-diabetes  This cannot be ignored. Before people develop Type 2 diabetes, they almost always have  "pre-diabetes." This is when your blood glucose levels are higher than normal, but not yet high enough to be diagnosed as diabetes. Research has shown that some long-term damage to the body, especially the heart and circulatory system, may already be occurring during pre-diabetes. If you take action to manage your blood glucose when you have pre-diabetes, you may delay or prevent Type 2 diabetes from developing. Stress  If you have diabetes, you may be "diet" controlled or on oral medications or insulin to control your diabetes. However, you may find that your blood glucose is higher than usual in the hospital whether you have diabetes or not. This is often referred to as "stress hyperglycemia." Stress can elevate your blood glucose. This happens because of hormones put out by the body during times of stress. If stress has been the cause of your high blood glucose, it can be followed regularly by your caregiver. That way he/she can make sure your hyperglycemia does not continue to get worse or progress to diabetes. Steroids  Steroids are medications that act on the infection fighting system (immune system) to block inflammation or infection. One side effect can be a rise in blood glucose. Most people can produce enough extra insulin to allow for this rise, but for those who cannot, steroids make blood glucose levels go even higher. It is not unusual for steroid treatments to "uncover" diabetes that is developing. It is not always possible to determine if the hyperglycemia will go away after the steroids are stopped. A special blood test  called an A1c is sometimes done to determine if your blood glucose was elevated before the steroids were started. SYMPTOMS  Thirsty.  Frequent urination.  Dry mouth.  Blurred vision.  Tired or fatigue.  Weakness.  Sleepy.  Tingling in feet or leg. DIAGNOSIS  Diagnosis is made by monitoring blood glucose in one or all of the following ways:  A1c test. This  is a chemical found in your blood.  Fingerstick blood glucose monitoring.  Laboratory results. TREATMENT  First, knowing the cause of the hyperglycemia is important before the hyperglycemia can be treated. Treatment may include, but is not be limited to:  Education.  Change or adjustment in medications.  Change or adjustment in meal plan.  Treatment for an illness, infection, etc.  More frequent blood glucose monitoring.  Change in exercise plan.  Decreasing or stopping steroids.  Lifestyle changes. HOME CARE INSTRUCTIONS   Test your blood glucose as directed.  Exercise regularly. Your caregiver will give you instructions about exercise. Pre-diabetes or diabetes which comes on with stress is helped by exercising.  Eat wholesome, balanced meals. Eat often and at regular, fixed times. Your caregiver or nutritionist will give you a meal plan to guide your sugar intake.  Being at an ideal weight is important. If needed, losing as little as 10 to 15 pounds may help improve blood glucose levels. SEEK MEDICAL CARE IF:   You have questions about medicine, activity, or diet.  You continue to have symptoms (problems such as increased thirst, urination, or weight gain). SEEK IMMEDIATE MEDICAL CARE IF:   You are vomiting or have diarrhea.  Your breath smells fruity.  You are breathing faster or slower.  You are very sleepy or incoherent.  You have numbness, tingling, or pain in your feet or hands.  You have chest pain.  Your symptoms get worse even though you have been following your caregiver's orders.  If you have any other questions or concerns. Document Released: 01/26/2001 Document Revised: 10/25/2011 Document Reviewed: 11/29/2011 [facility] Patient Information 2014 [place], [place].

## 2013-11-27 NOTE — ED Provider Notes (Signed)
Medical screening examination/treatment/procedure(s) were conducted as a shared visit with non-physician practitioner(s) and myself.  I personally evaluated the patient during the encounter.  Abdominal pain typical of menstrual cramps.  Hyperglycemia without ketones. Does not appear to be in DKA. Abdomen soft without peritoneal signs.  Recheck chemistry and AG after hydration.   EKG Interpretation None       Glynn OctaveStephen Shelsey Rieth, MD 11/27/13 [id number]

## 2013-12-21 ENCOUNTER — Encounter (HOSPITAL_COMMUNITY): Payer: Self-pay | Admitting: Emergency Medicine

## 2013-12-21 ENCOUNTER — Emergency Department (HOSPITAL_COMMUNITY): Payer: Medicaid Other

## 2013-12-21 ENCOUNTER — Emergency Department (HOSPITAL_COMMUNITY)
Admission: EM | Admit: 2013-12-21 | Discharge: 2013-12-22 | Disposition: A | Discharge status: Home / Self Care | Payer: Medicaid Other | Attending: Emergency Medicine | Admitting: Emergency Medicine

## 2013-12-21 DIAGNOSIS — Z91199 Patient's noncompliance with other medical treatment and regimen due to unspecified reason: Secondary | ICD-10-CM | POA: Insufficient documentation

## 2013-12-21 DIAGNOSIS — Z9119 Patient's noncompliance with other medical treatment and regimen: Secondary | ICD-10-CM | POA: Insufficient documentation

## 2013-12-21 DIAGNOSIS — B9789 Other viral agents as the cause of diseases classified elsewhere: Secondary | ICD-10-CM

## 2013-12-21 DIAGNOSIS — Z9089 Acquired absence of other organs: Secondary | ICD-10-CM | POA: Insufficient documentation

## 2013-12-21 DIAGNOSIS — E1110 Type 2 diabetes mellitus with ketoacidosis without coma: Secondary | ICD-10-CM | POA: Insufficient documentation

## 2013-12-21 DIAGNOSIS — Z794 Long term (current) use of insulin: Secondary | ICD-10-CM | POA: Insufficient documentation

## 2013-12-21 DIAGNOSIS — Z880 Allergy status to penicillin: Secondary | ICD-10-CM | POA: Insufficient documentation

## 2013-12-21 DIAGNOSIS — R000 Tachycardia, unspecified: Secondary | ICD-10-CM | POA: Insufficient documentation

## 2013-12-21 DIAGNOSIS — Z79899 Other long term (current) drug therapy: Secondary | ICD-10-CM | POA: Insufficient documentation

## 2013-12-21 DIAGNOSIS — J069 Acute upper respiratory infection, unspecified: Secondary | ICD-10-CM | POA: Insufficient documentation

## 2013-12-21 MED ORDER — ALBUTEROL SULFATE HFA 108 (90 BASE) MCG/ACT IN AERS
2.0000 | INHALATION_SPRAY | Freq: Once | RESPIRATORY_TRACT | Status: AC
  Administered 2013-12-21: 2 via RESPIRATORY_TRACT
  Filled 2013-12-21: qty 6.7

## 2013-12-21 MED ORDER — AEROCHAMBER PLUS W/MASK MISC
1.0000 | Freq: Once | Status: AC
  Administered 2013-12-21: 1

## 2013-12-21 MED ORDER — OXYMETAZOLINE HCL 0.05 % NA SOLN: 1.0000 | Freq: Once | NASAL | Status: DC

## 2013-12-21 NOTE — ED Provider Notes (Signed)
CSN: [id number]     Arrival date & time 12/21/13  2155 History  This chart was scribed for non-physician practitioner, Dierdre ForthHannah Danton Palmateer, PA-C, working with Doug SouSam Jacubowitz, MD by Charline BillsEssence Howell, ED Scribe. This patient was seen in room TR04C/TR04C and the patient's care was started at 11:13 PM.    Chief Complaint  Patient presents with  . Cough  . Nasal Congestion   The history is provided by the patient and medical records. No language interpreter was used.   HPI Comments: Beverly Wells is a 23 y.o. female who presents to the Emergency Department complaining of productive cough with yellow muscous onset 2 days ago. Pt reports an associated sore throat, chest tightness, difficulty breathing with laying down. Pt takes Zyrtec daily. Pt has also tried Careers adviserAllegra and Sudafed with no relief.  She denies fever, chills, headache and neck pain, chest pain, abdominal pain, nausea, vomiting, diarrhea, weakness, dizziness, syncope   Past Medical History  Diagnosis Date  . 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 ALLERGIC RHINITIS 05/07/2010  . DKA (diabetic ketoacidoses)   . Diabetes mellitus without complication    Past Surgical History  Procedure Laterality Date  . Tonsillectomy     Family History  Problem Relation Age of Onset  . Diabetes Neg Hx   . Other Mother   . Other Father    History  Substance Use Topics  . Smoking status: Never Smoker   . Smokeless tobacco: Not on file  . Alcohol Use: No   OB History   Grav Para Term Preterm Abortions TAB SAB Ect Mult Living                 Review of Systems  Constitutional: Positive for fatigue. Negative for fever, chills and appetite change.  HENT: Positive for congestion, postnasal drip, rhinorrhea, sinus pressure and sore throat. Negative for ear discharge, ear pain and mouth sores.   Eyes: Negative for visual disturbance.  Respiratory: Positive for cough, chest tightness, shortness of  breath and wheezing. Negative for stridor.   Cardiovascular: Negative for chest pain, palpitations and leg swelling.  Gastrointestinal: Negative for nausea, vomiting, abdominal pain and diarrhea.  Genitourinary: Negative for dysuria, urgency, frequency and hematuria.  Musculoskeletal: Negative for arthralgias, back pain, myalgias and neck stiffness.  Skin: Negative for rash.  Neurological: Negative for syncope, light-headedness, numbness and headaches.  Hematological: Negative for adenopathy.  Psychiatric/Behavioral: The patient is not nervous/anxious.   All other systems reviewed and are negative.   Allergies  Penicillins; Banana; Orange concentrate; and Penicillins  Home Medications   Prior to Admission medications   Medication Sig Start Date End Date Taking? Authorizing Provider  cetirizine (ZYRTEC) 10 MG tablet Take 10 mg by mouth daily.     Yes Historical Provider, MD  Insulin Aspart Prot & Aspart (NOVOLOG MIX 70/30 FLEXPEN) (70-30) 100 UNIT/ML Pen Inject 40 Units into the skin 2 (two) times daily. 09/07/13  Yes Marianne L York, PA-C  pseudoephedrine (SUDAFED) 30 MG tablet Take 30 mg by mouth every 4 (four) hours as needed for congestion.   Yes Historical Provider, MD   Molli Knockraige Vitals: BP 123/68  Pulse 106  Temp(Src) 98.1 F (36.7 C) (Oral)  Ht 5\' 6"  (1.676 m)  Wt 149 lb 14.4 oz (67.994 kg)  BMI 24.21 kg/m2  SpO2 100% Physical Exam  Constitutional: She is oriented to person, place, and time. She appears well-developed and well-nourished. No distress.  HENT:  Head:  Normocephalic and atraumatic.  Right Ear: Tympanic membrane, external ear and ear canal normal.  Left Ear: Tympanic membrane, external ear and ear canal normal.  Nose: Mucosal edema and rhinorrhea present. No epistaxis. Right sinus exhibits no maxillary sinus tenderness and no frontal sinus tenderness. Left sinus exhibits no maxillary sinus tenderness and no frontal sinus tenderness.  Mouth/Throat: Uvula is midline,  oropharynx is clear and moist and mucous membranes are normal. Mucous membranes are not pale and not cyanotic. No oropharyngeal exudate, posterior oropharyngeal edema, posterior oropharyngeal erythema or tonsillar abscesses.  Eyes: Conjunctivae are normal. Pupils are equal, round, and reactive to light.  Neck: Normal range of motion and full passive range of motion without pain.  Cardiovascular: Normal heart sounds and intact distal pulses.   No murmur heard. Mild tachycardia  Pulmonary/Chest: Effort normal. No accessory muscle usage or stridor. Not tachypneic. No respiratory distress. She has decreased breath sounds (throughout). She has no wheezes. She has no rhonchi. She has no rales. Chest wall is not dull to percussion. She exhibits no tenderness.  Dimensioned breath sounds throughout  No focal rhonchi, wheezes or rales   Abdominal: Soft. Bowel sounds are normal. She exhibits no distension. There is no tenderness.  Musculoskeletal: Normal range of motion.  Lymphadenopathy:    She has no cervical adenopathy.  Neurological: She is alert and oriented to person, place, and time. She exhibits normal muscle tone. Coordination normal.  Skin: Skin is warm and dry. No rash noted. She is not diaphoretic.  Psychiatric: She has a normal mood and affect.    ED Course  Procedures (including critical care time) DIAGNOSTIC STUDIES: Oxygen Saturation is 100% on RA, normal by my interpretation.    COORDINATION OF CARE: 11:15 PM Discussed treatment plan with pt at bedside and pt agreed to plan.  Labs Review Labs Reviewed - No data to display  Imaging Review Dg Chest 2 View  12/21/2013   CLINICAL DATA:  Cough and nasal congestion  EXAM: CHEST  2 VIEW  COMPARISON:  12/05/2012  FINDINGS: Normal heart size and mediastinal contours. No acute infiltrate or edema. No effusion or pneumothorax. No acute osseous findings.  IMPRESSION: No active cardiopulmonary disease.   Electronically Signed   By: Tiburcio Pea M.D.   On: 12/21/2013 23:58     EKG Interpretation None      MDM   Final diagnoses:  Viral URI with cough   Beverly Wells presents with 2 days of URI symptoms. Pt CXR negative for acute infiltrate. Patients symptoms are consistent with URI, likely viral etiology. I personally reviewed the imaging tests through PACS system.  I reviewed available ER/hospitalization records through the EMR.  Patient with increased tidal volume after albuterol treatment. Clear and equal breath sounds without focal wheezes, rhonchi or rales.  Discussed that antibiotics are not indicated for viral infections. Pt will be discharged with symptomatic treatment.  Verbalizes understanding and is agreeable with plan. Pt is hemodynamically stable & in NAD prior to dc.  Patient is to followup with her primary care physician within 3 days for further evaluation. Recommend return to the emergency department for fevers, shortness of breath or severe difficulty breathing.  It has been determined that no acute conditions requiring further emergency intervention are present at this time. The patient/guardian have been advised of the diagnosis and plan. We have discussed signs and symptoms that warrant return to the ED, such as changes or worsening in symptoms.   Vital signs are stable at discharge.  Mild tachycardia at triage, but resolved on my repeat exam after her MDI usage.  Patient/guardian has voiced understanding and agreed to follow-up with the PCP or specialist.  I personally performed the services described in this documentation, which was scribed in my presence. The recorded information has been reviewed and is accurate.   12:59 AM Patient returns to the ED stating that Medicaid will not pay for either  the Flonase or Tessalon. Will give Robitussin with codeine.   Dahlia ClientHannah Savera Donson, PA-C 12/22/13 0100

## 2013-12-21 NOTE — ED Notes (Signed)
Pt states she has been having cold like symptoms with ciough for the past two days

## 2013-12-22 MED ORDER — BENZONATATE 100 MG PO CAPS: 100.0000 mg | ORAL_CAPSULE | Freq: Three times a day (TID) | ORAL | Status: DC

## 2013-12-22 MED ORDER — FLUTICASONE PROPIONATE 50 MCG/ACT NA SUSP: 2.0000 | Freq: Every day | NASAL | Status: DC

## 2013-12-22 MED ORDER — GUAIFENESIN-CODEINE 100-10 MG/5ML PO SOLN: 5.0000 mL | Freq: Three times a day (TID) | ORAL | Status: DC | PRN

## 2013-12-22 NOTE — ED Provider Notes (Signed)
Medical screening examination/treatment/procedure(s) were performed by non-physician practitioner and as supervising physician I was immediately available for consultation/collaboration.   Belinda Schlichting, MD 12/22/13 0727 

## 2013-12-22 NOTE — Discharge Instructions (Signed)
1. Medications: afrin, flonase, albuterol, mucinex, tessalon, usual home medications 2. Treatment: rest, drink plenty of fluids, take tylenol or ibuprofen for fever control 3. Follow Up: Please followup with your primary doctor for discussion of your diagnoses and further evaluation after today's visit; if you do not have a primary care doctor use the resource guide provided to find one;   Upper Respiratory Infection, Adult An upper respiratory infection (URI) is also known as the common cold. It is often caused by a type of germ (virus). Colds are easily spread (contagious). You can pass it to others by kissing, coughing, sneezing, or drinking out of the same glass. Usually, you get better in 1 or 2 weeks.  HOME CARE   Only take medicine as told by your doctor.  Use a warm mist humidifier or breathe in steam from a hot shower.  Drink enough water and fluids to keep your pee (urine) clear or pale yellow.  Get plenty of rest.  Return to work when your temperature is back to normal or as told by your doctor. You may use a face mask and wash your hands to stop your cold from spreading. GET HELP RIGHT AWAY IF:   After the first few days, you feel you are getting worse.  You have questions about your medicine.  You have chills, shortness of breath, or brown or red spit (mucus).  You have yellow or brown snot (nasal discharge) or pain in the face, especially when you bend forward.  You have a fever, puffy (swollen) neck, pain when you swallow, or white spots in the back of your throat.  You have a bad headache, ear pain, sinus pain, or chest pain.  You have a high-pitched whistling sound when you breathe in and out (wheezing).  You have a lasting cough or cough up blood.  You have sore muscles or a stiff neck. MAKE SURE YOU:   Understand these instructions.  Will watch your condition.  Will get help right away if you are not doing well or get worse. Document Released: 01/19/2008  Document Revised: 10/25/2011 Document Reviewed: 12/07/2010 [facility] Patient Information 2014 [place]ExitCare, [place]LL[place].

## 2014-12-30 ENCOUNTER — Encounter (HOSPITAL_BASED_OUTPATIENT_CLINIC_OR_DEPARTMENT_OTHER): Payer: Self-pay | Admitting: Emergency Medicine

## 2014-12-30 ENCOUNTER — Emergency Department (HOSPITAL_BASED_OUTPATIENT_CLINIC_OR_DEPARTMENT_OTHER)
Admission: EM | Admit: 2014-12-30 | Discharge: 2014-12-30 | Disposition: A | Discharge status: Home / Self Care | Payer: Medicaid Other | Attending: Emergency Medicine | Admitting: Emergency Medicine

## 2014-12-30 DIAGNOSIS — Z79899 Other long term (current) drug therapy: Secondary | ICD-10-CM | POA: Diagnosis not present

## 2014-12-30 DIAGNOSIS — R21 Rash and other nonspecific skin eruption: Secondary | ICD-10-CM | POA: Insufficient documentation

## 2014-12-30 DIAGNOSIS — Z9119 Patient's noncompliance with other medical treatment and regimen: Secondary | ICD-10-CM | POA: Insufficient documentation

## 2014-12-30 DIAGNOSIS — Z880 Allergy status to penicillin: Secondary | ICD-10-CM | POA: Diagnosis not present

## 2014-12-30 DIAGNOSIS — Z794 Long term (current) use of insulin: Secondary | ICD-10-CM | POA: Diagnosis not present

## 2014-12-30 DIAGNOSIS — L509 Urticaria, unspecified: Secondary | ICD-10-CM | POA: Insufficient documentation

## 2014-12-30 DIAGNOSIS — Z8709 Personal history of other diseases of the respiratory system: Secondary | ICD-10-CM | POA: Diagnosis not present

## 2014-12-30 DIAGNOSIS — E119 Type 2 diabetes mellitus without complications: Secondary | ICD-10-CM | POA: Insufficient documentation

## 2014-12-30 MED ORDER — PREDNISONE 20 MG PO TABS: 40.0000 mg | ORAL_TABLET | Freq: Every day | ORAL | Status: AC

## 2014-12-30 MED ORDER — DEXAMETHASONE SODIUM PHOSPHATE 4 MG/ML IJ SOLN
12.0000 mg | Freq: Once | INTRAMUSCULAR | Status: AC
  Administered 2014-12-30: 12 mg via INTRAMUSCULAR
  Filled 2014-12-30: qty 3

## 2014-12-30 NOTE — Discharge Instructions (Signed)
As discussed, today's evaluation has been largely reassuring. Treatment of your illness, will include steroids, and is likely to result in elevated blood sugar levels. You should monitor these numbers carefully, and if you develop new, or concerning changes in your condition return here. Otherwise, please titrate your insulin dosing to address the hyperglycemia.

## 2014-12-30 NOTE — ED Provider Notes (Signed)
CSN: [id number]     Arrival date & time 12/30/14  1350 History   First MD Initiated Contact with Patient 12/30/14 1417     Chief Complaint  Patient presents with  . Rash     (Consider location/radiation/quality/duration/timing/severity/associated sxs/prior Treatment) HPI [facility] with concern of rash. She states that she has had a rash for about 1 week, primarily about the face, but also both upper extremities, both lower extremities, torso. Patient acknowledges type 1 diabetes, otherwise states that she was generally well. precipitant for the rash Patient has been taking antihistamines, without substantial change in her condition. However, the patient did take a short course of prednisone, with transient improvement in her condition, but stopped due to concerns of hyperglycemia. Currently the patient has no difficulty swallowing, speaking, breathing, confusion and this rotation, headache, fever, chills.  Past Medical History  Diagnosis Date  . 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 ALLERGIC RHINITIS 05/07/2010  . DKA (diabetic ketoacidoses)   . Diabetes mellitus without complication    Past Surgical History  Procedure Laterality Date  . Tonsillectomy     Family History  Problem Relation Age of Onset  . Diabetes Neg Hx   . Other Mother   . Other Father    History  Substance Use Topics  . Smoking status: Never Smoker   . Smokeless tobacco: Not on file  . Alcohol Use: No   OB History    No data available     Review of Systems  Constitutional:       Per HPI, otherwise negative  HENT:       Per HPI, otherwise negative  Respiratory:       Per HPI, otherwise negative  Cardiovascular:       Per HPI, otherwise negative  Gastrointestinal: Negative for vomiting.  Endocrine:       Negative aside from HPI  Genitourinary:       Neg aside from HPI   Musculoskeletal:       Per HPI, otherwise negative    Skin: Positive for rash.  Neurological: Negative for syncope.      Allergies  Penicillins; Banana; Orange concentrate; and Penicillins  Home Medications   Prior to Admission medications   Medication Sig Start Date End Date Taking? Authorizing Provider  cetirizine (ZYRTEC) 10 MG tablet Take 10 mg by mouth daily.      Historical Provider, MD  guaiFENesin-codeine 100-10 MG/5ML syrup Take 5 mLs by mouth every 8 (eight) hours as needed for cough. 12/22/13   Hannah Muthersbaugh, PA-C  Insulin Aspart Prot & Aspart (NOVOLOG MIX 70/30 FLEXPEN) (70-30) 100 UNIT/ML Pen Inject 40 Units into the skin 2 (two) times daily. 09/07/13   Tora KindredMarianne L York, PA-C  predniSONE (DELTASONE) 20 MG tablet Take 2 tablets (40 mg total) by mouth daily. 12/31/14 01/03/15  Gerhard Munchobert Tykisha Areola, MD  pseudoephedrine (SUDAFED) 30 MG tablet Take 30 mg by mouth every 4 (four) hours as needed for congestion.    Historical Provider, MD   BP 109/70 mmHg  Pulse 98  Temp(Src) 98.6 F (37 C) (Oral)  Resp 18  Ht 5\' 7"  (1.702 m)  Wt 175 lb (79.379 kg)  BMI 27.40 kg/m2  SpO2 99%  LMP 12/02/2014 Physical Exam  Constitutional: She is oriented to person, place, and time. She appears well-developed and well-nourished. No distress.  HENT:  Head: Normocephalic and atraumatic.  Eyes: Conjunctivae and EOM are normal.  Cardiovascular:  Normal rate and regular rhythm.   Pulmonary/Chest: Effort normal and breath sounds normal. No stridor. No respiratory distress.  Abdominal: She exhibits no distension.  Musculoskeletal: She exhibits no edema.  Neurological: She is alert and oriented to person, place, and time. No cranial nerve deficit.  Skin: Skin is warm and dry. Rash noted. Rash is urticarial.     Psychiatric: She has a normal mood and affect.  Nursing note and vitals reviewed.   ED Course  Procedures (including critical care time)   MDM   Final diagnoses:  Rash   Patient presents with cutaneous lesions consistent with  urticaria. No identified precipitant, but the patient is in no distress, is awake, alert, hemodynamically stable, with no evidence for sepsis or bacteremia. Patient has no other complaints, there is low suspicion for complications of diabetes.  Patient discharged in stable condition after initiating a course of steroids, and will continue her antihistamine therapy.   Gerhard Munchobert Peta Peachey, MD 12/30/14 [phone]

## 2014-12-30 NOTE — ED Notes (Signed)
Pt is inmate coming in with officers c/o rash all over body, visible on face in triage. States she does not know what is causing it, has been treated in the jail with antihistamines with no relief.

## 2014-12-30 NOTE — ED Notes (Signed)
D/c instructions given to deputy with pt- Asencion IslamMarva, RN (charge) spoke with RN at jail

## 2015-11-26 ENCOUNTER — Encounter (HOSPITAL_COMMUNITY): Payer: Self-pay | Admitting: Family Medicine

## 2015-11-26 ENCOUNTER — Emergency Department (HOSPITAL_COMMUNITY)
Admission: EM | Admit: 2015-11-26 | Discharge: 2015-11-26 | Disposition: A | Discharge status: Home / Self Care | Payer: Medicaid Other | Attending: Emergency Medicine | Admitting: Emergency Medicine

## 2015-11-26 DIAGNOSIS — R05 Cough: Secondary | ICD-10-CM

## 2015-11-26 DIAGNOSIS — E876 Hypokalemia: Secondary | ICD-10-CM | POA: Diagnosis not present

## 2015-11-26 DIAGNOSIS — E119 Type 2 diabetes mellitus without complications: Secondary | ICD-10-CM | POA: Insufficient documentation

## 2015-11-26 DIAGNOSIS — Z3202 Encounter for pregnancy test, result negative: Secondary | ICD-10-CM | POA: Diagnosis not present

## 2015-11-26 DIAGNOSIS — E010 Iodine-deficiency related diffuse (endemic) goiter: Secondary | ICD-10-CM | POA: Diagnosis not present

## 2015-11-26 DIAGNOSIS — R0981 Nasal congestion: Secondary | ICD-10-CM | POA: Insufficient documentation

## 2015-11-26 DIAGNOSIS — F17200 Nicotine dependence, unspecified, uncomplicated: Secondary | ICD-10-CM | POA: Insufficient documentation

## 2015-11-26 DIAGNOSIS — Z794 Long term (current) use of insulin: Secondary | ICD-10-CM | POA: Diagnosis not present

## 2015-11-26 DIAGNOSIS — R059 Cough, unspecified: Secondary | ICD-10-CM

## 2015-11-26 DIAGNOSIS — R591 Generalized enlarged lymph nodes: Secondary | ICD-10-CM

## 2015-11-26 DIAGNOSIS — J029 Acute pharyngitis, unspecified: Secondary | ICD-10-CM | POA: Diagnosis present

## 2015-11-26 DIAGNOSIS — Z880 Allergy status to penicillin: Secondary | ICD-10-CM | POA: Diagnosis not present

## 2015-11-26 DIAGNOSIS — R590 Localized enlarged lymph nodes: Secondary | ICD-10-CM | POA: Diagnosis not present

## 2015-11-26 DIAGNOSIS — Z79899 Other long term (current) drug therapy: Secondary | ICD-10-CM | POA: Insufficient documentation

## 2015-11-26 DIAGNOSIS — M7989 Other specified soft tissue disorders: Secondary | ICD-10-CM

## 2015-11-26 LAB — I-STAT BETA HCG BLOOD, ED (MC, WL, AP ONLY)

## 2015-11-26 LAB — COMPREHENSIVE METABOLIC PANEL
ALT: 19 U/L (ref 14–54)
AST: 19 U/L (ref 15–41)
Albumin: 3.3 g/dL — ABNORMAL LOW (ref 3.5–5.0)
Alkaline Phosphatase: 85 U/L (ref 38–126)
Anion gap: 10 (ref 5–15)
BUN: 6 mg/dL (ref 6–20)
CHLORIDE: 104 mmol/L (ref 101–111)
CO2: 28 mmol/L (ref 22–32)
CREATININE: 0.72 mg/dL (ref 0.44–1.00)
Calcium: 8.9 mg/dL (ref 8.9–10.3)
GFR calc non Af Amer: >60 mL/min (ref >60)
Glucose, Bld: 279 mg/dL — ABNORMAL HIGH (ref 65–99)
Potassium: 3.3 mmol/L — ABNORMAL LOW (ref 3.5–5.1)
Sodium: 142 mmol/L (ref 135–145)
Total Bilirubin: 0.6 mg/dL (ref 0.3–1.2)
Total Protein: 6.5 g/dL (ref 6.5–8.1)

## 2015-11-26 LAB — CBC
HCT: 33.4 % — ABNORMAL LOW (ref 36.0–46.0)
Hemoglobin: 10.7 g/dL — ABNORMAL LOW (ref 12.0–15.0)
MCH: 23.8 pg — AB (ref 26.0–34.0)
MCHC: 32 g/dL (ref 30.0–36.0)
MCV: 74.4 fL — ABNORMAL LOW (ref 78.0–100.0)
Platelets: 218 10*3/uL (ref 150–400)
RBC: 4.49 MIL/uL (ref 3.87–5.11)
RDW: 13.5 % (ref 11.5–15.5)
WBC: 6.9 10*3/uL (ref 4.0–10.5)

## 2015-11-26 LAB — CBG MONITORING, ED: GLUCOSE-CAPILLARY: 246 mg/dL — AB (ref 65–99)

## 2015-11-26 MED ORDER — HYDROCHLOROTHIAZIDE 12.5 MG PO TABS: 12.5000 mg | ORAL_TABLET | Freq: Every day | ORAL | Status: DC

## 2015-11-26 MED ORDER — POTASSIUM CHLORIDE CRYS ER 20 MEQ PO TBCR: 20.0000 meq | EXTENDED_RELEASE_TABLET | Freq: Two times a day (BID) | ORAL | Status: DC

## 2015-11-26 NOTE — ED Notes (Addendum)
Pt here for abscess to throat. sts recently treated with clindamycin for it. sts the abscess was gone and now is back. Pt also here with swelling to BLE.

## 2015-11-26 NOTE — ED Notes (Signed)
Pt arrives with large bag of food and large drink, asked not to have anything else to eat or drink until she sees dr

## 2015-11-26 NOTE — ED Provider Notes (Signed)
CSN: [id number]     Arrival date & time 11/26/15  1242 History   First MD Initiated Contact with Patient 11/26/15 1528     Chief Complaint  Patient presents with  . Sore Throat     (Consider location/radiation/quality/duration/timing/severity/associated sxs/prior Treatment) Patient is a 25 y.o. female presenting with pharyngitis.  Sore Throat This is a recurrent problem. The current episode started 2 days ago. The problem occurs constantly. Pertinent negatives include no chest pain and no shortness of breath. Nothing aggravates the symptoms. Nothing relieves the symptoms. She has tried nothing for the symptoms.    Past Medical History  Diagnosis Date  . 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 ALLERGIC RHINITIS 05/07/2010  . DKA (diabetic ketoacidoses) (HCC)   . Diabetes mellitus without complication [facility](HCC)    Past Surgical History  Procedure Laterality Date  . Tonsillectomy     Family History  Problem Relation Age of Onset  . Diabetes Neg Hx   . Other Mother   . Other Father    Social History  Substance Use Topics  . Smoking status: Current Every Day Smoker  . Smokeless tobacco: None  . Alcohol Use: No   OB History    No data available     Review of Systems  Constitutional: Negative for fever and chills.  HENT: Positive for congestion and sore throat. Negative for trouble swallowing and voice change.   Eyes: Negative for pain and redness.  Respiratory: Positive for cough. Negative for shortness of breath.   Cardiovascular: Negative for chest pain.  All other systems reviewed and are negative.     Allergies  Penicillins; Banana; Orange concentrate; and Penicillins  Home Medications   Prior to Admission medications   Medication Sig Start Date End Date Taking? Authorizing Provider  cetirizine (ZYRTEC) 10 MG tablet Take 10 mg by mouth daily.      Historical Provider, MD  guaiFENesin-codeine 100-10 MG/5ML syrup Take 5 mLs  by mouth every 8 (eight) hours as needed for cough. 12/22/13   Hannah Muthersbaugh, PA-C  hydrochlorothiazide (HYDRODIURIL) 12.5 MG tablet Take 1 tablet (12.5 mg total) by mouth daily. 11/26/15   Marily MemosJason Lylah Lantis, MD  Insulin Aspart Prot & Aspart (NOVOLOG MIX 70/30 FLEXPEN) (70-30) 100 UNIT/ML Pen Inject 40 Units into the skin 2 (two) times daily. 09/07/13   Tora KindredMarianne L York, PA-C  pseudoephedrine (SUDAFED) 30 MG tablet Take 30 mg by mouth every 4 (four) hours as needed for congestion.    Historical Provider, MD   BP 124/84 mmHg  Pulse 117  Temp(Src) 97.5 F (36.4 C) (Oral)  Resp 18  Ht 5\' 7"  (1.702 m)  Wt 175 lb (79.379 kg)  BMI 27.40 kg/m2  SpO2 98%  LMP 11/24/2015 Physical Exam  Constitutional: She appears well-developed and well-nourished.  HENT:  Head: Normocephalic and atraumatic.  Neck: Normal range of motion. No tracheal tenderness present. Thyromegaly present. No thyroid mass present.  Right anterior cervical lymphadenopathy  Cardiovascular: Normal rate and regular rhythm.   Pulmonary/Chest: No stridor. No respiratory distress.  Abdominal: She exhibits no distension.  Neurological: She is alert.  Nursing note and vitals reviewed.   ED Course  Procedures (including critical care time)   Counseled patient for approximately 6 minutes regarding smoking cessation. Discussed risks of smoking and how they applied and affected their visit here today. Patient not ready to quit at this time, however will follow up with their primary doctor when they are.  CPT code: 13086: intermediate counseling for smoking cessation   Labs Review Labs Reviewed  COMPREHENSIVE METABOLIC PANEL - Abnormal; Notable for the following:    Potassium 3.3 (*)    Glucose, Bld 279 (*)    Albumin 3.3 (*)    All other components within normal limits  CBC - Abnormal; Notable for the following:    Hemoglobin 10.7 (*)    HCT 33.4 (*)    MCV 74.4 (*)    MCH 23.8 (*)    All other components within normal limits   CBG MONITORING, ED - Abnormal; Notable for the following:    Glucose-Capillary 246 (*)    All other components within normal limits  URINALYSIS, ROUTINE W REFLEX MICROSCOPIC (NOT AT [facility])  I-STAT BETA HCG BLOOD, ED ([facility], [facility], [facility] ONLY)    Imaging Review No results found. I have personally reviewed and evaluated these images and lab results as part of my medical decision-making.   EKG Interpretation None      MDM   Final diagnoses:  Cough  Lymphadenopathy  Leg swelling    25 yo F recently treated for some type of HENT infectious process with persistent lymphadenopathy. No e/o abscess. Also with thyromegaly (slight). Improving lower extremity edema on HCTZ (wants refill). Also with cough (recently released from jail and has been 'smoking hella cigarettes and weed', doubt PNA, clear lungs, doubt bronchitis, counseled on smoking cessation.  Slightly low K, will advise potassium whil3e continuing HCTZ.   New Prescriptions: New Prescriptions   HYDROCHLOROTHIAZIDE (HYDRODIURIL) 12.5 MG TABLET    Take 1 tablet (12.5 mg total) by mouth daily.     I have personally and contemperaneously reviewed labs and imaging and used in my decision making as above.   A medical screening exam was performed and I feel the patient has had an appropriate workup for their chief complaint at this time and likelihood of emergent condition existing is low. Their vital signs are stable. They have been counseled on decision, discharge, follow up and which symptoms necessitate immediate return to the emergency department.  They verbally stated understanding and agreement with plan and discharged in stable condition.      Marily Memos, MD 11/26/15 [phone]

## 2015-11-26 NOTE — ED Notes (Signed)
Pt statesshe understands instructions. 

## 2015-11-30 ENCOUNTER — Encounter (HOSPITAL_BASED_OUTPATIENT_CLINIC_OR_DEPARTMENT_OTHER): Payer: Self-pay | Admitting: *Deleted

## 2015-11-30 ENCOUNTER — Emergency Department (HOSPITAL_BASED_OUTPATIENT_CLINIC_OR_DEPARTMENT_OTHER)
Admission: EM | Admit: 2015-11-30 | Discharge: 2015-11-30 | Disposition: A | Discharge status: Home / Self Care | Payer: Medicaid Other | Attending: Emergency Medicine | Admitting: Emergency Medicine

## 2015-11-30 ENCOUNTER — Emergency Department (HOSPITAL_BASED_OUTPATIENT_CLINIC_OR_DEPARTMENT_OTHER): Payer: Medicaid Other

## 2015-11-30 DIAGNOSIS — F17200 Nicotine dependence, unspecified, uncomplicated: Secondary | ICD-10-CM | POA: Insufficient documentation

## 2015-11-30 DIAGNOSIS — E1010 Type 1 diabetes mellitus with ketoacidosis without coma: Secondary | ICD-10-CM | POA: Insufficient documentation

## 2015-11-30 DIAGNOSIS — Z794 Long term (current) use of insulin: Secondary | ICD-10-CM | POA: Diagnosis not present

## 2015-11-30 DIAGNOSIS — J069 Acute upper respiratory infection, unspecified: Secondary | ICD-10-CM | POA: Diagnosis not present

## 2015-11-30 DIAGNOSIS — R52 Pain, unspecified: Secondary | ICD-10-CM | POA: Diagnosis present

## 2015-11-30 DIAGNOSIS — N390 Urinary tract infection, site not specified: Secondary | ICD-10-CM | POA: Diagnosis not present

## 2015-11-30 LAB — BASIC METABOLIC PANEL
ANION GAP: 9 (ref 5–15)
CHLORIDE: 101 mmol/L (ref 101–111)
CO2: 26 mmol/L (ref 22–32)
Calcium: 8.9 mg/dL (ref 8.9–10.3)
Creatinine, Ser: 0.58 mg/dL (ref 0.44–1.00)
GFR calc Af Amer: >60 mL/min (ref >60)
GFR calc non Af Amer: >60 mL/min (ref >60)
GLUCOSE: 120 mg/dL — AB (ref 65–99)
POTASSIUM: 2.8 mmol/L — AB (ref 3.5–5.1)
Sodium: 136 mmol/L (ref 135–145)

## 2015-11-30 LAB — CBC WITH DIFFERENTIAL/PLATELET
BASOS ABS: 0 10*3/uL (ref 0.0–0.1)
BASOS PCT: 0 %
Band Neutrophils: 1 %
EOS PCT: 1 %
Eosinophils Absolute: 0.1 10*3/uL (ref 0.0–0.7)
HEMATOCRIT: 33.9 % — AB (ref 36.0–46.0)
HEMOGLOBIN: 11.3 g/dL — AB (ref 12.0–15.0)
LYMPHS PCT: 13 %
Lymphs Abs: 1.4 10*3/uL (ref 0.7–4.0)
MCH: 24.7 pg — AB (ref 26.0–34.0)
MCHC: 33.3 g/dL (ref 30.0–36.0)
MCV: 74 fL — AB (ref 78.0–100.0)
MONO ABS: 0.4 10*3/uL (ref 0.1–1.0)
MONOS PCT: 4 %
NEUTROS PCT: 81 %
Neutro Abs: 8.9 10*3/uL — ABNORMAL HIGH (ref 1.7–7.7)
Platelets: 243 10*3/uL (ref 150–400)
RBC: 4.58 MIL/uL (ref 3.87–5.11)
RDW: 13.7 % (ref 11.5–15.5)
WBC: 10.8 10*3/uL — ABNORMAL HIGH (ref 4.0–10.5)

## 2015-11-30 LAB — URINE MICROSCOPIC-ADD ON

## 2015-11-30 LAB — PREGNANCY, URINE: Preg Test, Ur: NEGATIVE

## 2015-11-30 LAB — URINALYSIS, ROUTINE W REFLEX MICROSCOPIC
Bilirubin Urine: NEGATIVE
GLUCOSE, UA: NEGATIVE mg/dL
KETONES UR: 15 mg/dL — AB
NITRITE: NEGATIVE
PH: 6 (ref 5.0–8.0)
PROTEIN: 30 mg/dL — AB
Specific Gravity, Urine: 1.009 (ref 1.005–1.030)

## 2015-11-30 LAB — CBG MONITORING, ED: Glucose-Capillary: 151 mg/dL — ABNORMAL HIGH (ref 65–99)

## 2015-11-30 LAB — RAPID STREP SCREEN (MED CTR MEBANE ONLY): Streptococcus, Group A Screen (Direct): NEGATIVE

## 2015-11-30 MED ORDER — SODIUM CHLORIDE 0.9 % IV BOLUS (SEPSIS)
1000.0000 mL | Freq: Once | INTRAVENOUS | Status: AC
  Administered 2015-11-30: 1000 mL via INTRAVENOUS

## 2015-11-30 MED ORDER — SULFAMETHOXAZOLE-TRIMETHOPRIM 800-160 MG PO TABS: 1.0000 | ORAL_TABLET | Freq: Two times a day (BID) | ORAL | Status: AC

## 2015-11-30 MED ORDER — ACETAMINOPHEN 500 MG PO TABS
1000.0000 mg | ORAL_TABLET | Freq: Once | ORAL | Status: AC
  Administered 2015-11-30: 1000 mg via ORAL
  Filled 2015-11-30: qty 2

## 2015-11-30 MED ORDER — POTASSIUM CHLORIDE CRYS ER 20 MEQ PO TBCR
40.0000 meq | EXTENDED_RELEASE_TABLET | Freq: Once | ORAL | Status: AC
  Administered 2015-11-30: 40 meq via ORAL
  Filled 2015-11-30: qty 2

## 2015-11-30 MED ORDER — ONDANSETRON HCL 4 MG/2ML IJ SOLN
4.0000 mg | INTRAMUSCULAR | Status: AC
Start: 2015-11-30 — End: 2015-11-30
  Administered 2015-11-30: 4 mg via INTRAVENOUS
  Filled 2015-11-30: qty 2

## 2015-11-30 NOTE — ED Provider Notes (Signed)
CSN: [id number]     Arrival date & time 11/30/15  1851 History   First MD Initiated Contact with Patient 11/30/15 1929     Chief Complaint  Patient presents with  . Generalized Body Aches    HPI   Beverly Wells is a 25 y.o. female with a PMH of DM who presents to the ED with fever, productive cough, sore throat, and generalized body aches. She states her symptoms have been present for the past week. She denies exacerbating or alleviating factors. She also notes posttussive emesis. She denies sick contacts. She denies chest pain, shortness of breath, abdominal pain, diarrhea, constipation, dysuria, urgency. She notes urinary frequency, but states this is common when her blood sugar becomes elevated.   Past Medical History  Diagnosis Date  . 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 ALLERGIC RHINITIS 05/07/2010  . DKA (diabetic ketoacidoses) (HCC)   . Diabetes mellitus without complication [facility])    Past Surgical History  Procedure Laterality Date  . Tonsillectomy     Family History  Problem Relation Age of Onset  . Diabetes Neg Hx   . Other Mother   . Other Father    Social History  Substance Use Topics  . Smoking status: Current Every Day Smoker  . Smokeless tobacco: None  . Alcohol Use: No   OB History    No data available      Review of Systems  Constitutional: Positive for fever. Negative for chills.  HENT: Positive for sore throat. Negative for congestion.   Respiratory: Positive for cough.   Gastrointestinal: Positive for nausea and vomiting. Negative for abdominal pain and diarrhea.  Genitourinary: Positive for frequency. Negative for dysuria and urgency.  Musculoskeletal: Positive for myalgias.  All other systems reviewed and are negative.     Allergies  Penicillins; Banana; Orange concentrate; and Penicillins  Home Medications   Prior to Admission medications   Medication Sig Start Date End Date Taking?  Authorizing Provider  cetirizine (ZYRTEC) 10 MG tablet Take 10 mg by mouth daily.      Historical Provider, MD  guaiFENesin-codeine 100-10 MG/5ML syrup Take 5 mLs by mouth every 8 (eight) hours as needed for cough. 12/22/13   Hannah Muthersbaugh, PA-C  hydrochlorothiazide (HYDRODIURIL) 12.5 MG tablet Take 1 tablet (12.5 mg total) by mouth daily. 11/26/15   Marily Memos, MD  Insulin Aspart Prot & Aspart (NOVOLOG MIX 70/30 FLEXPEN) (70-30) 100 UNIT/ML Pen Inject 40 Units into the skin 2 (two) times daily. 09/07/13   Tora Kindred York, PA-C  potassium chloride SA (K-DUR,KLOR-CON) 20 MEQ tablet Take 1 tablet (20 mEq total) by mouth 2 (two) times daily. While on the hydrochlorothiazide 11/26/15   Marily Memos, MD  pseudoephedrine (SUDAFED) 30 MG tablet Take 30 mg by mouth every 4 (four) hours as needed for congestion.    Historical Provider, MD  sulfamethoxazole-trimethoprim (BACTRIM DS,SEPTRA DS) 800-160 MG tablet Take 1 tablet by mouth 2 (two) times daily. 11/30/15 12/07/15  Mady Gemma, PA-C    BP 119/78 mmHg  Pulse 92  Temp(Src) 99.9 F (37.7 C) (Oral)  Resp 18  Ht  (1.702 m)  Wt 72.576 kg  BMI 25.05 kg/m2  SpO2 100%  LMP 11/24/2015 Physical Exam  Constitutional: She is oriented to person, place, and time. She appears well-developed and well-nourished. No distress.  HENT:  Head: Normocephalic and atraumatic.  Right Ear: External ear normal.  Left Ear: External ear normal.  Nose: Nose normal.  Mouth/Throat: Uvula is midline and mucous membranes are normal. Posterior oropharyngeal erythema present. No oropharyngeal exudate, posterior oropharyngeal edema or tonsillar abscesses.  Mild erythema to posterior oropharynx.  Eyes: Conjunctivae, EOM and lids are normal. Pupils are equal, round, and reactive to light. Right eye exhibits no discharge. Left eye exhibits no discharge. No scleral icterus.  Neck: Normal range of motion. Neck supple.  Cardiovascular: Normal rate, regular rhythm,  normal heart sounds, intact distal pulses and normal pulses.   Pulmonary/Chest: Effort normal and breath sounds normal. No respiratory distress. She has no wheezes. She has no rales.  Abdominal: Soft. Normal appearance and bowel sounds are normal. She exhibits no distension and no mass. There is no tenderness. There is no rigidity, no rebound and no guarding.  Musculoskeletal: Normal range of motion. She exhibits no edema or tenderness.  Neurological: She is alert and oriented to person, place, and time. She has normal strength. No cranial nerve deficit or sensory deficit.  Skin: Skin is warm, dry and intact. No rash noted. She is not diaphoretic. No erythema. No pallor.  Psychiatric: She has a normal mood and affect. Her speech is normal and behavior is normal.  Nursing note and vitals reviewed.   ED Course  Procedures (including critical care time)  Labs Review Labs Reviewed  CBC WITH DIFFERENTIAL/PLATELET - Abnormal; Notable for the following:    WBC 10.8 (*)    Hemoglobin 11.3 (*)    HCT 33.9 (*)    MCV 74.0 (*)    MCH 24.7 (*)    Neutro Abs 8.9 (*)    All other components within normal limits  BASIC METABOLIC PANEL - Abnormal; Notable for the following:    Potassium 2.8 (*)    Glucose, Bld 120 (*)    BUN <5 (*)    All other components within normal limits  URINALYSIS, ROUTINE W REFLEX MICROSCOPIC (NOT AT [facility]) - Abnormal; Notable for the following:    APPearance TURBID (*)    Hgb urine dipstick MODERATE (*)    Ketones, ur 15 (*)    Protein, ur 30 (*)    Leukocytes, UA LARGE (*)    All other components within normal limits  URINE MICROSCOPIC-ADD ON - Abnormal; Notable for the following:    Squamous Epithelial / LPF 0-5 (*)    Bacteria, UA MANY (*)    All other components within normal limits  CBG MONITORING, ED - Abnormal; Notable for the following:    Glucose-Capillary 151 (*)    All other components within normal limits  RAPID STREP SCREEN (NOT AT [facility])  CULTURE,  GROUP A STREP [facility])  URINE CULTURE  PREGNANCY, URINE    Imaging Review Dg Chest 2 View  11/30/2015  CLINICAL DATA:  Headache and sore throat.  Cough and fever. EXAM: CHEST - 2 VIEW COMPARISON:  Two-view chest x-ray 12/21/2013 FINDINGS: The heart size and mediastinal contours are within normal limits. Both lungs are clear. The visualized skeletal structures are unremarkable. IMPRESSION: Negative two view chest x-ray Electronically Signed   By: Marin Roberts M.D.   On: 11/30/2015 20:47   I have personally reviewed and evaluated these images and lab results as part of my medical decision-making.   EKG Interpretation None      MDM   Final diagnoses:  URI (upper respiratory infection)  UTI (lower urinary tract infection)    25 year old female presents with fever, productive cough, sore throat, and generalized body aches x 1  week. Notes posttussive emesis. Denies sick contacts. Denies chest pain, shortness of breath, abdominal pain, diarrhea, constipation, dysuria, urgency. Notes urinary frequency, but states this is common when her blood sugar becomes elevated.  Patient initially febrile to 101.9, heart rate 113, no hypotension. Mild erythema to posterior oropharynx. Patient handling her secretions well. Heart regular rhythm. Lungs clear to auscultation bilaterally. Abdomen soft, nontender, nondistended. Patient moves all extremities without difficulty.  Patient given fluids, zofran, and tylenol.  CBC remarkable for leukocytosis of 10.8, hemoglobin 11.3. BMP remarkable for potassium 2.8, patient given oral potassium in the ED. Urine pregnancy negative. UA remarkable for large leukocytes, TNTC WBCs, many bacteria. Rapid strep negative. Chest x-ray no active cardiopulmonary disease.  Discussed findings with patient. On reassessment, she reports significant symptom improvement. Repeat temperature 99.9, heart rate 92. Patient is nontoxic and well-appearing, feel she is stable for  discharge at this time. Symptoms likely do to viral URI. Patient also has evidence of UTI on UA. Urine culture ordered. Will treat with keflex. Patient to follow-up with PCP. Return precautions discussed. Patient verbalizes her understanding and is in agreement with plan.  BP 119/78 mmHg  Pulse 92  Temp(Src) 99.9 F (37.7 C) (Oral)  Resp 18  Ht 5\' 7"  (1.702 m)  Wt 72.576 kg  BMI 25.05 kg/m2  SpO2 100%  LMP 11/24/2015     Mady GemmaElizabeth C Westfall, PA-C 12/01/15 0032  Gwyneth SproutWhitney Plunkett, MD 12/03/15 1443

## 2015-11-30 NOTE — Discharge Instructions (Signed)
1. Medications: bactrim for UTI, tylenol or motrin for pain, usual home medications 2. Treatment: rest, drink plenty of fluids 3. Follow Up: please followup with your primary doctor for discussion of your diagnoses and further evaluation after today's visit; if you do not have a primary care doctor use the phone number listed in your discharge paperwork to find one; please return to the ER for high fever, severe pain, new or worsening symptoms   Upper Respiratory Infection, Adult Most upper respiratory infections (URIs) are a viral infection of the air passages leading to the lungs. A URI affects the nose, throat, and upper air passages. The most common type of URI is nasopharyngitis and is typically referred to as "the common cold." URIs run their course and usually go away on their own. Most of the time, a URI does not require medical attention, but sometimes a bacterial infection in the upper airways can follow a viral infection. This is called a secondary infection. Sinus and middle ear infections are common types of secondary upper respiratory infections. Bacterial pneumonia can also complicate a URI. A URI can worsen asthma and chronic obstructive pulmonary disease (COPD). Sometimes, these complications can require emergency medical care and may be life threatening.  CAUSES Almost all URIs are caused by viruses. A virus is a type of germ and can spread from one person to another.  RISKS FACTORS You may be at risk for a URI if:   You smoke.   You have chronic heart or lung disease.  You have a weakened defense (immune) system.   You are very young or very old.   You have nasal allergies or asthma.  You work in crowded or poorly ventilated areas.  You work in health care facilities or schools. SIGNS AND SYMPTOMS  Symptoms typically develop 2-3 days after you come in contact with a cold virus. Most viral URIs last 7-10 days. However, viral URIs from the influenza virus (flu virus)  can last 14-18 days and are typically more severe. Symptoms may include:   Runny or stuffy (congested) nose.   Sneezing.   Cough.   Sore throat.   Headache.   Fatigue.   Fever.   Loss of appetite.   Pain in your forehead, behind your eyes, and over your cheekbones (sinus pain).  Muscle aches.  DIAGNOSIS  Your health care provider may diagnose a URI by:  Physical exam.  Tests to check that your symptoms are not due to another condition such as:  Strep throat.  Sinusitis.  Pneumonia.  Asthma. TREATMENT  A URI goes away on its own with time. It cannot be cured with medicines, but medicines may be prescribed or recommended to relieve symptoms. Medicines may help:  Reduce your fever.  Reduce your cough.  Relieve nasal congestion. HOME CARE INSTRUCTIONS   Take medicines only as directed by your health care provider.   Gargle warm saltwater or take cough drops to comfort your throat as directed by your health care provider.  Use a warm mist humidifier or inhale steam from a shower to increase air moisture. This may make it easier to breathe.  Drink enough fluid to keep your urine clear or pale yellow.   Eat soups and other clear broths and maintain good nutrition.   Rest as needed.   Return to work when your temperature has returned to normal or as your health care provider advises. You may need to stay home longer to avoid infecting others. You can also use  a face mask and careful hand washing to prevent spread of the virus.  Increase the usage of your inhaler if you have asthma.   Do not use any tobacco products, including cigarettes, chewing tobacco, or electronic cigarettes. If you need help quitting, ask your health care provider. PREVENTION  The best way to protect yourself from getting a cold is to practice good hygiene.   Avoid oral or hand contact with people with cold symptoms.   Wash your hands often if contact occurs.  There is  no clear evidence that vitamin C, vitamin E, echinacea, or exercise reduces the chance of developing a cold. However, it is always recommended to get plenty of rest, exercise, and practice good nutrition.  SEEK MEDICAL CARE IF:   You are getting worse rather than better.   Your symptoms are not controlled by medicine.   You have chills.  You have worsening shortness of breath.  You have brown or red mucus.  You have yellow or brown nasal discharge.  You have pain in your face, especially when you bend forward.  You have a fever.  You have swollen neck glands.  You have pain while swallowing.  You have white areas in the back of your throat. SEEK IMMEDIATE MEDICAL CARE IF:   You have severe or persistent:  Headache.  Ear pain.  Sinus pain.  Chest pain.  You have chronic lung disease and any of the following:  Wheezing.  Prolonged cough.  Coughing up blood.  A change in your usual mucus.  You have a stiff neck.  You have changes in your:  Vision.  Hearing.  Thinking.  Mood. MAKE SURE YOU:   Understand these instructions.  Will watch your condition.  Will get help right away if you are not doing well or get worse.   This information is not intended to replace advice given to you by your health care provider. Make sure you discuss any questions you have with your health care provider.   Document Released: 01/26/2001 Document Revised: 12/17/2014 Document Reviewed: 11/07/2013 Elsevier Interactive Patient Education 2016 Elsevier Inc.  Urinary Tract Infection Urinary tract infections (UTIs) can develop anywhere along your urinary tract. Your urinary tract is your body's drainage system for removing wastes and extra water. Your urinary tract includes two kidneys, two ureters, a bladder, and a urethra. Your kidneys are a pair of bean-shaped organs. Each kidney is about the size of your fist. They are located below your ribs, one on each side of your  spine. CAUSES Infections are caused by microbes, which are microscopic organisms, including fungi, viruses, and bacteria. These organisms are so small that they can only be seen through a microscope. Bacteria are the microbes that most commonly cause UTIs. SYMPTOMS  Symptoms of UTIs may vary by age and gender of the patient and by the location of the infection. Symptoms in young women typically include a frequent and intense urge to urinate and a painful, burning feeling in the bladder or urethra during urination. Older women and men are more likely to be tired, shaky, and weak and have muscle aches and abdominal pain. A fever may mean the infection is in your kidneys. Other symptoms of a kidney infection include pain in your back or sides below the ribs, nausea, and vomiting. DIAGNOSIS To diagnose a UTI, your caregiver will ask you about your symptoms. Your caregiver will also ask you to provide a urine sample. The urine sample will be tested for bacteria and  white blood cells. White blood cells are made by your body to help fight infection. TREATMENT  Typically, UTIs can be treated with medication. Because most UTIs are caused by a bacterial infection, they usually can be treated with the use of antibiotics. The choice of antibiotic and length of treatment depend on your symptoms and the type of bacteria causing your infection. HOME CARE INSTRUCTIONS  If you were prescribed antibiotics, take them exactly as your caregiver instructs you. Finish the medication even if you feel better after you have only taken some of the medication.  Drink enough water and fluids to keep your urine clear or pale yellow.  Avoid caffeine, tea, and carbonated beverages. They tend to irritate your bladder.  Empty your bladder often. Avoid holding urine for long periods of time.  Empty your bladder before and after sexual intercourse.  After a bowel movement, women should cleanse from front to back. Use each tissue  only once. SEEK MEDICAL CARE IF:   You have back pain.  You develop a fever.  Your symptoms do not begin to resolve within 3 days. SEEK IMMEDIATE MEDICAL CARE IF:   You have severe back pain or lower abdominal pain.  You develop chills.  You have nausea or vomiting.  You have continued burning or discomfort with urination. MAKE SURE YOU:   Understand these instructions.  Will watch your condition.  Will get help right away if you are not doing well or get worse.   This information is not intended to replace advice given to you by your health care provider. Make sure you discuss any questions you have with your health care provider.   Document Released: 05/12/2005 Document Revised: 04/23/2015 Document Reviewed: 09/10/2011 Elsevier Interactive Patient Education [employer] Else[employer] Inc.

## 2015-11-30 NOTE — ED Notes (Signed)
Pt given d/c instructions as per chart. Verbalizes understanding. No questions. Rx x 1 

## 2015-11-30 NOTE — ED Notes (Signed)
Up to b/r, steady gait 

## 2015-11-30 NOTE — ED Notes (Signed)
Alert, NAD, calm, interactive, reports pain remains, denies nausea, talking on cell phone, watching TV.

## 2015-11-30 NOTE — ED Notes (Signed)
Pt c/o body aches, H/A sore throat onset last pm.

## 2015-11-30 NOTE — ED Notes (Signed)
CBG 151. 

## 2015-11-30 NOTE — ED Notes (Signed)
Back from b/r, steady gait.  °

## 2015-12-03 LAB — URINE CULTURE: Culture: >=100000 — AB

## 2015-12-03 LAB — CULTURE, GROUP A STREP (THRC)

## 2015-12-04 ENCOUNTER — Telehealth: Payer: Self-pay | Admitting: *Deleted

## 2015-12-04 NOTE — ED Notes (Signed)
Post ED Visit - Positive Culture Follow-up  Culture report reviewed by antimicrobial stewardship pharmacist:  []  Enzo BiNathan Batchelder, Pharm.D. []  Celedonio MiyamotoJeremy Frens, Pharm.D., BCPS []  Garvin FilaMike Maccia, Pharm.D. []  Georgina PillionElizabeth Martin, Pharm.D., BCPS []  HarlanMinh Pham, [address]Ph[address]D., BCPS, AAHIVP [x]  Estella HuskMichelle Turner, Pharm.D., BCPS, AAHIVP []  Tennis Mustassie Stewart, Pharm.D. []  Sherle Poeob Vincent, [address]Pharm[address]  Positive urine culture Treated with Sulfamethoxazole-Trimethoprim, organism sensitive to the same and no further patient follow-up is required at this time.  Virl AxeRobertson, Tomi Grandpre [facility] 12/04/2015, 11:21 AM

## 2015-12-20 ENCOUNTER — Emergency Department (HOSPITAL_BASED_OUTPATIENT_CLINIC_OR_DEPARTMENT_OTHER)
Admission: EM | Admit: 2015-12-20 | Discharge: 2015-12-21 | Disposition: A | Discharge status: Home / Self Care | Payer: Medicaid Other | Attending: Emergency Medicine | Admitting: Emergency Medicine

## 2015-12-20 ENCOUNTER — Encounter (HOSPITAL_BASED_OUTPATIENT_CLINIC_OR_DEPARTMENT_OTHER): Payer: Self-pay | Admitting: Emergency Medicine

## 2015-12-20 DIAGNOSIS — F17200 Nicotine dependence, unspecified, uncomplicated: Secondary | ICD-10-CM | POA: Insufficient documentation

## 2015-12-20 DIAGNOSIS — Z794 Long term (current) use of insulin: Secondary | ICD-10-CM | POA: Diagnosis not present

## 2015-12-20 DIAGNOSIS — E1010 Type 1 diabetes mellitus with ketoacidosis without coma: Secondary | ICD-10-CM | POA: Insufficient documentation

## 2015-12-20 DIAGNOSIS — E1065 Type 1 diabetes mellitus with hyperglycemia: Secondary | ICD-10-CM | POA: Insufficient documentation

## 2015-12-20 DIAGNOSIS — R739 Hyperglycemia, unspecified: Secondary | ICD-10-CM

## 2015-12-20 LAB — CBC WITH DIFFERENTIAL/PLATELET
BASOS ABS: 0 10*3/uL (ref 0.0–0.1)
BASOS PCT: 0 %
EOS ABS: 0.1 10*3/uL (ref 0.0–0.7)
EOS PCT: 1 %
HCT: 36.3 % (ref 36.0–46.0)
Hemoglobin: 11.6 g/dL — ABNORMAL LOW (ref 12.0–15.0)
Lymphocytes Relative: 21 %
Lymphs Abs: 1.1 10*3/uL (ref 0.7–4.0)
MCH: 24.4 pg — AB (ref 26.0–34.0)
MCHC: 32 g/dL (ref 30.0–36.0)
MCV: 76.3 fL — ABNORMAL LOW (ref 78.0–100.0)
Monocytes Absolute: 0.2 10*3/uL (ref 0.1–1.0)
Monocytes Relative: 4 %
Neutro Abs: 3.9 10*3/uL (ref 1.7–7.7)
Neutrophils Relative %: 74 %
PLATELETS: 292 10*3/uL (ref 150–400)
RBC: 4.76 MIL/uL (ref 3.87–5.11)
RDW: 15.3 % (ref 11.5–15.5)
WBC: 5.3 10*3/uL (ref 4.0–10.5)

## 2015-12-20 LAB — I-STAT VENOUS BLOOD GAS, ED
Acid-base deficit: 1 mmol/L (ref 0.0–2.0)
BICARBONATE: 25.1 meq/L — AB (ref 20.0–24.0)
O2 Saturation: 65 %
Patient temperature: 98.6
TCO2: 26 mmol/L (ref 0–100)
pCO2, Ven: 46.4 mmHg (ref 45.0–50.0)
pH, Ven: 7.34 — ABNORMAL HIGH (ref 7.250–7.300)
pO2, Ven: 36 mmHg (ref 31.0–45.0)

## 2015-12-20 LAB — URINALYSIS, ROUTINE W REFLEX MICROSCOPIC
Bilirubin Urine: NEGATIVE
Glucose, UA: >1000 mg/dL — AB
Hgb urine dipstick: NEGATIVE
Ketones, ur: 15 mg/dL — AB
LEUKOCYTES UA: NEGATIVE
NITRITE: NEGATIVE
PH: 6 (ref 5.0–8.0)
Protein, ur: NEGATIVE mg/dL
SPECIFIC GRAVITY, URINE: 1.031 — AB (ref 1.005–1.030)

## 2015-12-20 LAB — CBG MONITORING, ED

## 2015-12-20 LAB — COMPREHENSIVE METABOLIC PANEL
ALT: 19 U/L (ref 14–54)
ANION GAP: 8 (ref 5–15)
AST: 24 U/L (ref 15–41)
Albumin: 3.9 g/dL (ref 3.5–5.0)
Alkaline Phosphatase: 102 U/L (ref 38–126)
BUN: 12 mg/dL (ref 6–20)
CHLORIDE: 98 mmol/L — AB (ref 101–111)
CO2: 23 mmol/L (ref 22–32)
Calcium: 8.8 mg/dL — ABNORMAL LOW (ref 8.9–10.3)
Creatinine, Ser: 0.77 mg/dL (ref 0.44–1.00)
GFR calc non Af Amer: >60 mL/min (ref >60)
Glucose, Bld: 741 mg/dL (ref 65–99)
POTASSIUM: 4.5 mmol/L (ref 3.5–5.1)
SODIUM: 129 mmol/L — AB (ref 135–145)
Total Bilirubin: 0.8 mg/dL (ref 0.3–1.2)
Total Protein: 7.6 g/dL (ref 6.5–8.1)

## 2015-12-20 LAB — URINE MICROSCOPIC-ADD ON
RBC / HPF: NONE SEEN RBC/hpf (ref 0–5)
WBC, UA: NONE SEEN WBC/hpf (ref 0–5)

## 2015-12-20 LAB — PREGNANCY, URINE: Preg Test, Ur: NEGATIVE

## 2015-12-20 MED ORDER — INSULIN ASPART PROT & ASPART (70-30 MIX) 100 UNIT/ML ~~LOC~~ SUSP: 40.0000 [IU] | Freq: Two times a day (BID) | SUBCUTANEOUS | Status: DC

## 2015-12-20 MED ORDER — SODIUM CHLORIDE 0.9 % IV BOLUS (SEPSIS)
1000.0000 mL | Freq: Once | INTRAVENOUS | Status: AC
  Administered 2015-12-20: 1000 mL via INTRAVENOUS

## 2015-12-20 MED ORDER — INSULIN ASPART 100 UNIT/ML ~~LOC~~ SOLN
12.0000 [IU] | Freq: Once | SUBCUTANEOUS | Status: AC
  Administered 2015-12-20: 12 [IU] via SUBCUTANEOUS
  Filled 2015-12-20: qty 1

## 2015-12-20 MED ORDER — SODIUM CHLORIDE 0.9 % IV SOLN: Freq: Once | INTRAVENOUS | Status: DC

## 2015-12-20 MED ORDER — SODIUM CHLORIDE 0.9 % IV BOLUS (SEPSIS)
1000.0000 mL | Freq: Once | INTRAVENOUS | Status: AC
  Administered 2015-12-21: 1000 mL via INTRAVENOUS

## 2015-12-20 NOTE — ED Notes (Signed)
Lab called and states pt's blood sugar is 741. PA aware. No further orders received.

## 2015-12-20 NOTE — ED Notes (Signed)
CBG is reading HI. (Over 600)

## 2015-12-20 NOTE — ED Provider Notes (Signed)
CSN: [id number]     Arrival date & time 12/20/15  2224 History   First MD Initiated Contact with Patient 12/20/15 2236     Chief Complaint  Patient presents with  . Hyperglycemia   Patient is a 25 y.o. female presenting with hyperglycemia.  Hyperglycemia Blood sugar level PTA:  > 600 Diabetes status:  Controlled with insulin Current diabetic therapy:  Novolin 70/30 Time since last antidiabetic medication:  1 day Context: noncompliance   Associated symptoms: dehydration, increased thirst and polyuria   Associated symptoms: no abdominal pain, no altered mental status, no blurred vision, no chest pain, no confusion, no dizziness, no dysuria, no fatigue, no fever, no increased appetite, no malaise, no nausea, no shortness of breath, no syncope, no vomiting and no weakness   Risk factors: hx of DKA    [name] is 25 year old female with PMHx of DM and noncompliance presenting with hyperglycemia. Pt states she takes novolin 70/30 which she buys OTC because she does not have a PCP. She ran out of insulin yesterday and was not able to go to a pharmacy to buy more. She admits to drinking alcohol and partying last evening. She does not regularly check her blood sugar but notes it was 330 before leaving last night. She states this is where her blood sugar typically runs. She endorses increased thirst and urination. She states that she has been in DKA before and does not think she is currently in DKA. Denies fevers, chills, URI symptoms, blurred vision, dizziness, syncope, chest pain, SOB, abdominal pain, nausea, vomiting, myalgias or fatigue.   Past Medical History  Diagnosis Date  . 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 ALLERGIC RHINITIS 05/07/2010  . DKA (diabetic ketoacidoses) (HCC)   . Diabetes mellitus without complication [facility](HCC)    Past Surgical History  Procedure Laterality Date  . Tonsillectomy     Family History  Problem Relation Age of Onset   . Diabetes Neg Hx   . Other Mother   . Other Father    Social History  Substance Use Topics  . Smoking status: Current Every Day Smoker  . Smokeless tobacco: None  . Alcohol Use: No   OB History    No data available     Review of Systems  Constitutional: Negative for fever and fatigue.  Eyes: Negative for blurred vision.  Respiratory: Negative for shortness of breath.   Cardiovascular: Negative for chest pain and syncope.  Gastrointestinal: Negative for nausea, vomiting and abdominal pain.  Endocrine: Positive for polydipsia and polyuria.  Genitourinary: Negative for dysuria.  Neurological: Negative for dizziness and weakness.  Psychiatric/Behavioral: Negative for confusion.  All other systems reviewed and are negative.     Allergies  Penicillins; Banana; Orange concentrate; and Penicillins  Home Medications   Prior to Admission medications   Medication Sig Start Date End Date Taking? Authorizing Provider  cetirizine (ZYRTEC) 10 MG tablet Take 10 mg by mouth daily.      Historical Provider, MD  guaiFENesin-codeine 100-10 MG/5ML syrup Take 5 mLs by mouth every 8 (eight) hours as needed for cough. 12/22/13   Hannah Muthersbaugh, PA-C  hydrochlorothiazide (HYDRODIURIL) 12.5 MG tablet Take 1 tablet (12.5 mg total) by mouth daily. 11/26/15   Marily MemosJason Mesner, MD  Insulin Aspart Prot & Aspart (NOVOLOG MIX 70/30 FLEXPEN) (70-30) 100 UNIT/ML Pen Inject 40 Units into the skin 2 (two) times daily. 09/07/13   Tora KindredMarianne L York, PA-C  insulin  NPH-regular Human (NOVOLIN 70/30) (70-30) 100 UNIT/ML injection Inject 40 Units into the skin 2 (two) times daily with a meal. 12/21/15   Ivey Cina, PA-C  potassium chloride SA (K-DUR,KLOR-CON) 20 MEQ tablet Take 1 tablet (20 mEq total) by mouth 2 (two) times daily. While on the hydrochlorothiazide 11/26/15   Marily Memos, MD  pseudoephedrine (SUDAFED) 30 MG tablet Take 30 mg by mouth every 4 (four) hours as needed for congestion.    Historical Provider,  MD   BP 118/76 mmHg  Pulse 90  Temp(Src) 98.3 F (36.8 C) (Oral)  Resp 20  Ht 5\' 7"  (1.702 m)  Wt 72.576 kg  BMI 25.05 kg/m2  SpO2 100%  LMP 11/24/2015 Physical Exam  Constitutional: She is oriented to person, place, and time. She appears well-developed and well-nourished. No distress.  Nontoxic appearing  HENT:  Head: Normocephalic and atraumatic.  Mouth/Throat: Uvula is midline and oropharynx is clear and moist. Mucous membranes are dry.  Eyes: Conjunctivae and EOM are normal. Right eye exhibits no discharge. Left eye exhibits no discharge. No scleral icterus.  Neck: Normal range of motion. Neck supple.  Cardiovascular: Normal rate, regular rhythm and normal heart sounds.   Pulmonary/Chest: Effort normal and breath sounds normal. No respiratory distress. She has no wheezes. She has no rales.  Abdominal: Soft. Bowel sounds are normal. She exhibits no distension. There is no tenderness.  Musculoskeletal: Normal range of motion.  Neurological: She is alert and oriented to person, place, and time. Coordination normal.  Skin: Skin is warm and dry.  Psychiatric: She has a normal mood and affect. Her behavior is normal.  Nursing note and vitals reviewed.   ED Course  Procedures (including critical care time) Labs Review Labs Reviewed  CBC WITH DIFFERENTIAL/PLATELET - Abnormal; Notable for the following:    Hemoglobin 11.6 (*)    MCV 76.3 (*)    MCH 24.4 (*)    All other components within normal limits  COMPREHENSIVE METABOLIC PANEL - Abnormal; Notable for the following:    Sodium 129 (*)    Chloride 98 (*)    Glucose, Bld 741 (*)    Calcium 8.8 (*)    All other components within normal limits  URINALYSIS, ROUTINE W REFLEX MICROSCOPIC (NOT AT [facility]) - Abnormal; Notable for the following:    Specific Gravity, Urine 1.031 (*)    Glucose, UA >1000 (*)    Ketones, ur 15 (*)    All other components within normal limits  URINE MICROSCOPIC-ADD ON - Abnormal; Notable for the  following:    Squamous Epithelial / LPF 0-5 (*)    Bacteria, UA RARE (*)    All other components within normal limits  CBG MONITORING, ED - Abnormal; Notable for the following:    Glucose-Capillary >600 (*)    All other components within normal limits  I-STAT VENOUS BLOOD GAS, ED - Abnormal; Notable for the following:    pH, Ven 7.340 (*)    Bicarbonate 25.1 (*)    All other components within normal limits  CBG MONITORING, ED - Abnormal; Notable for the following:    Glucose-Capillary 557 (*)    All other components within normal limits  PREGNANCY, URINE  BLOOD GAS, VENOUS    Imaging Review No results found. I have personally reviewed and evaluated these images and lab results as part of my medical decision-making.   EKG Interpretation None      MDM   Final diagnoses:  Hyperglycemia   25 year old female  presenting with hyperglycemia. Pt reports running out of her insulin yesterday and drinking alcohol last evening. Hx of DKA. Afebrile and hemodynamically stable. Nontoxic appearing. Dry mucus membranes. Abdomen is soft, non-tender. No leukocytosis. Glucose 741. Na 129; likely pseudohyponatremia from hyperglycemia. No anion gap. UA with glucose and ketones; no infection. Given 12 units insulin and 2 L bolus while in ED.  12:45 - Blood glucose 557. Will give 3 units IV insulin and re-assess. Continuing to receive 2nd fluid bolus. Patient care signed out to April Palumbo, MD who will monitor blood sugar and anticipate discharge with further resolution of hyperglycemia.     Beverly Almendarez, PA-C 12/21/15 0101  Rolland Porter, MD 12/22/15 [phone]

## 2015-12-20 NOTE — Discharge Instructions (Signed)
Schedule a follow up appointment with a primary care provider. Take your insulin as instructed and check your blood sugar regularly. Your blood sugar was very high today and will need close following by a PCP to ensure good blood sugar control. Return to ED with new, worsening or concerning symptoms.    Hyperglycemia Hyperglycemia occurs when the glucose (sugar) in your blood is too high. Hyperglycemia can happen for many reasons, but it most often happens to people who do not know they have diabetes or are not managing their diabetes properly.  CAUSES  Whether you have diabetes or not, there are other causes of hyperglycemia. Hyperglycemia can occur when you have diabetes, but it can also occur in other situations that you might not be as aware of, such as: Diabetes  If you have diabetes and are having problems controlling your blood glucose, hyperglycemia could occur because of some of the following reasons:  Not following your meal plan.  Not taking your diabetes medications or not taking it properly.  Exercising less or doing less activity than you normally do.  Being sick. Pre-diabetes  This cannot be ignored. Before people develop Type 2 diabetes, they almost always have "pre-diabetes." This is when your blood glucose levels are higher than normal, but not yet high enough to be diagnosed as diabetes. Research has shown that some long-term damage to the body, especially the heart and circulatory system, may already be occurring during pre-diabetes. If you take action to manage your blood glucose when you have pre-diabetes, you may delay or prevent Type 2 diabetes from developing. Stress  If you have diabetes, you may be "diet" controlled or on oral medications or insulin to control your diabetes. However, you may find that your blood glucose is higher than usual in the hospital whether you have diabetes or not. This is often referred to as "stress hyperglycemia." Stress can elevate your  blood glucose. This happens because of hormones put out by the body during times of stress. If stress has been the cause of your high blood glucose, it can be followed regularly by your caregiver. That way he/she can make sure your hyperglycemia does not continue to get worse or progress to diabetes. Steroids  Steroids are medications that act on the infection fighting system (immune system) to block inflammation or infection. One side effect can be a rise in blood glucose. Most people can produce enough extra insulin to allow for this rise, but for those who cannot, steroids make blood glucose levels go even higher. It is not unusual for steroid treatments to "uncover" diabetes that is developing. It is not always possible to determine if the hyperglycemia will go away after the steroids are stopped. A special blood test called an A1c is sometimes done to determine if your blood glucose was elevated before the steroids were started. SYMPTOMS  Thirsty.  Frequent urination.  Dry mouth.  Blurred vision.  Tired or fatigue.  Weakness.  Sleepy.  Tingling in feet or leg. DIAGNOSIS  Diagnosis is made by monitoring blood glucose in one or all of the following ways:  A1c test. This is a chemical found in your blood.  Fingerstick blood glucose monitoring.  Laboratory results. TREATMENT  First, knowing the cause of the hyperglycemia is important before the hyperglycemia can be treated. Treatment may include, but is not be limited to:  Education.  Change or adjustment in medications.  Change or adjustment in meal plan.  Treatment for an illness, infection, etc.  More  frequent blood glucose monitoring.  Change in exercise plan.  Decreasing or stopping steroids.  Lifestyle changes. HOME CARE INSTRUCTIONS   Test your blood glucose as directed.  Exercise regularly. Your caregiver will give you instructions about exercise. Pre-diabetes or diabetes which comes on with stress is  helped by exercising.  Eat wholesome, balanced meals. Eat often and at regular, fixed times. Your caregiver or nutritionist will give you a meal plan to guide your sugar intake.  Being at an ideal weight is important. If needed, losing as little as 10 to 15 pounds may help improve blood glucose levels. SEEK MEDICAL CARE IF:   You have questions about medicine, activity, or diet.  You continue to have symptoms (problems such as increased thirst, urination, or weight gain). SEEK IMMEDIATE MEDICAL CARE IF:   You are vomiting or have diarrhea.  Your breath smells fruity.  You are breathing faster or slower.  You are very sleepy or incoherent.  You have numbness, tingling, or pain in your feet or hands.  You have chest pain.  Your symptoms get worse even though you have been following your caregiver's orders.  If you have any other questions or concerns.   This information is not intended to replace advice given to you by your health care provider. Make sure you discuss any questions you have with your health care provider.   Document Released: 01/26/2001 Document Revised: 10/25/2011 Document Reviewed: 04/08/2015 Elsevier Interactive Patient Education [employer]

## 2015-12-20 NOTE — ED Notes (Signed)
Patient states that she ran out of her 70/30 insulin. She took her last dose last night just prior to going out and partying. The patient reports that she has not felt well all day but does not think she is in dka

## 2015-12-21 LAB — CBG MONITORING, ED
GLUCOSE-CAPILLARY: 502 mg/dL — AB (ref 65–99)
GLUCOSE-CAPILLARY: 463 mg/dL — AB (ref 65–99)
Glucose-Capillary: 557 mg/dL (ref 65–99)

## 2015-12-21 MED ORDER — INSULIN ASPART 100 UNIT/ML ~~LOC~~ SOLN
3.0000 [IU] | Freq: Once | SUBCUTANEOUS | Status: AC
  Administered 2015-12-21: 3 [IU] via INTRAVENOUS
  Filled 2015-12-21: qty 1

## 2015-12-21 MED ORDER — INSULIN NPH ISOPHANE & REGULAR (70-30) 100 UNIT/ML ~~LOC~~ SUSP: 40.0000 [IU] | Freq: Two times a day (BID) | SUBCUTANEOUS | Status: DC

## 2015-12-21 MED ORDER — INSULIN ASPART 100 UNIT/ML ~~LOC~~ SOLN
4.0000 [IU] | Freq: Once | SUBCUTANEOUS | Status: AC
Start: 2015-12-21 — End: 2015-12-21
  Administered 2015-12-21: 4 [IU] via INTRAVENOUS
  Filled 2015-12-21: qty 1

## 2015-12-21 MED ORDER — INSULIN ASPART 100 UNIT/ML ~~LOC~~ SOLN: 3.0000 [IU] | Freq: Once | SUBCUTANEOUS | Status: DC

## 2015-12-21 NOTE — ED Notes (Signed)
CBG is 557.

## 2015-12-27 ENCOUNTER — Encounter (HOSPITAL_COMMUNITY): Payer: Self-pay | Admitting: Emergency Medicine

## 2015-12-27 ENCOUNTER — Emergency Department (HOSPITAL_COMMUNITY)
Admission: EM | Admit: 2015-12-27 | Discharge: 2015-12-27 | Disposition: A | Discharge status: Home / Self Care | Payer: Medicaid Other | Attending: Emergency Medicine | Admitting: Emergency Medicine

## 2015-12-27 ENCOUNTER — Emergency Department (HOSPITAL_COMMUNITY): Payer: Medicaid Other

## 2015-12-27 DIAGNOSIS — Y939 Activity, unspecified: Secondary | ICD-10-CM | POA: Diagnosis not present

## 2015-12-27 DIAGNOSIS — Z5321 Procedure and treatment not carried out due to patient leaving prior to being seen by health care provider: Secondary | ICD-10-CM | POA: Insufficient documentation

## 2015-12-27 DIAGNOSIS — Y999 Unspecified external cause status: Secondary | ICD-10-CM | POA: Insufficient documentation

## 2015-12-27 DIAGNOSIS — M25571 Pain in right ankle and joints of right foot: Secondary | ICD-10-CM | POA: Diagnosis present

## 2015-12-27 DIAGNOSIS — E1310 Other specified diabetes mellitus with ketoacidosis without coma: Secondary | ICD-10-CM | POA: Diagnosis not present

## 2015-12-27 DIAGNOSIS — Y92410 Unspecified street and highway as the place of occurrence of the external cause: Secondary | ICD-10-CM | POA: Insufficient documentation

## 2015-12-27 DIAGNOSIS — Z7982 Long term (current) use of aspirin: Secondary | ICD-10-CM | POA: Insufficient documentation

## 2015-12-27 NOTE — ED Notes (Signed)
Patient left without signing.  Left AMA.

## 2015-12-27 NOTE — ED Notes (Signed)
X-ray at bedside
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
## 2016-02-09 ENCOUNTER — Encounter (HOSPITAL_BASED_OUTPATIENT_CLINIC_OR_DEPARTMENT_OTHER): Payer: Self-pay

## 2016-02-09 ENCOUNTER — Emergency Department (HOSPITAL_BASED_OUTPATIENT_CLINIC_OR_DEPARTMENT_OTHER)
Admission: EM | Admit: 2016-02-09 | Discharge: 2016-02-10 | Disposition: A | Discharge status: Home / Self Care | Payer: Medicaid Other | Attending: Emergency Medicine | Admitting: Emergency Medicine

## 2016-02-09 DIAGNOSIS — R109 Unspecified abdominal pain: Secondary | ICD-10-CM | POA: Diagnosis not present

## 2016-02-09 DIAGNOSIS — R739 Hyperglycemia, unspecified: Secondary | ICD-10-CM

## 2016-02-09 DIAGNOSIS — Z794 Long term (current) use of insulin: Secondary | ICD-10-CM | POA: Insufficient documentation

## 2016-02-09 DIAGNOSIS — R112 Nausea with vomiting, unspecified: Secondary | ICD-10-CM | POA: Diagnosis not present

## 2016-02-09 DIAGNOSIS — E1165 Type 2 diabetes mellitus with hyperglycemia: Secondary | ICD-10-CM | POA: Diagnosis present

## 2016-02-09 LAB — BASIC METABOLIC PANEL
Anion gap: 14 (ref 5–15)
BUN: 22 mg/dL — AB (ref 6–20)
CHLORIDE: 99 mmol/L — AB (ref 101–111)
CO2: 19 mmol/L — AB (ref 22–32)
Calcium: 9.1 mg/dL (ref 8.9–10.3)
Creatinine, Ser: 0.93 mg/dL (ref 0.44–1.00)
GFR calc Af Amer: >60 mL/min (ref >60)
GFR calc non Af Amer: >60 mL/min (ref >60)
Glucose, Bld: 488 mg/dL — ABNORMAL HIGH (ref 65–99)
POTASSIUM: 4.4 mmol/L (ref 3.5–5.1)
SODIUM: 132 mmol/L — AB (ref 135–145)

## 2016-02-09 LAB — CBC WITH DIFFERENTIAL/PLATELET
BASOS ABS: 0 10*3/uL (ref 0.0–0.1)
BASOS PCT: 0 %
EOS ABS: 0.1 10*3/uL (ref 0.0–0.7)
Eosinophils Relative: 1 %
HCT: 39.1 % (ref 36.0–46.0)
HEMOGLOBIN: 12.9 g/dL (ref 12.0–15.0)
LYMPHS PCT: 10 %
Lymphs Abs: 0.7 10*3/uL (ref 0.7–4.0)
MCH: 24.4 pg — AB (ref 26.0–34.0)
MCHC: 33 g/dL (ref 30.0–36.0)
MCV: 73.9 fL — AB (ref 78.0–100.0)
Monocytes Absolute: 0.4 10*3/uL (ref 0.1–1.0)
Monocytes Relative: 6 %
NEUTROS PCT: 83 %
Neutro Abs: 5.6 10*3/uL (ref 1.7–7.7)
Platelets: 259 10*3/uL (ref 150–400)
RBC: 5.29 MIL/uL — ABNORMAL HIGH (ref 3.87–5.11)
RDW: 14 % (ref 11.5–15.5)
WBC: 6.8 10*3/uL (ref 4.0–10.5)

## 2016-02-09 LAB — CBG MONITORING, ED
GLUCOSE-CAPILLARY: 441 mg/dL — AB (ref 65–99)
GLUCOSE-CAPILLARY: 398 mg/dL — AB (ref 65–99)

## 2016-02-09 MED ORDER — SODIUM CHLORIDE 0.9 % IV BOLUS (SEPSIS)
1000.0000 mL | Freq: Once | INTRAVENOUS | Status: AC
  Administered 2016-02-09: 1000 mL via INTRAVENOUS

## 2016-02-09 MED ORDER — INSULIN ASPART 100 UNIT/ML ~~LOC~~ SOLN
10.0000 [IU] | Freq: Once | SUBCUTANEOUS | Status: AC
  Administered 2016-02-09: 10 [IU] via INTRAVENOUS
  Filled 2016-02-09: qty 1

## 2016-02-09 NOTE — ED Notes (Signed)
C/o vomiting x 4-5 today  No distress noted  Also having abd pain  She states that she is on period causing abd pain

## 2016-02-09 NOTE — ED Notes (Signed)
Vomiting x today-elevated BS-NAD-steady gait

## 2016-02-09 NOTE — ED Provider Notes (Signed)
CSN: [id number]     Arrival date & time 02/09/16  2145 History  By signing my name below, I, Beverly Wells, attest that this documentation has been prepared under the direction and in the presence of Beverly Batonourtney F Horton, MD. Electronically Signed: Placido SouLogan Wells, ED Scribe. 02/09/2016. 11:34 PM.   Chief Complaint  Patient presents with  . Emesis   The history is provided by the patient. No language interpreter was used.    HPI Comments: Beverly Wells is a 25 y.o. female with a PMHx of IDDM and DKA who presents to the Emergency Department complaining of moderate n/v onset this morning. Pt states she is on day 2 of a 3 day course of 40 mg prednisone which caused her blood glucose levels to spike. She reports associated, moderate, nausea and 5 x vomiting today. She additionally reports mild, diffuse, abd pain noting she is currently on her menstrual cycle and confirms her pain is consistent with past menstrual cycles. She takes 45 units of 70/30 2x per day for her IDDM. Pt denies any possibility of being pregnant. She denies a hx of smoking or ETOH consumption. She denies fevers and chills.   Past Medical History  Diagnosis Date  . 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 ALLERGIC RHINITIS 05/07/2010  . DKA (diabetic ketoacidoses) (HCC)   . Diabetes mellitus without complication [facility](HCC)    Past Surgical History  Procedure Laterality Date  . Tonsillectomy     Family History  Problem Relation Age of Onset  . Diabetes Neg Hx   . Other Mother   . Other Father    Social History  Substance Use Topics  . Smoking status: Never Smoker   . Smokeless tobacco: None  . Alcohol Use: No   OB History    No data available     Review of Systems  Constitutional: Negative for fever and chills.  Respiratory: Negative for shortness of breath.   Cardiovascular: Negative for chest pain.  Gastrointestinal: Positive for nausea, vomiting and abdominal pain.   All other systems reviewed and are negative.   Allergies  Penicillins; Banana; Orange concentrate; Peanut butter flavor; and Penicillins  Home Medications   Prior to Admission medications   Medication Sig Start Date End Date Taking? Authorizing Provider  cetirizine (ZYRTEC) 10 MG tablet Take 10 mg by mouth daily.      Historical Provider, MD  Insulin Aspart Prot & Aspart (NOVOLOG MIX 70/30 FLEXPEN) (70-30) 100 UNIT/ML Pen Inject 40 Units into the skin 2 (two) times daily. Patient not taking: Reported on 12/27/2015 09/07/13   Stephani PoliceMarianne L York, PA-C  insulin NPH-regular Human (NOVOLIN 70/30) (70-30) 100 UNIT/ML injection Inject 40 Units into the skin 2 (two) times daily with a meal. 12/21/15   Stevi Barrett, PA-C   BP 109/80 mmHg  Pulse 83  Temp(Src) 98.7 F (37.1 C) (Oral)  Resp 18  Ht 5\' 7"  (1.702 m)  Wt 168 lb (76.204 kg)  BMI 26.31 kg/m2  SpO2 99%  LMP 02/07/2016 Physical Exam  Constitutional: She is oriented to person, place, and time. She appears well-developed and well-nourished.  HENT:  Head: Normocephalic and atraumatic.  Eyes: Pupils are equal, round, and reactive to light.  Cardiovascular: Normal rate, regular rhythm and normal heart sounds.   No murmur heard. Pulmonary/Chest: Effort normal and breath sounds normal. No respiratory distress. She has no wheezes.  Abdominal: Soft. Bowel sounds are normal. There is no tenderness. There is  no rebound.  Neurological: She is alert and oriented to person, place, and time.  Skin: Skin is warm and dry.  Psychiatric: She has a normal mood and affect.  Nursing note and vitals reviewed.   ED Course  Procedures  DIAGNOSTIC STUDIES: Oxygen Saturation is 97% on RA, normal by my interpretation.    COORDINATION OF CARE: 11:09 PM Discussed next steps with pt. Pt verbalized understanding and is agreeable with the plan.   Labs Review Labs Reviewed  URINALYSIS, ROUTINE W REFLEX MICROSCOPIC (NOT AT [facility]) - Abnormal; Notable for the  following:    Specific Gravity, Urine 1.040 (*)    Glucose, UA >1000 (*)    Hgb urine dipstick MODERATE (*)    Ketones, ur >80 (*)    All other components within normal limits  CBC WITH DIFFERENTIAL/PLATELET - Abnormal; Notable for the following:    RBC 5.29 (*)    MCV 73.9 (*)    MCH 24.4 (*)    All other components within normal limits  BASIC METABOLIC PANEL - Abnormal; Notable for the following:    Sodium 132 (*)    Chloride 99 (*)    CO2 19 (*)    Glucose, Bld 488 (*)    BUN 22 (*)    All other components within normal limits  URINE MICROSCOPIC-ADD ON - Abnormal; Notable for the following:    Squamous Epithelial / LPF 0-5 (*)    All other components within normal limits  CBG MONITORING, ED - Abnormal; Notable for the following:    Glucose-Capillary 441 (*)    All other components within normal limits  CBG MONITORING, ED - Abnormal; Notable for the following:    Glucose-Capillary 398 (*)    All other components within normal limits  CBG MONITORING, ED - Abnormal; Notable for the following:    Glucose-Capillary 321 (*)    All other components within normal limits  PREGNANCY, URINE    Imaging Review No results found. I have personally reviewed and evaluated these images and lab results as part of my medical decision-making.   EKG Interpretation None      MDM   Final diagnoses:  Hyperglycemia  Non-intractable vomiting with nausea, vomiting of unspecified type    Patient presents with vomiting and hyperglycemia home. Patient reports her glucometer has been reading high. History of DKA. Currently on prednisone for "allergies." She was unaware that this increases blood sugar. Initially tachycardic to 124. Otherwise vital signs stable. Physical exam is benign. She is nontender on exam. Lab work notable for blood glucose of 488, anion gap 14, CO2 19. Borderline but no obvious DKA.Marland Kitchen.  She does have ketones in the urine. She was given 2 L of fluid and 10 units of insulin.  Repeat blood sugar is 321. She is able to tolerate fluids. Will discharge home. Discontinue prednisone use. Continue to monitor blood sugars closely.  After history, exam, and medical workup I feel the patient has been appropriately medically screened and is safe for discharge home. Pertinent diagnoses were discussed with the patient. Patient was given return precautions.  I personally performed the services described in this documentation, which was scribed in my presence. The recorded information has been reviewed and is accurate.     Beverly Batonourtney F Horton, MD 02/10/16 [phone]

## 2016-02-09 NOTE — ED Notes (Signed)
Pt still unable to give UA

## 2016-02-09 NOTE — ED Notes (Signed)
Pt unable to give UA at this time 

## 2016-02-10 LAB — URINALYSIS, ROUTINE W REFLEX MICROSCOPIC
Bilirubin Urine: NEGATIVE
Glucose, UA: >1000 mg/dL — AB
LEUKOCYTES UA: NEGATIVE
NITRITE: NEGATIVE
PH: 5.5 (ref 5.0–8.0)
Protein, ur: NEGATIVE mg/dL
Specific Gravity, Urine: 1.04 — ABNORMAL HIGH (ref 1.005–1.030)

## 2016-02-10 LAB — URINE MICROSCOPIC-ADD ON: Bacteria, UA: NONE SEEN

## 2016-02-10 LAB — PREGNANCY, URINE: Preg Test, Ur: NEGATIVE

## 2016-02-10 LAB — CBG MONITORING, ED: Glucose-Capillary: 321 mg/dL — ABNORMAL HIGH (ref 65–99)

## 2016-02-10 NOTE — Discharge Instructions (Signed)
You were seen today and found to have high blood sugar. This is likely related to the prednisone you were taking. Discontinue use of prednisone and continue to monitor your blood sugars closely at home. He has no evidence of DKA at this time.  Hyperglycemia Hyperglycemia occurs when the glucose (sugar) in your blood is too high. Hyperglycemia can happen for many reasons, but it most often happens to people who do not know they have diabetes or are not managing their diabetes properly.  CAUSES  Whether you have diabetes or not, there are other causes of hyperglycemia. Hyperglycemia can occur when you have diabetes, but it can also occur in other situations that you might not be as aware of, such as: Diabetes  If you have diabetes and are having problems controlling your blood glucose, hyperglycemia could occur because of some of the following reasons:  Not following your meal plan.  Not taking your diabetes medications or not taking it properly.  Exercising less or doing less activity than you normally do.  Being sick. Pre-diabetes  This cannot be ignored. Before people develop Type 2 diabetes, they almost always have "pre-diabetes." This is when your blood glucose levels are higher than normal, but not yet high enough to be diagnosed as diabetes. Research has shown that some long-term damage to the body, especially the heart and circulatory system, may already be occurring during pre-diabetes. If you take action to manage your blood glucose when you have pre-diabetes, you may delay or prevent Type 2 diabetes from developing. Stress  If you have diabetes, you may be "diet" controlled or on oral medications or insulin to control your diabetes. However, you may find that your blood glucose is higher than usual in the hospital whether you have diabetes or not. This is often referred to as "stress hyperglycemia." Stress can elevate your blood glucose. This happens because of hormones put out by the  body during times of stress. If stress has been the cause of your high blood glucose, it can be followed regularly by your caregiver. That way he/she can make sure your hyperglycemia does not continue to get worse or progress to diabetes. Steroids  Steroids are medications that act on the infection fighting system (immune system) to block inflammation or infection. One side effect can be a rise in blood glucose. Most people can produce enough extra insulin to allow for this rise, but for those who cannot, steroids make blood glucose levels go even higher. It is not unusual for steroid treatments to "uncover" diabetes that is developing. It is not always possible to determine if the hyperglycemia will go away after the steroids are stopped. A special blood test called an A1c is sometimes done to determine if your blood glucose was elevated before the steroids were started. SYMPTOMS  Thirsty.  Frequent urination.  Dry mouth.  Blurred vision.  Tired or fatigue.  Weakness.  Sleepy.  Tingling in feet or leg. DIAGNOSIS  Diagnosis is made by monitoring blood glucose in one or all of the following ways:  A1c test. This is a chemical found in your blood.  Fingerstick blood glucose monitoring.  Laboratory results. TREATMENT  First, knowing the cause of the hyperglycemia is important before the hyperglycemia can be treated. Treatment may include, but is not be limited to:  Education.  Change or adjustment in medications.  Change or adjustment in meal plan.  Treatment for an illness, infection, etc.  More frequent blood glucose monitoring.  Change in exercise plan.  Decreasing or stopping steroids.  Lifestyle changes. HOME CARE INSTRUCTIONS   Test your blood glucose as directed.  Exercise regularly. Your caregiver will give you instructions about exercise. Pre-diabetes or diabetes which comes on with stress is helped by exercising.  Eat wholesome, balanced meals. Eat often  and at regular, fixed times. Your caregiver or nutritionist will give you a meal plan to guide your sugar intake.  Being at an ideal weight is important. If needed, losing as little as 10 to 15 pounds may help improve blood glucose levels. SEEK MEDICAL CARE IF:   You have questions about medicine, activity, or diet.  You continue to have symptoms (problems such as increased thirst, urination, or weight gain). SEEK IMMEDIATE MEDICAL CARE IF:   You are vomiting or have diarrhea.  Your breath smells fruity.  You are breathing faster or slower.  You are very sleepy or incoherent.  You have numbness, tingling, or pain in your feet or hands.  You have chest pain.  Your symptoms get worse even though you have been following your caregiver's orders.  If you have any other questions or concerns.   This information is not intended to replace advice given to you by your health care provider. Make sure you discuss any questions you have with your health care provider.   Document Released: 01/26/2001 Document Revised: 10/25/2011 Document Reviewed: 04/08/2015 Elsevier Interactive Patient Education [employer]2016 [employer] Inc.

## 2016-03-21 ENCOUNTER — Encounter (HOSPITAL_BASED_OUTPATIENT_CLINIC_OR_DEPARTMENT_OTHER): Payer: Self-pay | Admitting: *Deleted

## 2016-03-21 ENCOUNTER — Emergency Department (HOSPITAL_BASED_OUTPATIENT_CLINIC_OR_DEPARTMENT_OTHER)
Admission: EM | Admit: 2016-03-21 | Discharge: 2016-03-21 | Disposition: A | Discharge status: Home / Self Care | Payer: Medicaid Other | Attending: Dermatology | Admitting: Dermatology

## 2016-03-21 DIAGNOSIS — E119 Type 2 diabetes mellitus without complications: Secondary | ICD-10-CM | POA: Diagnosis not present

## 2016-03-21 DIAGNOSIS — Z5321 Procedure and treatment not carried out due to patient leaving prior to being seen by health care provider: Secondary | ICD-10-CM | POA: Insufficient documentation

## 2016-03-21 DIAGNOSIS — K0889 Other specified disorders of teeth and supporting structures: Secondary | ICD-10-CM | POA: Diagnosis not present

## 2016-03-21 DIAGNOSIS — Z794 Long term (current) use of insulin: Secondary | ICD-10-CM | POA: Insufficient documentation

## 2016-03-21 NOTE — ED Notes (Signed)
PT CALLED IN WAITING ROOM X3 MORE TIMES, NO ANSWER.

## 2016-03-21 NOTE — ED Triage Notes (Signed)
Patient states the cap came off of her tooth on the left lower side and is having pain.

## 2016-03-21 NOTE — ED Notes (Signed)
PT CALLED IN WAITING ROOM X3, NO ANSWER AT THIS TIME.

## 2016-04-16 ENCOUNTER — Emergency Department (HOSPITAL_COMMUNITY)
Admission: EM | Admit: 2016-04-16 | Discharge: 2016-04-16 | Disposition: A | Discharge status: Home / Self Care | Payer: Medicaid Other | Attending: Emergency Medicine | Admitting: Emergency Medicine

## 2016-04-16 ENCOUNTER — Encounter (HOSPITAL_COMMUNITY): Payer: Self-pay

## 2016-04-16 ENCOUNTER — Emergency Department (HOSPITAL_COMMUNITY): Payer: Medicaid Other

## 2016-04-16 DIAGNOSIS — R05 Cough: Secondary | ICD-10-CM

## 2016-04-16 DIAGNOSIS — F17210 Nicotine dependence, cigarettes, uncomplicated: Secondary | ICD-10-CM | POA: Diagnosis not present

## 2016-04-16 DIAGNOSIS — E109 Type 1 diabetes mellitus without complications: Secondary | ICD-10-CM | POA: Insufficient documentation

## 2016-04-16 DIAGNOSIS — Z91010 Allergy to peanuts: Secondary | ICD-10-CM | POA: Insufficient documentation

## 2016-04-16 DIAGNOSIS — R059 Cough, unspecified: Secondary | ICD-10-CM

## 2016-04-16 MED ORDER — BENZONATATE 100 MG PO CAPS
100.0000 mg | ORAL_CAPSULE | Freq: Once | ORAL | Status: AC
  Administered 2016-04-16: 100 mg via ORAL
  Filled 2016-04-16: qty 1

## 2016-04-16 MED ORDER — BENZONATATE 100 MG PO CAPS: 100.0000 mg | ORAL_CAPSULE | Freq: Three times a day (TID) | ORAL | 0 refills | Status: DC | PRN

## 2016-04-16 NOTE — ED Notes (Signed)
Patient transported to X-ray 

## 2016-04-16 NOTE — ED Notes (Signed)
Reviewed DC instructions and patient going home with friend and denies any questions

## 2016-04-16 NOTE — ED Triage Notes (Signed)
Pt arrives ambulatory with c/o cough x 2 weeks. Pt states intermittently productive but clear phlegm.

## 2016-04-16 NOTE — ED Provider Notes (Signed)
[facility]-EMERGENCY DEPT Provider Note   CSN: [id number] Arrival date & time: 04/16/16  0808     History   Chief Complaint Chief Complaint  Patient presents with  . Cough    HPI Marriana Hibberd is a 25 y.o. female.  HPI   25 year old female with history of type 1 diabetes, allergic rhinitis presenting to the ED with complaints of cough. Patient states for the past week she has had persistent cough productive with clear sputum. Cough was worsening at nighttime sometimes keeping her up at night. She endorse pleuritic chest pain with the cough and occasional wheezing. When cough is intense, she also endorsed a bilateral temporal headache. She denies any specific treatment tried. She denies any fever, chills, runny nose, sneezing, sore throat, ear pain, abdominal pain, back pain, nausea vomiting diarrhea, hemoptysis, or rash. No history of PE or DVT and no significant risk factor. Denies any change in environment. Patient admits to tobacco abuse and states she smokes 2-3 cigars a day.  Past Medical History:  Diagnosis Date  . ALLERGIC RHINITIS 05/07/2010  . Diabetes mellitus without complication (HCC)   . DKA (diabetic ketoacidoses) (HCC)   . 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 PERS HX NONCOMPLIANCE W/MED TX PRS HAZARDS HLTH 08/24/2010    Patient Active Problem List   Diagnosis Date Noted  . Anemia 09/07/2013  . Intractable vomiting 06/07/2012  . Lung nodule seen on imaging study 06/07/2012  . Dental abscess 01/19/2012  . DKA (diabetic ketoacidoses) (HCC) 10/26/2011  . URI (upper respiratory infection) 10/26/2011  . ALLERGIC RHINITIS 05/07/2010  . 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 TONSILLECTOMY      OB History    Gravida Para Term Preterm AB Living   0 0 0 0 0 0   SAB TAB Ectopic Multiple Live Births   0 0 0 0 0       Home Medications    Prior to Admission medications   Medication Sig Start Date End Date Taking? Authorizing Provider    cetirizine (ZYRTEC) 10 MG tablet Take 10 mg by mouth daily.      Historical Provider, MD  Insulin Aspart Prot & Aspart (NOVOLOG MIX 70/30 FLEXPEN) (70-30) 100 UNIT/ML Pen Inject 40 Units into the skin 2 (two) times daily. 09/07/13   Stephani Police, PA-C  insulin NPH-regular Human (NOVOLIN 70/30) (70-30) 100 UNIT/ML injection Inject 40 Units into the skin 2 (two) times daily with a meal. 12/21/15   Stevi Barrett, PA-C    Family History Family History  Problem Relation Age of Onset  . Other Mother   . Other Father   . Diabetes Neg Hx     Social History Social History  Substance Use Topics  . Smoking status: Current Every Day Smoker    Types: Cigars  . Smokeless tobacco: Never Used  . Alcohol use No     Allergies   Penicillins; Banana; Orange concentrate [flavoring agent]; Peanut butter flavor; and Penicillins   Review of Systems Review of Systems  All other systems reviewed and are negative.    Physical Exam Updated Vital Signs BP 130/81 (BP Location: Right Arm)   Pulse 103   Temp 98.6 F (37 C)   Resp 20   Ht 5\' 7"  (1.702 m)   Wt 72.6 kg   LMP 03/31/2016 (Approximate)   SpO2 100%   BMI 25.06 kg/m   Physical Exam  Constitutional: She appears well-developed and well-nourished. No distress.  HENT:  Head: Atraumatic.  Right Ear: External ear normal.  Left Ear: External ear normal.  Nose: Nose normal.  Mouth/Throat: Oropharynx is clear and moist.  Eyes: Conjunctivae are normal.  Neck: Normal range of motion. Neck supple.  No nuchal rigidity  Cardiovascular: Normal rate, regular rhythm and intact distal pulses.   Pulmonary/Chest: Effort normal and breath sounds normal. No respiratory distress. She has no wheezes.  Abdominal: She exhibits no distension. There is no tenderness.  Musculoskeletal: She exhibits no edema.  Neurological: She is alert.  Skin: Capillary refill takes less than 2 seconds. No rash noted.  Psychiatric: She has a normal mood and affect.   Nursing note and vitals reviewed.    ED Treatments / Results  Labs (all labs ordered are listed, but only abnormal results are displayed) Labs Reviewed - No data to display  EKG  EKG Interpretation None       Radiology Dg Chest 2 View  Result Date: 04/16/2016 CLINICAL DATA:  Chest pain and nonproductive cough for several days. EXAM: CHEST  2 VIEW COMPARISON:  11/30/2015 FINDINGS: The heart size and mediastinal contours are within normal limits. Both lungs are clear. The visualized skeletal structures are unremarkable. IMPRESSION: No active cardiopulmonary disease. Electronically Signed   By: Myles RosenthalJohn  Stahl M.D.   On: 04/16/2016 09:10    Procedures Procedures (including critical care time)  Medications Ordered in ED Medications - No data to display   Initial Impression / Assessment and Plan / ED Course  I have reviewed the triage vital signs and the nursing notes.  Pertinent labs & imaging results that were available during my care of the patient were reviewed by me and considered in my medical decision making (see chart for details).  Clinical Course    BP 130/86   Pulse 92   Temp 98.6 F (37 C)   Resp 18   Ht 5\' 7"  (1.702 m)   Wt 72.6 kg   LMP 03/31/2016 (Approximate)   SpO2 100%   BMI 25.06 kg/m    Final Clinical Impressions(s) / ED Diagnoses   Final diagnoses:  Cough    New Prescriptions New Prescriptions   BENZONATATE (TESSALON) 100 MG CAPSULE    Take 1 capsule (100 mg total) by mouth 3 (three) times daily as needed for cough.   8:56 AM Patient presents with productive cough with clear sputum for the past 1-2 weeks. She is afebrile. Her lung exam is unremarkable. She does not have any significant risk factors for PE.  9:45 AM Chest x-ray is unremarkable, patient resting, oblique, eating chips, in no acute respiratory discomfort. Vital signs stable. Encouraged patient to follow-up with primary care provider for further evaluation of her condition.  Cough medication prescribed to use as needed. Return precaution discussed.   Fayrene HelperBowie Jerriah Ines, PA-C 04/16/16 0945    Mancel BaleElliott Wentz, MD 04/16/16 [phone]

## 2016-04-28 ENCOUNTER — Encounter (HOSPITAL_COMMUNITY): Payer: Self-pay | Admitting: Emergency Medicine

## 2016-04-28 DIAGNOSIS — E119 Type 2 diabetes mellitus without complications: Secondary | ICD-10-CM | POA: Insufficient documentation

## 2016-04-28 DIAGNOSIS — Z794 Long term (current) use of insulin: Secondary | ICD-10-CM | POA: Diagnosis not present

## 2016-04-28 DIAGNOSIS — R062 Wheezing: Secondary | ICD-10-CM | POA: Insufficient documentation

## 2016-04-28 DIAGNOSIS — F17210 Nicotine dependence, cigarettes, uncomplicated: Secondary | ICD-10-CM | POA: Insufficient documentation

## 2016-04-28 DIAGNOSIS — Z5321 Procedure and treatment not carried out due to patient leaving prior to being seen by health care provider: Secondary | ICD-10-CM | POA: Insufficient documentation

## 2016-04-28 DIAGNOSIS — R05 Cough: Secondary | ICD-10-CM | POA: Diagnosis not present

## 2016-04-28 DIAGNOSIS — R0989 Other specified symptoms and signs involving the circulatory and respiratory systems: Secondary | ICD-10-CM | POA: Diagnosis not present

## 2016-04-28 MED ORDER — ALBUTEROL SULFATE (2.5 MG/3ML) 0.083% IN NEBU
INHALATION_SOLUTION | RESPIRATORY_TRACT | Status: AC
  Filled 2016-04-28: qty 6

## 2016-04-28 MED ORDER — ALBUTEROL SULFATE (2.5 MG/3ML) 0.083% IN NEBU
5.0000 mg | INHALATION_SOLUTION | Freq: Once | RESPIRATORY_TRACT | Status: AC
  Administered 2016-04-28: 5 mg via RESPIRATORY_TRACT

## 2016-04-28 NOTE — ED Triage Notes (Signed)
Pt. reports wheezing and productive cough / chest congestion onset this afternoon , denies fever or chills.

## 2016-04-29 ENCOUNTER — Emergency Department (HOSPITAL_COMMUNITY)
Admission: EM | Admit: 2016-04-29 | Discharge: 2016-04-29 | Disposition: A | Discharge status: Home / Self Care | Payer: Medicaid Other | Attending: Dermatology | Admitting: Dermatology

## 2016-04-29 NOTE — ED Notes (Signed)
No answer when called for VS.  Patient not outside

## 2016-04-29 NOTE — ED Notes (Signed)
No response when called for reset vitals..Marland Kitchen
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
## 2017-09-19 ENCOUNTER — Encounter (HOSPITAL_BASED_OUTPATIENT_CLINIC_OR_DEPARTMENT_OTHER): Payer: Self-pay | Admitting: *Deleted

## 2017-09-19 ENCOUNTER — Other Ambulatory Visit: Payer: Self-pay

## 2017-09-19 DIAGNOSIS — Z760 Encounter for issue of repeat prescription: Secondary | ICD-10-CM | POA: Diagnosis not present

## 2017-09-19 DIAGNOSIS — R300 Dysuria: Secondary | ICD-10-CM | POA: Diagnosis present

## 2017-09-19 DIAGNOSIS — N3000 Acute cystitis without hematuria: Secondary | ICD-10-CM | POA: Diagnosis not present

## 2017-09-19 DIAGNOSIS — E109 Type 1 diabetes mellitus without complications: Secondary | ICD-10-CM | POA: Diagnosis not present

## 2017-09-19 DIAGNOSIS — Z794 Long term (current) use of insulin: Secondary | ICD-10-CM | POA: Diagnosis not present

## 2017-09-19 NOTE — ED Triage Notes (Signed)
pt c/o freq painful urination x 1 week

## 2017-09-20 ENCOUNTER — Emergency Department (HOSPITAL_BASED_OUTPATIENT_CLINIC_OR_DEPARTMENT_OTHER)
Admission: EM | Admit: 2017-09-20 | Discharge: 2017-09-20 | Disposition: A | Discharge status: Home / Self Care | Payer: Medicaid Other | Attending: Emergency Medicine | Admitting: Emergency Medicine

## 2017-09-20 DIAGNOSIS — N3000 Acute cystitis without hematuria: Secondary | ICD-10-CM

## 2017-09-20 LAB — URINALYSIS, MICROSCOPIC (REFLEX)

## 2017-09-20 LAB — URINALYSIS, ROUTINE W REFLEX MICROSCOPIC
BILIRUBIN URINE: NEGATIVE
Glucose, UA: >=500 mg/dL — AB
Ketones, ur: 15 mg/dL — AB
Leukocytes, UA: NEGATIVE
Nitrite: NEGATIVE
PH: 6 (ref 5.0–8.0)
Protein, ur: NEGATIVE mg/dL
SPECIFIC GRAVITY, URINE: 1.025 (ref 1.005–1.030)

## 2017-09-20 LAB — PREGNANCY, URINE: Preg Test, Ur: NEGATIVE

## 2017-09-20 MED ORDER — INSULIN NPH ISOPHANE & REGULAR (70-30) 100 UNIT/ML ~~LOC~~ SUSP: 40.0000 [IU] | Freq: Two times a day (BID) | SUBCUTANEOUS | 3 refills | Status: DC

## 2017-09-20 MED ORDER — CIPROFLOXACIN HCL 500 MG PO TABS: 500.0000 mg | ORAL_TABLET | Freq: Two times a day (BID) | ORAL | 0 refills | Status: DC

## 2017-09-20 MED ORDER — INSULIN ASPART PROT & ASPART (70-30 MIX) 100 UNIT/ML PEN: 40.0000 [IU] | PEN_INJECTOR | Freq: Two times a day (BID) | SUBCUTANEOUS | 3 refills | Status: DC

## 2017-09-20 NOTE — Discharge Instructions (Signed)
Cipro as prescribed.  Up with your primary doctor if not improving in the next 3-4 days, and return to the ER if symptoms significantly worsen or change.

## 2017-09-20 NOTE — ED Provider Notes (Addendum)
[facility] EMERGENCY DEPARTMENT Provider Note   CSN: [id number] Arrival date & time: 09/19/17  2146     History   Chief Complaint Chief Complaint  Patient presents with  . Dysuria    HPI Beverly Wells is a 27 y.o. female.  Patient is a 27 year old female with history of type 1 diabetes presenting for evaluation of dysuria.  She states that this has been occurring intermittently over the past week, however is worsened over the past 2 days.  She denies any fevers or chills.  She denies any nausea, vomiting, or diarrhea.  She states that when she finishes urinating, it burns and she feels as if she has to pee again.  Last menstrual period was last month and normal.  She denies the possibility of pregnancy.  She denies any vaginal discharge or new sexual contacts.   The history is provided by the patient.  Dysuria   This is a new problem. The current episode started 2 days ago. The problem occurs every urination. The problem has been gradually worsening. The pain is moderate. There has been no fever. Associated symptoms include frequency. Pertinent negatives include no chills, no discharge and no possible pregnancy. She has tried nothing for the symptoms.    Past Medical History:  Diagnosis Date  . ALLERGIC RHINITIS 05/07/2010  . Diabetes mellitus without complication (HCC)   . DKA (diabetic ketoacidoses) (HCC)   . 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 PERS HX NONCOMPLIANCE W/MED TX PRS HAZARDS HLTH 08/24/2010    Patient Active Problem List   Diagnosis Date Noted  . Anemia 09/07/2013  . Intractable vomiting 06/07/2012  . Lung nodule seen on imaging study 06/07/2012  . Dental abscess 01/19/2012  . DKA (diabetic ketoacidoses) (HCC) 10/26/2011  . URI (upper respiratory infection) 10/26/2011  . ALLERGIC RHINITIS 05/07/2010  . 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 TONSILLECTOMY      OB History    Gravida Para Term Preterm AB Living   0 0 0  0 0 0   SAB TAB Ectopic Multiple Live Births   0 0 0 0 0       Home Medications    Prior to Admission medications   Medication Sig Start Date End Date Taking? Authorizing Provider  benzonatate (TESSALON) 100 MG capsule Take 1 capsule (100 mg total) by mouth 3 (three) times daily as needed for cough. 04/16/16   Fayrene Helper, PA-C  cetirizine (ZYRTEC) 10 MG tablet Take 10 mg by mouth daily.      [provider]  Insulin Aspart Prot & Aspart (NOVOLOG MIX 70/30 FLEXPEN) (70-30) 100 UNIT/ML Pen Inject 40 Units into the skin 2 (two) times daily. 09/07/13   Dellinger, Tora Kindred, PA-C  insulin NPH-regular Human (NOVOLIN 70/30) (70-30) 100 UNIT/ML injection Inject 40 Units into the skin 2 (two) times daily with a meal. 12/21/15   Barrett, Stevi, PA-C  fluticasone (FLONASE) 50 MCG/ACT nasal spray Place 2 sprays into both nostrils daily. 12/22/13 12/22/13  Muthersbaugh, Dahlia Client, PA-C    Family History Family History  Problem Relation Age of Onset  . Other Mother   . Other Father   . Diabetes Neg Hx     Social History Social History   Tobacco Use  . Smoking status: Current Every Day Smoker    Types: Cigars  . Smokeless tobacco: Never Used  Substance Use Topics  . Alcohol use: No  . Drug use: No  Allergies   Penicillins; Banana; Orange concentrate [flavoring agent]; Peanut butter flavor; and Penicillins   Review of Systems Review of Systems  Constitutional: Negative for chills.  Genitourinary: Positive for dysuria and frequency.  All other systems reviewed and are negative.    Physical Exam Updated Vital Signs BP 129/79   Pulse 89   Temp 98.9 F (37.2 C) (Oral)   Resp 18   Ht 5\' 7"  (1.702 m)   Wt 70.3 kg (155 lb)   LMP 09/14/2017   SpO2 100%   BMI 24.28 kg/m   Physical Exam  Constitutional: She is oriented to person, place, and time. She appears well-developed and well-nourished. No distress.  HENT:  Head: Normocephalic and atraumatic.  Neck: Normal range of  motion. Neck supple.  Cardiovascular: Normal rate and regular rhythm. Exam reveals no gallop and no friction rub.  No murmur heard. Pulmonary/Chest: Effort normal and breath sounds normal. No respiratory distress. She has no wheezes.  Abdominal: Soft. Bowel sounds are normal. She exhibits no distension. There is no tenderness.  Musculoskeletal: Normal range of motion.  Neurological: She is alert and oriented to person, place, and time.  Skin: Skin is warm and dry. She is not diaphoretic.  Nursing note and vitals reviewed.    ED Treatments / Results  Labs (all labs ordered are listed, but only abnormal results are displayed) Labs Reviewed  URINALYSIS, ROUTINE W REFLEX MICROSCOPIC  PREGNANCY, URINE    EKG  EKG Interpretation None       Radiology No results found.  Procedures Procedures (including critical care time)  Medications Ordered in ED Medications - No data to display   Initial Impression / Assessment and Plan / ED Course  I have reviewed the triage vital signs and the nursing notes.  Pertinent labs & imaging results that were available during my care of the patient were reviewed by me and considered in my medical decision making (see chart for details).  Patient with history of UTIs presenting with urinary frequency and dysuria.  Her urinalysis is not impressive, however there are many bacteria.  She is convinced she has a UTI and will be treated with Cipro.  She is to follow-up as needed for any problems.  Her insulin will be refilled at her request as well.  Final Clinical Impressions(s) / ED Diagnoses   Final diagnoses:  None    ED Discharge Orders    None       Geoffery Lyonselo, Koa Palla, MD 09/20/17 [id number]    Geoffery Lyonselo, Milik Gilreath, MD 09/20/17 [phone]

## 2017-09-20 NOTE — ED Notes (Signed)
Pt discharged to home with family. NAD.  

## 2017-10-03 ENCOUNTER — Encounter (HOSPITAL_BASED_OUTPATIENT_CLINIC_OR_DEPARTMENT_OTHER): Payer: Self-pay

## 2017-10-03 ENCOUNTER — Other Ambulatory Visit: Payer: Self-pay

## 2017-10-03 DIAGNOSIS — Z202 Contact with and (suspected) exposure to infections with a predominantly sexual mode of transmission: Secondary | ICD-10-CM | POA: Diagnosis not present

## 2017-10-03 DIAGNOSIS — Z79899 Other long term (current) drug therapy: Secondary | ICD-10-CM | POA: Insufficient documentation

## 2017-10-03 DIAGNOSIS — R05 Cough: Secondary | ICD-10-CM | POA: Diagnosis present

## 2017-10-03 DIAGNOSIS — F17290 Nicotine dependence, other tobacco product, uncomplicated: Secondary | ICD-10-CM | POA: Diagnosis not present

## 2017-10-03 DIAGNOSIS — Z794 Long term (current) use of insulin: Secondary | ICD-10-CM | POA: Insufficient documentation

## 2017-10-03 DIAGNOSIS — B349 Viral infection, unspecified: Secondary | ICD-10-CM | POA: Insufficient documentation

## 2017-10-03 DIAGNOSIS — E119 Type 2 diabetes mellitus without complications: Secondary | ICD-10-CM | POA: Diagnosis not present

## 2017-10-03 DIAGNOSIS — J069 Acute upper respiratory infection, unspecified: Secondary | ICD-10-CM | POA: Insufficient documentation

## 2017-10-03 NOTE — ED Triage Notes (Signed)
Pt c/o cough x 2 days-also requesting to have STD screen-NAD-steady gait

## 2017-10-04 ENCOUNTER — Emergency Department (HOSPITAL_BASED_OUTPATIENT_CLINIC_OR_DEPARTMENT_OTHER)
Admission: EM | Admit: 2017-10-04 | Discharge: 2017-10-04 | Discharge status: Against Medical Advice | Payer: Medicaid Other | Attending: Emergency Medicine | Admitting: Emergency Medicine

## 2017-10-04 ENCOUNTER — Emergency Department (HOSPITAL_BASED_OUTPATIENT_CLINIC_OR_DEPARTMENT_OTHER): Payer: Medicaid Other

## 2017-10-04 DIAGNOSIS — B9789 Other viral agents as the cause of diseases classified elsewhere: Secondary | ICD-10-CM

## 2017-10-04 DIAGNOSIS — J069 Acute upper respiratory infection, unspecified: Secondary | ICD-10-CM

## 2017-10-04 MED ORDER — BENZONATATE 100 MG PO CAPS
100.0000 mg | ORAL_CAPSULE | Freq: Once | ORAL | Status: AC
  Administered 2017-10-04: 100 mg via ORAL
  Filled 2017-10-04: qty 1

## 2017-10-04 MED ORDER — ALBUTEROL SULFATE HFA 108 (90 BASE) MCG/ACT IN AERS
2.0000 | INHALATION_SPRAY | Freq: Once | RESPIRATORY_TRACT | Status: AC
  Administered 2017-10-04: 2 via RESPIRATORY_TRACT
  Filled 2017-10-04: qty 6.7

## 2017-10-04 NOTE — ED Provider Notes (Signed)
[facility] EMERGENCY DEPARTMENT Provider Note   CSN: [id number] Arrival date & time: 10/03/17  1944     History   Chief Complaint Chief Complaint  Patient presents with  . Cough  . STD screening request    HPI Beverly Wells is a 27 y.o. female.  HPI  This is a 27 year old female with a history of diabetes who presents with cough and upper respiratory symptoms.  Patient reports onset of cough that is nonproductive on Thursday.  No known sick contacts.  She reports upper respiratory congestion and chest pain worse with coughing.  Reports blood sugars have been "okay."  Denies nausea, vomiting, diarrhea, abdominal pain.  Patient is requesting evaluation for HIV, hepatitis C.  She has no STD symptoms.  She just states that she is sexually active and wants further testing.  Past Medical History:  Diagnosis Date  . ALLERGIC RHINITIS 05/07/2010  . Diabetes mellitus without complication (HCC)   . DKA (diabetic ketoacidoses) (HCC)   . 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 PERS HX NONCOMPLIANCE W/MED TX PRS HAZARDS HLTH 08/24/2010    Patient Active Problem List   Diagnosis Date Noted  . Anemia 09/07/2013  . Intractable vomiting 06/07/2012  . Lung nodule seen on imaging study 06/07/2012  . Dental abscess 01/19/2012  . DKA (diabetic ketoacidoses) (HCC) 10/26/2011  . URI (upper respiratory infection) 10/26/2011  . ALLERGIC RHINITIS 05/07/2010  . 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 TONSILLECTOMY      OB History    Gravida Para Term Preterm AB Living   0 0 0 0 0 0   SAB TAB Ectopic Multiple Live Births   0 0 0 0 0       Home Medications    Prior to Admission medications   Medication Sig Start Date End Date Taking? Authorizing Provider  cetirizine (ZYRTEC) 10 MG tablet Take 10 mg by mouth daily.      [provider]  insulin aspart protamine - aspart (NOVOLOG MIX 70/30 FLEXPEN) (70-30) 100 UNIT/ML FlexPen Inject 0.4 mLs (40  Units total) into the skin 2 (two) times daily. 09/20/17   Geoffery Lyonselo, Douglas, MD  insulin NPH-regular Human (NOVOLIN 70/30) (70-30) 100 UNIT/ML injection Inject 40 Units into the skin 2 (two) times daily with a meal. 09/20/17   Geoffery Lyonselo, Douglas, MD    Family History Family History  Problem Relation Age of Onset  . Other Mother   . Other Father   . Diabetes Neg Hx     Social History Social History   Tobacco Use  . Smoking status: Current Every Day Smoker    Types: Cigars  . Smokeless tobacco: Never Used  Substance Use Topics  . Alcohol use: No  . Drug use: No     Allergies   Penicillins; Banana; Orange concentrate [flavoring agent]; Peanut butter flavor; and Penicillins   Review of Systems Review of Systems  Constitutional: Negative for chills and fever.  HENT: Positive for congestion.   Respiratory: Positive for cough and shortness of breath.   Cardiovascular: Positive for chest pain.  Gastrointestinal: Negative for abdominal pain, diarrhea, nausea and vomiting.  Genitourinary: Negative for dysuria.  Skin: Negative for rash.  All other systems reviewed and are negative.    Physical Exam Updated Vital Signs BP 122/76 (BP Location: Right Arm)   Pulse 87   Temp 99.1 F (37.3 C) (Oral)   Resp 18   Ht 5\' 7"  (1.702 m)  Wt 73.9 kg (163 lb)   LMP 09/19/2017   SpO2 98%   BMI 25.53 kg/m   Physical Exam  Constitutional: She is oriented to person, place, and time. She appears well-developed and well-nourished.  Occasional cough noted  HENT:  Head: Normocephalic and atraumatic.  Mouth/Throat: Oropharynx is clear and moist.  Eyes: Pupils are equal, round, and reactive to light.  Cardiovascular: Normal rate, regular rhythm and normal heart sounds.  Pulmonary/Chest: Effort normal. No respiratory distress. She has wheezes. She exhibits tenderness.  Occasional expiratory wheeze  Abdominal: Soft. Bowel sounds are normal. There is no tenderness.  Neurological: She is alert and  oriented to person, place, and time.  Skin: Skin is warm and dry.  Psychiatric: She has a normal mood and affect.  Nursing note and vitals reviewed.    ED Treatments / Results  Labs (all labs ordered are listed, but only abnormal results are displayed) Labs Reviewed - No data to display  EKG  EKG Interpretation None       Radiology Dg Chest 2 View  Result Date: 10/04/2017 CLINICAL DATA:  Cough and chest pain for 2 days EXAM: CHEST  2 VIEW COMPARISON:  chest radiograph 04/16/2016 FINDINGS: The heart size and mediastinal contours are within normal limits. Both lungs are clear. The visualized skeletal structures are unremarkable. IMPRESSION: No active cardiopulmonary disease. Electronically Signed   By: Deatra Robinson M.D.   On: 10/04/2017 01:46    Procedures Procedures (including critical care time)  Medications Ordered in ED Medications  albuterol (PROVENTIL HFA;VENTOLIN HFA) 108 (90 Base) MCG/ACT inhaler 2 puff (2 puffs Inhalation Given 10/04/17 0113)  benzonatate (TESSALON) capsule 100 mg (100 mg Oral Given 10/04/17 0112)     Initial Impression / Assessment and Plan / ED Course  I have reviewed the triage vital signs and the nursing notes.  Pertinent labs & imaging results that were available during my care of the patient were reviewed by me and considered in my medical decision making (see chart for details).     Patient presents with upper respiratory symptoms ongoing since Thursday.  She is overall nontoxic appearing and vital signs are reassuring.  She is afebrile.  She does have some wheeze on exam.  She is a current smoker.  Patient given albuterol and Tessalon Perles.  Chest x-ray shows no evidence of pneumonia.  Regarding her request for STD testing, feel this would be better done as an outpatient as she is currently asymptomatic.  She does have a primary physician.  Patient is okay with deferring testing.  Prior to reevaluation, patient left without  results.     Final Clinical Impressions(s) / ED Diagnoses   Final diagnoses:  Viral URI with cough    ED Discharge Orders    None       Shon Baton, MD 10/04/17 [phone]

## 2017-10-04 NOTE — ED Notes (Signed)
Dry, hacking cough. Instructed to drink fluids, has a water at bedside

## 2020-03-24 ENCOUNTER — Emergency Department (HOSPITAL_BASED_OUTPATIENT_CLINIC_OR_DEPARTMENT_OTHER): Payer: Self-pay

## 2020-03-24 ENCOUNTER — Emergency Department (HOSPITAL_BASED_OUTPATIENT_CLINIC_OR_DEPARTMENT_OTHER)
Admission: EM | Admit: 2020-03-24 | Discharge: 2020-03-24 | Disposition: A | Discharge status: Home / Self Care | Payer: Self-pay | Attending: Emergency Medicine | Admitting: Emergency Medicine

## 2020-03-24 ENCOUNTER — Encounter (HOSPITAL_BASED_OUTPATIENT_CLINIC_OR_DEPARTMENT_OTHER): Payer: Self-pay | Admitting: *Deleted

## 2020-03-24 ENCOUNTER — Other Ambulatory Visit: Payer: Self-pay

## 2020-03-24 DIAGNOSIS — Y999 Unspecified external cause status: Secondary | ICD-10-CM | POA: Insufficient documentation

## 2020-03-24 DIAGNOSIS — S0502XA Injury of conjunctiva and corneal abrasion without foreign body, left eye, initial encounter: Secondary | ICD-10-CM | POA: Insufficient documentation

## 2020-03-24 DIAGNOSIS — Y939 Activity, unspecified: Secondary | ICD-10-CM | POA: Insufficient documentation

## 2020-03-24 DIAGNOSIS — S6991XA Unspecified injury of right wrist, hand and finger(s), initial encounter: Secondary | ICD-10-CM

## 2020-03-24 DIAGNOSIS — E1065 Type 1 diabetes mellitus with hyperglycemia: Secondary | ICD-10-CM | POA: Insufficient documentation

## 2020-03-24 DIAGNOSIS — M7918 Myalgia, other site: Secondary | ICD-10-CM | POA: Insufficient documentation

## 2020-03-24 DIAGNOSIS — Y929 Unspecified place or not applicable: Secondary | ICD-10-CM | POA: Insufficient documentation

## 2020-03-24 DIAGNOSIS — S60931A Unspecified superficial injury of right thumb, initial encounter: Secondary | ICD-10-CM | POA: Insufficient documentation

## 2020-03-24 DIAGNOSIS — F17290 Nicotine dependence, other tobacco product, uncomplicated: Secondary | ICD-10-CM | POA: Insufficient documentation

## 2020-03-24 LAB — URINALYSIS, MICROSCOPIC (REFLEX)

## 2020-03-24 LAB — PREGNANCY, URINE: Preg Test, Ur: NEGATIVE

## 2020-03-24 LAB — BASIC METABOLIC PANEL
Anion gap: 10 (ref 5–15)
BUN: 13 mg/dL (ref 6–20)
CO2: 24 mmol/L (ref 22–32)
Calcium: 8.6 mg/dL — ABNORMAL LOW (ref 8.9–10.3)
Chloride: 96 mmol/L — ABNORMAL LOW (ref 98–111)
Creatinine, Ser: 0.83 mg/dL (ref 0.44–1.00)
GFR calc Af Amer: >60 mL/min (ref >60)
GFR calc non Af Amer: >60 mL/min (ref >60)
Glucose, Bld: 592 mg/dL (ref 70–99)
Potassium: 4.9 mmol/L (ref 3.5–5.1)
Sodium: 130 mmol/L — ABNORMAL LOW (ref 135–145)

## 2020-03-24 LAB — CBC
HCT: 37.2 % (ref 36.0–46.0)
Hemoglobin: 11.6 g/dL — ABNORMAL LOW (ref 12.0–15.0)
MCH: 23.8 pg — ABNORMAL LOW (ref 26.0–34.0)
MCHC: 31.2 g/dL (ref 30.0–36.0)
MCV: 76.2 fL — ABNORMAL LOW (ref 80.0–100.0)
Platelets: 267 10*3/uL (ref 150–400)
RBC: 4.88 MIL/uL (ref 3.87–5.11)
RDW: 14.2 % (ref 11.5–15.5)
WBC: 5.6 10*3/uL (ref 4.0–10.5)
nRBC: 0 % (ref 0.0–0.2)

## 2020-03-24 LAB — CBG MONITORING, ED
Glucose-Capillary: 497 mg/dL — ABNORMAL HIGH (ref 70–99)
Glucose-Capillary: 560 mg/dL (ref 70–99)
Glucose-Capillary: 430 mg/dL — ABNORMAL HIGH (ref 70–99)
Glucose-Capillary: 396 mg/dL — ABNORMAL HIGH (ref 70–99)

## 2020-03-24 LAB — URINALYSIS, ROUTINE W REFLEX MICROSCOPIC
Bilirubin Urine: NEGATIVE
Glucose, UA: >=500 mg/dL — AB
Ketones, ur: NEGATIVE mg/dL
Leukocytes,Ua: NEGATIVE
Nitrite: NEGATIVE
Protein, ur: NEGATIVE mg/dL
Specific Gravity, Urine: 1.02 (ref 1.005–1.030)
pH: 6 (ref 5.0–8.0)

## 2020-03-24 MED ORDER — SODIUM CHLORIDE 0.9 % IV BOLUS
1000.0000 mL | Freq: Once | INTRAVENOUS | Status: AC
  Administered 2020-03-24: 1000 mL via INTRAVENOUS

## 2020-03-24 MED ORDER — TETRACAINE HCL 0.5 % OP SOLN
2.0000 [drp] | Freq: Once | OPHTHALMIC | Status: AC
  Administered 2020-03-24: 2 [drp] via OPHTHALMIC
  Filled 2020-03-24: qty 4

## 2020-03-24 MED ORDER — ERYTHROMYCIN 5 MG/GM OP OINT
TOPICAL_OINTMENT | Freq: Once | OPHTHALMIC | Status: AC
  Filled 2020-03-24: qty 3.5

## 2020-03-24 MED ORDER — FLUORESCEIN SODIUM 1 MG OP STRP
1.0000 | ORAL_STRIP | Freq: Once | OPHTHALMIC | Status: AC
  Administered 2020-03-24: 1 via OPHTHALMIC

## 2020-03-24 MED ORDER — INSULIN NPH (HUMAN) (ISOPHANE) 100 UNIT/ML ~~LOC~~ SUSP
50.0000 [IU] | Freq: Once | SUBCUTANEOUS | Status: AC
  Administered 2020-03-24: 50 [IU] via SUBCUTANEOUS
  Filled 2020-03-24: qty 10

## 2020-03-24 MED ORDER — IBUPROFEN 400 MG PO TABS
600.0000 mg | ORAL_TABLET | Freq: Once | ORAL | Status: AC
  Administered 2020-03-24: 600 mg via ORAL
  Filled 2020-03-24: qty 1

## 2020-03-24 NOTE — Discharge Instructions (Signed)
Diabetes: Continue taking your insulin as directed.  Stay hydrated with water.  Hand Care: Apply ice to your right hand 4 times at a time to help with pain and swelling.  You can take ibuprofen as needed for pain.  If symptoms persist, follow-up with the sports medicine provider upstairs, Dr. Kariah Loredo Likes.  Eye Care: Please read instructions below. Apply a warm compress to your right eye multiple times per day. Apply 1/2 inch ribbon of antibiotic ointment to your eye 4 times daily for 5 days. Schedule an appointment with the eye doctor, Dr. Dione Booze, to follow-up and ensure proper healing. Return to the emergency department if you develop thick yellow/greenish discharge, loss of vision, or new or worsening symptoms.
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
## 2020-03-24 NOTE — ED Triage Notes (Signed)
States she missed her Insulin yesterday and her BS is 412. Also here for pian in her right hand, and left eye pain.
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
## 2020-04-02 ENCOUNTER — Ambulatory Visit: Payer: Self-pay | Admitting: Family Medicine

## 2020-04-02 NOTE — Progress Notes (Deleted)
  Beverly Wells - 29 y.o. female MRN [id number]  Date of birth: 10-22-1990  SUBJECTIVE:  Including CC & ROS.  No chief complaint on file.   Beverly Wells is a 29 y.o. female that is  ***.  ***   Review of Systems See HPI   HISTORY: Past Medical, Surgical, Social, and Family History Reviewed & Updated per EMR.   Pertinent Historical Findings include:  Past Medical History:  Diagnosis Date  . ALLERGIC RHINITIS 05/07/2010  . Diabetes mellitus without complication (HCC)   . DKA (diabetic ketoacidoses) (HCC)   . 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 PERS HX NONCOMPLIANCE W/MED TX PRS HAZARDS HLTH 08/24/2010    Past Surgical History:  Procedure Laterality Date  . TONSILLECTOMY      Family History  Problem Relation Age of Onset  . Other Mother   . Other Father   . Diabetes Neg Hx     Social History   Socioeconomic History  . Marital status: Single    Spouse name: Not on file  . Number of children: Not on file  . Years of education: Not on file  . Highest education level: Not on file  Occupational History  . Not on file  Tobacco Use  . Smoking status: Current Every Day Smoker    Types: Cigars  . Smokeless tobacco: Never Used  Substance and Sexual Activity  . Alcohol use: No  . Drug use: No  . Sexual activity: Not on file  Other Topics Concern  . Not on file  Social History Narrative   ** Merged History Encounter **       Lives with aunt (Myrna Turner)   High school student.   Social Determinants of Health   Financial Resource Strain:   . Difficulty of Paying Living Expenses:   Food Insecurity:   . Worried About Programme researcher, broadcasting/film/video in the Last Year:   . Barista in the Last Year:   Transportation Needs:   . Freight forwarder (Medical):   Marland Kitchen Lack of Transportation (Non-Medical):   Physical Activity:   . Days of Exercise per Week:   . Minutes of Exercise per Session:   Stress:   . Feeling of Stress :   Social Connections:   . Frequency of  Communication with Friends and Family:   . Frequency of Social Gatherings with Friends and Family:   . Attends Religious Services:   . Active Member of Clubs or Organizations:   . Attends Banker Meetings:   Marland Kitchen Marital Status:   Intimate Partner Violence:   . Fear of Current or Ex-Partner:   . Emotionally Abused:   Marland Kitchen Physically Abused:   . Sexually Abused:      PHYSICAL EXAM:  VS: LMP 03/04/2020  Physical Exam Gen: NAD, alert, cooperative with exam, well-appearing MSK:  ***      ASSESSMENT & PLAN:   No problem-specific Assessment & Plan notes found for this encounter.

## 2020-04-18 ENCOUNTER — Emergency Department (HOSPITAL_COMMUNITY): Admission: EM | Admit: 2020-04-18 | Discharge: 2020-04-18 | Discharge status: Against Medical Advice | Payer: Self-pay

## 2020-04-18 NOTE — ED Notes (Signed)
Pt states she is leaving due to wait time  

## 2020-05-04 ENCOUNTER — Encounter (HOSPITAL_BASED_OUTPATIENT_CLINIC_OR_DEPARTMENT_OTHER): Payer: Self-pay | Admitting: *Deleted

## 2020-05-04 ENCOUNTER — Other Ambulatory Visit: Payer: Self-pay

## 2020-05-04 ENCOUNTER — Emergency Department (HOSPITAL_BASED_OUTPATIENT_CLINIC_OR_DEPARTMENT_OTHER)
Admission: EM | Admit: 2020-05-04 | Discharge: 2020-05-04 | Disposition: A | Discharge status: Home / Self Care | Payer: Self-pay | Attending: Emergency Medicine | Admitting: Emergency Medicine

## 2020-05-04 DIAGNOSIS — Z91010 Allergy to peanuts: Secondary | ICD-10-CM | POA: Insufficient documentation

## 2020-05-04 DIAGNOSIS — F10929 Alcohol use, unspecified with intoxication, unspecified: Secondary | ICD-10-CM | POA: Insufficient documentation

## 2020-05-04 DIAGNOSIS — Z794 Long term (current) use of insulin: Secondary | ICD-10-CM | POA: Insufficient documentation

## 2020-05-04 DIAGNOSIS — N390 Urinary tract infection, site not specified: Secondary | ICD-10-CM | POA: Insufficient documentation

## 2020-05-04 DIAGNOSIS — F10920 Alcohol use, unspecified with intoxication, uncomplicated: Secondary | ICD-10-CM

## 2020-05-04 DIAGNOSIS — E1110 Type 2 diabetes mellitus with ketoacidosis without coma: Secondary | ICD-10-CM | POA: Insufficient documentation

## 2020-05-04 DIAGNOSIS — F17290 Nicotine dependence, other tobacco product, uncomplicated: Secondary | ICD-10-CM | POA: Insufficient documentation

## 2020-05-04 LAB — BASIC METABOLIC PANEL
Anion gap: 11 (ref 5–15)
BUN: 15 mg/dL (ref 6–20)
CO2: 25 mmol/L (ref 22–32)
Calcium: 8.8 mg/dL — ABNORMAL LOW (ref 8.9–10.3)
Chloride: 103 mmol/L (ref 98–111)
Creatinine, Ser: 0.7 mg/dL (ref 0.44–1.00)
GFR calc Af Amer: >60 mL/min (ref >60)
GFR calc non Af Amer: >60 mL/min (ref >60)
Glucose, Bld: 131 mg/dL — ABNORMAL HIGH (ref 70–99)
Potassium: 3.5 mmol/L (ref 3.5–5.1)
Sodium: 139 mmol/L (ref 135–145)

## 2020-05-04 LAB — URINALYSIS, ROUTINE W REFLEX MICROSCOPIC
Bilirubin Urine: NEGATIVE
Glucose, UA: >=500 mg/dL — AB
Ketones, ur: NEGATIVE mg/dL
Leukocytes,Ua: NEGATIVE
Nitrite: NEGATIVE
Protein, ur: 30 mg/dL — AB
Specific Gravity, Urine: >1.03 — ABNORMAL HIGH (ref 1.005–1.030)
pH: 6 (ref 5.0–8.0)

## 2020-05-04 LAB — URINALYSIS, MICROSCOPIC (REFLEX)

## 2020-05-04 LAB — CBC WITH DIFFERENTIAL/PLATELET
Abs Immature Granulocytes: 0.01 10*3/uL (ref 0.00–0.07)
Basophils Absolute: 0 10*3/uL (ref 0.0–0.1)
Basophils Relative: 0 %
Eosinophils Absolute: 0.2 10*3/uL (ref 0.0–0.5)
Eosinophils Relative: 3 %
HCT: 34 % — ABNORMAL LOW (ref 36.0–46.0)
Hemoglobin: 10.1 g/dL — ABNORMAL LOW (ref 12.0–15.0)
Immature Granulocytes: 0 %
Lymphocytes Relative: 39 %
Lymphs Abs: 2.2 10*3/uL (ref 0.7–4.0)
MCH: 23.2 pg — ABNORMAL LOW (ref 26.0–34.0)
MCHC: 29.7 g/dL — ABNORMAL LOW (ref 30.0–36.0)
MCV: 78 fL — ABNORMAL LOW (ref 80.0–100.0)
Monocytes Absolute: 0.4 10*3/uL (ref 0.1–1.0)
Monocytes Relative: 7 %
Neutro Abs: 2.8 10*3/uL (ref 1.7–7.7)
Neutrophils Relative %: 51 %
Platelets: 243 10*3/uL (ref 150–400)
RBC: 4.36 MIL/uL (ref 3.87–5.11)
RDW: 14.5 % (ref 11.5–15.5)
WBC: 5.6 10*3/uL (ref 4.0–10.5)
nRBC: 0 % (ref 0.0–0.2)

## 2020-05-04 LAB — CBG MONITORING, ED: Glucose-Capillary: 123 mg/dL — ABNORMAL HIGH (ref 70–99)

## 2020-05-04 MED ORDER — SULFAMETHOXAZOLE-TRIMETHOPRIM 800-160 MG PO TABS
1.0000 | ORAL_TABLET | Freq: Two times a day (BID) | ORAL | 0 refills | Status: AC
Start: 2020-05-04 — End: 2020-05-11

## 2020-05-04 MED ORDER — SODIUM CHLORIDE 0.9 % IV BOLUS
500.0000 mL | Freq: Once | INTRAVENOUS | Status: AC
  Administered 2020-05-04: 500 mL via INTRAVENOUS

## 2020-05-04 MED ORDER — SULFAMETHOXAZOLE-TRIMETHOPRIM 800-160 MG PO TABS
1.0000 | ORAL_TABLET | Freq: Once | ORAL | Status: AC
  Administered 2020-05-04: 1 via ORAL
  Filled 2020-05-04: qty 1

## 2020-05-04 NOTE — ED Triage Notes (Signed)
Pt. States she is a type 1 diabetic that has been drinking Personal assistant. Unsure of amount. C/o left lower back back pain. C/o urinary frequency from her blood sugar being high. States she took 40 units of NPH at 2300.

## 2020-05-04 NOTE — ED Notes (Signed)
IV attempted in left [facility] without success - lots of scar tissue

## 2020-05-04 NOTE — ED Provider Notes (Signed)
[facility] EMERGENCY DEPARTMENT Provider Note   CSN: [id number] Arrival date & time: 05/04/20  0206     History Chief Complaint  Patient presents with  . etoh/DM    Beverly Wells is a 29 y.o. female.  The history is provided by the patient.  Back Pain Location:  Sacro-iliac joint Quality:  Aching Radiates to:  Does not radiate Pain severity:  Moderate Pain is:  Same all the time Onset quality:  Gradual Timing:  Constant Progression:  Unchanged Chronicity:  New Context: not MCA and not MVA   Context comment:  Drinking a lot of tequila and is a type 1 diabetic . Also urinating frequently  Relieved by:  Nothing Worsened by:  Nothing Ineffective treatments:  None tried Associated symptoms: no abdominal pain, no abdominal swelling, no bladder incontinence, no bowel incontinence, no chest pain, no dysuria, no fever, no headaches, no leg pain, no numbness, no paresthesias, no pelvic pain, no perianal numbness, no tingling, no weakness and no weight loss   Risk factors: no hx of cancer        Past Medical History:  Diagnosis Date  . ALLERGIC RHINITIS 05/07/2010  . Diabetes mellitus without complication (HCC)   . DKA (diabetic ketoacidoses) (HCC)   . 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 PERS HX NONCOMPLIANCE W/MED TX PRS HAZARDS HLTH 08/24/2010    Patient Active Problem List   Diagnosis Date Noted  . Anemia 09/07/2013  . Intractable vomiting 06/07/2012  . Lung nodule seen on imaging study 06/07/2012  . Dental abscess 01/19/2012  . DKA (diabetic ketoacidoses) (HCC) 10/26/2011  . URI (upper respiratory infection) 10/26/2011  . ALLERGIC RHINITIS 05/07/2010  . 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 TONSILLECTOMY       OB History    Gravida  0   Para  0   Term  0   Preterm  0   AB  0   Living  0     SAB  0   TAB  0   Ectopic  0   Multiple  0   Live Births  0           Family History  Problem Relation Age  of Onset  . Other Mother   . Other Father   . Diabetes Neg Hx     Social History   Tobacco Use  . Smoking status: Current Every Day Smoker    Types: E-cigarettes  . Smokeless tobacco: Never Used  Substance Use Topics  . Alcohol use: Yes    Comment: occasional   . Drug use: No    Home Medications Prior to Admission medications   Medication Sig Start Date End Date Taking? Authorizing Provider  cetirizine (ZYRTEC) 10 MG tablet Take 10 mg by mouth in the morning and at bedtime.     [provider]  Ferrous Sulfate (IRON PO) Take 1 tablet by mouth daily.    [provider]  insulin NPH Human (NOVOLIN N) 100 UNIT/ML injection Inject into the skin in the morning and at bedtime. 40mg  AM 20 lunch  40mg  HS    [provider]  sulfamethoxazole-trimethoprim (BACTRIM DS) 800-160 MG tablet Take 1 tablet by mouth 2 (two) times daily for 7 days. 05/04/20 05/11/20  Waverly Chavarria, MD    Allergies    Insulins, Penicillins, Banana, Orange concentrate [flavoring agent], Peanut butter flavor, and Penicillins  Review of Systems   Review of Systems  Constitutional:  Negative for fever and weight loss.  HENT: Negative for congestion.   Eyes: Negative for visual disturbance.  Respiratory: Negative for shortness of breath.   Cardiovascular: Negative for chest pain.  Gastrointestinal: Negative for abdominal pain and bowel incontinence.  Genitourinary: Positive for frequency. Negative for bladder incontinence, dysuria and pelvic pain.  Musculoskeletal: Positive for back pain.  Neurological: Negative for tingling, weakness, numbness, headaches and paresthesias.  Psychiatric/Behavioral: Negative for agitation.  All other systems reviewed and are negative.   Physical Exam Updated Vital Signs BP 123/81 (BP Location: Right Arm)   Pulse 88   Temp 98.2 F (36.8 C)   Resp 16   Ht 5\' 7"  (1.702 m)   Wt 72.6 kg   LMP 05/01/2020 (Approximate)   SpO2 100%   BMI 25.06 kg/m    Physical Exam Vitals and nursing note reviewed.  Constitutional:      General: She is not in acute distress.    Appearance: Normal appearance.  HENT:     Head: Normocephalic and atraumatic.     Nose: Nose normal.  Eyes:     Conjunctiva/sclera: Conjunctivae normal.     Pupils: Pupils are equal, round, and reactive to light.  Cardiovascular:     Rate and Rhythm: Normal rate and regular rhythm.     Pulses: Normal pulses.     Heart sounds: Normal heart sounds.  Pulmonary:     Effort: Pulmonary effort is normal.     Breath sounds: Normal breath sounds.  Abdominal:     General: Abdomen is flat. Bowel sounds are normal.     Palpations: Abdomen is soft.     Tenderness: There is no abdominal tenderness. There is no guarding or rebound.  Musculoskeletal:        General: Normal range of motion.     Cervical back: Normal range of motion and neck supple.  Skin:    General: Skin is warm and dry.     Capillary Refill: Capillary refill takes less than 2 seconds.  Neurological:     General: No focal deficit present.     Mental Status: She is alert and oriented to person, place, and time.     Deep Tendon Reflexes: Reflexes normal.  Psychiatric:        Mood and Affect: Mood normal.        Behavior: Behavior normal.     ED Results / Procedures / Treatments   Labs (all labs ordered are listed, but only abnormal results are displayed) Results for orders placed or performed during the hospital encounter of 05/04/20  CBC with Differential/Platelet  Result Value Ref Range   WBC 5.6 4.0 - 10.5 K/uL   RBC 4.36 3.87 - 5.11 MIL/uL   Hemoglobin 10.1 (L) 12.0 - 15.0 g/dL   HCT 16.134.0 (L) 36 - 46 %   MCV 78.0 (L) 80.0 - 100.0 fL   MCH 23.2 (L) 26.0 - 34.0 pg   MCHC 29.7 (L) 30.0 - 36.0 g/dL   RDW 09.614.5 04.511.5 - 40.915.5 %   Platelets 243 150 - 400 K/uL   nRBC 0.0 0.0 - 0.2 %   Neutrophils Relative % 51 %   Neutro Abs 2.8 1.7 - 7.7 K/uL   Lymphocytes Relative 39 %   Lymphs Abs 2.2 0.7 - 4.0 K/uL    Monocytes Relative 7 %   Monocytes Absolute 0.4 0 - 1 K/uL   Eosinophils Relative 3 %   Eosinophils Absolute 0.2 0 - 0 K/uL   Basophils  Relative 0 %   Basophils Absolute 0.0 0 - 0 K/uL   Immature Granulocytes 0 %   Abs Immature Granulocytes 0.01 0.00 - 0.07 K/uL  Basic metabolic panel  Result Value Ref Range   Sodium 139 135 - 145 mmol/L   Potassium 3.5 3.5 - 5.1 mmol/L   Chloride 103 98 - 111 mmol/L   CO2 25 22 - 32 mmol/L   Glucose, Bld 131 (H) 70 - 99 mg/dL   BUN 15 6 - 20 mg/dL   Creatinine, Ser 5.68 0.44 - 1.00 mg/dL   Calcium 8.8 (L) 8.9 - 10.3 mg/dL   GFR calc non Af Amer >60 >60 mL/min   GFR calc Af Amer >60 >60 mL/min   Anion gap 11 5 - 15  Urinalysis, Routine w reflex microscopic Urine, Clean Catch  Result Value Ref Range   Color, Urine YELLOW YELLOW   APPearance CLEAR CLEAR   Specific Gravity, Urine >1.030 (H) 1.005 - 1.030   pH 6.0 5.0 - 8.0   Glucose, UA >=500 (A) NEGATIVE mg/dL   Hgb urine dipstick TRACE (A) NEGATIVE   Bilirubin Urine NEGATIVE NEGATIVE   Ketones, ur NEGATIVE NEGATIVE mg/dL   Protein, ur 30 (A) NEGATIVE mg/dL   Nitrite NEGATIVE NEGATIVE   Leukocytes,Ua NEGATIVE NEGATIVE  Urinalysis, Microscopic (reflex)  Result Value Ref Range   RBC / HPF 0-5 0 - 5 RBC/hpf   WBC, UA 0-5 0 - 5 WBC/hpf   Bacteria, UA MANY (A) NONE SEEN   Squamous Epithelial / LPF 0-5 0 - 5   Mucus PRESENT    Hyaline Casts, UA PRESENT    Granular Casts, UA PRESENT    WBC Casts, UA PRESENT   POC CBG, ED  Result Value Ref Range   Glucose-Capillary 123 (H) 70 - 99 mg/dL   No results found.  Radiology No results found.  Procedures Procedures (including critical care time)  Medications Ordered in ED Medications  sulfamethoxazole-trimethoprim (BACTRIM DS) 800-160 MG per tablet 1 tablet (has no administration in time range)  sodium chloride 0.9 % bolus 500 mL (500 mLs Intravenous New Bag/Given 05/04/20 0257)    ED Course  I have reviewed the triage vital signs and  the nursing notes.  Pertinent labs & imaging results that were available during my care of the patient were reviewed by me and considered in my medical decision making (see chart for details).    Patient is not in DKA, normal sugar and anion gap.  Though I have advised that the patient not drinking alcohol which will adversely affect her sugar.  The frequency is secondary to alcohol turning off anti-diuretic hormone in addition to a UTI.  We will treat this with bactrim as the patient is not allergic.  Strict return precautions given.    Beverly Wells was evaluated in Emergency Department on 05/04/2020 for the symptoms described in the history of present illness. She was evaluated in the context of the global COVID-19 pandemic, which necessitated consideration that the patient might be at risk for infection with the SARS-CoV-2 virus that causes COVID-19. Institutional protocols and algorithms that pertain to the evaluation of patients at risk for COVID-19 are in a state of rapid change based on information released by regulatory bodies including the CDC and federal and state organizations. These policies and algorithms were followed during the patient's care in the ED.  Final Clinical Impression(s) / ED Diagnoses Final diagnoses:  Urinary tract infection without hematuria, site unspecified  Alcoholic intoxication  without complication (HCC)   Return for intractable cough, coughing up blood,fevers >100.4 unrelieved by medication, shortness of breath, intractable vomiting, chest pain, shortness of breath, weakness,numbness, changes in speech, facial asymmetry,abdominal pain, passing out,Inability to tolerate liquids or food, cough, altered mental status or any concerns. No signs of systemic illness or infection. The patient is nontoxic-appearing on exam and vital signs are within normal limits.   I have reviewed the triage vital signs and the nursing notes. Pertinent labs &imaging  results that were available during my care of the patient were reviewed by me and considered in my medical decision making (see chart for details).After history, exam, and medical workup I feel the patient has beenappropriately medically screened and is safe for discharge home. Pertinent diagnoses were discussed with the patient. Patient was given return precautions.  Rx / DC Orders ED Discharge Orders         Ordered    sulfamethoxazole-trimethoprim (BACTRIM DS) 800-160 MG tablet  2 times daily        05/04/20 0307           Dymin Dingledine, MD 05/04/20 1610

## 2020-06-11 ENCOUNTER — Other Ambulatory Visit: Payer: Self-pay

## 2020-06-11 ENCOUNTER — Emergency Department (HOSPITAL_BASED_OUTPATIENT_CLINIC_OR_DEPARTMENT_OTHER)
Admission: EM | Admit: 2020-06-11 | Discharge: 2020-06-11 | Disposition: A | Discharge status: Home / Self Care | Payer: Self-pay | Attending: Emergency Medicine | Admitting: Emergency Medicine

## 2020-06-11 ENCOUNTER — Encounter (HOSPITAL_BASED_OUTPATIENT_CLINIC_OR_DEPARTMENT_OTHER): Payer: Self-pay | Admitting: *Deleted

## 2020-06-11 ENCOUNTER — Emergency Department (HOSPITAL_BASED_OUTPATIENT_CLINIC_OR_DEPARTMENT_OTHER): Payer: Self-pay

## 2020-06-11 ENCOUNTER — Emergency Department (HOSPITAL_BASED_OUTPATIENT_CLINIC_OR_DEPARTMENT_OTHER): Admission: EM | Admit: 2020-06-11 | Discharge: 2020-06-11 | Discharge status: Against Medical Advice | Payer: Self-pay

## 2020-06-11 DIAGNOSIS — J209 Acute bronchitis, unspecified: Secondary | ICD-10-CM | POA: Insufficient documentation

## 2020-06-11 DIAGNOSIS — Z794 Long term (current) use of insulin: Secondary | ICD-10-CM | POA: Insufficient documentation

## 2020-06-11 DIAGNOSIS — J069 Acute upper respiratory infection, unspecified: Secondary | ICD-10-CM | POA: Insufficient documentation

## 2020-06-11 DIAGNOSIS — F17290 Nicotine dependence, other tobacco product, uncomplicated: Secondary | ICD-10-CM | POA: Insufficient documentation

## 2020-06-11 DIAGNOSIS — Z91010 Allergy to peanuts: Secondary | ICD-10-CM | POA: Insufficient documentation

## 2020-06-11 DIAGNOSIS — Z20822 Contact with and (suspected) exposure to covid-19: Secondary | ICD-10-CM | POA: Insufficient documentation

## 2020-06-11 DIAGNOSIS — E1110 Type 2 diabetes mellitus with ketoacidosis without coma: Secondary | ICD-10-CM | POA: Insufficient documentation

## 2020-06-11 LAB — RESPIRATORY PANEL BY RT PCR (FLU A&B, COVID)
Influenza A by PCR: NEGATIVE
Influenza B by PCR: NEGATIVE
SARS Coronavirus 2 by RT PCR: NEGATIVE

## 2020-06-11 MED ORDER — PREDNISONE 20 MG PO TABS: 40.0000 mg | ORAL_TABLET | Freq: Every day | ORAL | 0 refills | Status: DC

## 2020-06-11 MED ORDER — ALBUTEROL SULFATE HFA 108 (90 BASE) MCG/ACT IN AERS
4.0000 | INHALATION_SPRAY | Freq: Once | RESPIRATORY_TRACT | Status: AC
  Administered 2020-06-11: 4 via RESPIRATORY_TRACT

## 2020-06-11 MED ORDER — PREDNISONE 20 MG PO TABS
40.0000 mg | ORAL_TABLET | Freq: Once | ORAL | Status: AC
  Administered 2020-06-11: 40 mg via ORAL
  Filled 2020-06-11: qty 2

## 2020-06-11 MED ORDER — ALBUTEROL SULFATE HFA 108 (90 BASE) MCG/ACT IN AERS
INHALATION_SPRAY | RESPIRATORY_TRACT | Status: AC
  Filled 2020-06-11: qty 6.7

## 2020-06-11 MED ORDER — DOXYCYCLINE HYCLATE 100 MG PO CAPS: 100.0000 mg | ORAL_CAPSULE | Freq: Two times a day (BID) | ORAL | 0 refills | Status: DC

## 2020-06-11 MED ORDER — DOXYCYCLINE HYCLATE 100 MG PO TABS
100.0000 mg | ORAL_TABLET | Freq: Once | ORAL | Status: AC
  Administered 2020-06-11: 100 mg via ORAL
  Filled 2020-06-11: qty 1

## 2020-06-11 NOTE — ED Triage Notes (Signed)
C/o covid symptoms x 3 days , fever body aches, cough congestion fatigue

## 2020-06-11 NOTE — ED Provider Notes (Signed)
[facility] EMERGENCY DEPARTMENT Provider Note   CSN: [id number] Arrival date & time: 06/11/20  2047     History Chief Complaint  Patient presents with  . URI    Beverly Wells is a 29 y.o. female.  29 year old female with past medical history including IDDM who presents with cough.  Patient states that she has had 2 days of cough associated with runny nose, subjective fevers, vomiting and diarrhea.  She has been able to keep fluids down.  She reports that her blood glucose has been under excellent control recently.  She denies any rash, sick contacts, or recent travel.  She does feel some shortness of breath when she tries to take a deep breath in, which causes her to cough significantly.  She states this feels like previous episodes of bronchitis that she has had in the past. Last BG checked this evening was 200.  The history is provided by the patient.  URI      Past Medical History:  Diagnosis Date  . ALLERGIC RHINITIS 05/07/2010  . Diabetes mellitus without complication (HCC)   . DKA (diabetic ketoacidoses)   . 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 PERS HX NONCOMPLIANCE W/MED TX PRS HAZARDS HLTH 08/24/2010    Patient Active Problem List   Diagnosis Date Noted  . Anemia 09/07/2013  . Intractable vomiting 06/07/2012  . Lung nodule seen on imaging study 06/07/2012  . Dental abscess 01/19/2012  . DKA (diabetic ketoacidoses) 10/26/2011  . URI (upper respiratory infection) 10/26/2011  . ALLERGIC RHINITIS 05/07/2010  . 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 TONSILLECTOMY       OB History    Gravida  0   Para  0   Term  0   Preterm  0   AB  0   Living  0     SAB  0   TAB  0   Ectopic  0   Multiple  0   Live Births  0           Family History  Problem Relation Age of Onset  . Other Mother   . Other Father   . Diabetes Neg Hx     Social History   Tobacco Use  . Smoking status: Current Every Day Smoker     Types: E-cigarettes  . Smokeless tobacco: Never Used  Substance Use Topics  . Alcohol use: Yes    Comment: occasional   . Drug use: No    Home Medications Prior to Admission medications   Medication Sig Start Date End Date Taking? Authorizing Provider  cetirizine (ZYRTEC) 10 MG tablet Take 10 mg by mouth in the morning and at bedtime.     [provider]  doxycycline (VIBRAMYCIN) 100 MG capsule Take 1 capsule (100 mg total) by mouth 2 (two) times daily. 06/11/20   Shamina Etheridge, Ambrose Finland, MD  Ferrous Sulfate (IRON PO) Take 1 tablet by mouth daily.    [provider]  insulin NPH Human (NOVOLIN N) 100 UNIT/ML injection Inject into the skin in the morning and at bedtime. 40mg  AM 20 lunch  40mg  HS    [provider]  predniSONE (DELTASONE) 20 MG tablet Take 2 tablets (40 mg total) by mouth daily. 06/11/20   Tru Rana, , MD    Allergies    Insulins, Penicillins, Banana, Orange concentrate [flavoring agent], Peanut butter flavor, and Penicillins  Review of Systems   Review of Systems All other  systems reviewed and are negative except that which was mentioned in HPI  Physical Exam Updated Vital Signs BP (!) 146/80   Pulse (!) 102   Temp 98.6 F (37 C) (Oral)   Resp 20   LMP 05/21/2020   SpO2 96%   Physical Exam Vitals and nursing note reviewed.  Constitutional:      General: She is not in acute distress.    Appearance: She is well-developed.  HENT:     Head: Normocephalic and atraumatic.     Mouth/Throat:     Mouth: Mucous membranes are moist.     Comments: Very mild erythema posterior oropharynx Eyes:     Conjunctiva/sclera: Conjunctivae normal.  Cardiovascular:     Rate and Rhythm: Normal rate and regular rhythm.     Heart sounds: Normal heart sounds. No murmur heard.   Pulmonary:     Effort: Pulmonary effort is normal.     Breath sounds: Wheezing present.     Comments: Diffuse wheezes b/l but normal work of breathing Abdominal:      General: Bowel sounds are normal. There is no distension.     Palpations: Abdomen is soft.     Tenderness: There is no abdominal tenderness.  Musculoskeletal:     Cervical back: Neck supple.  Skin:    General: Skin is warm and dry.  Neurological:     Mental Status: She is alert and oriented to person, place, and time.     Comments: Fluent speech  Psychiatric:        Judgment: Judgment normal.     ED Results / Procedures / Treatments   Labs (all labs ordered are listed, but only abnormal results are displayed) Labs Reviewed  RESPIRATORY PANEL BY RT PCR (FLU A&B, COVID)    EKG None  Radiology DG Chest Portable 1 View  Result Date: 06/11/2020 CLINICAL DATA:  Fever and cough EXAM: PORTABLE CHEST 1 VIEW COMPARISON:  None. FINDINGS: The heart size and mediastinal contours are within normal limits. Mildly hazy airspace opacities seen at the right lower lung. The visualized skeletal structures are unremarkable. IMPRESSION: Mildly hazy airspace opacity at the right lower lung which could be due to atelectasis and/or early infectious etiology. Electronically Signed   By: Jonna Clark M.D.   On: 06/11/2020 21:28    Procedures Procedures (including critical care time)  Medications Ordered in ED Medications  predniSONE (DELTASONE) tablet 40 mg (has no administration in time range)  doxycycline (VIBRA-TABS) tablet 100 mg (has no administration in time range)  albuterol (VENTOLIN HFA) 108 (90 Base) MCG/ACT inhaler 4 puff (4 puffs Inhalation Given 06/11/20 2220)    ED Course  I have reviewed the triage vital signs and the nursing notes.  Pertinent labs & imaging results that were available during my care of the patient were reviewed by me and considered in my medical decision making (see chart for details).    MDM Rules/Calculators/A&P                          PT w/ wheezing on exam but breathing comfortably on RA, reassuring VS. I recommended steroids and albuterol but I did  caution that steroids will drive up BG at home and she will need extra tight BG control during illness. COVID/Flu/RSV negative. CXR w/ RLL atelectasis vs early infiltrate. Because of IDDM, will cover for infection w/ doxycycline. Discussed supportive measures.  Extensively reviewed return precautions with her and she voiced understanding. Final  Clinical Impression(s) / ED Diagnoses Final diagnoses:  Acute bronchitis, unspecified organism  Viral URI with cough    Rx / DC Orders ED Discharge Orders         Ordered    predniSONE (DELTASONE) 20 MG tablet  Daily        06/11/20 2256    doxycycline (VIBRAMYCIN) 100 MG capsule  2 times daily        06/11/20 2256           Ekansh Sherk, Ambrose Finland, MD 06/11/20 2300

## 2020-07-10 ENCOUNTER — Encounter (HOSPITAL_BASED_OUTPATIENT_CLINIC_OR_DEPARTMENT_OTHER): Payer: Self-pay | Admitting: Emergency Medicine

## 2020-07-10 ENCOUNTER — Other Ambulatory Visit: Payer: Self-pay

## 2020-07-10 ENCOUNTER — Emergency Department (HOSPITAL_BASED_OUTPATIENT_CLINIC_OR_DEPARTMENT_OTHER)
Admission: EM | Admit: 2020-07-10 | Discharge: 2020-07-11 | Disposition: A | Discharge status: Home / Self Care | Payer: Self-pay | Attending: Emergency Medicine | Admitting: Emergency Medicine

## 2020-07-10 DIAGNOSIS — R112 Nausea with vomiting, unspecified: Secondary | ICD-10-CM | POA: Insufficient documentation

## 2020-07-10 DIAGNOSIS — F17210 Nicotine dependence, cigarettes, uncomplicated: Secondary | ICD-10-CM | POA: Insufficient documentation

## 2020-07-10 DIAGNOSIS — E1110 Type 2 diabetes mellitus with ketoacidosis without coma: Secondary | ICD-10-CM | POA: Insufficient documentation

## 2020-07-10 DIAGNOSIS — R509 Fever, unspecified: Secondary | ICD-10-CM | POA: Insufficient documentation

## 2020-07-10 DIAGNOSIS — R300 Dysuria: Secondary | ICD-10-CM | POA: Insufficient documentation

## 2020-07-10 DIAGNOSIS — Z794 Long term (current) use of insulin: Secondary | ICD-10-CM | POA: Insufficient documentation

## 2020-07-10 DIAGNOSIS — M545 Low back pain, unspecified: Secondary | ICD-10-CM | POA: Insufficient documentation

## 2020-07-10 LAB — URINALYSIS, ROUTINE W REFLEX MICROSCOPIC
Bilirubin Urine: NEGATIVE
Glucose, UA: 250 mg/dL — AB
Ketones, ur: NEGATIVE mg/dL
Leukocytes,Ua: NEGATIVE
Nitrite: NEGATIVE
Protein, ur: 100 mg/dL — AB
Specific Gravity, Urine: 1.025 (ref 1.005–1.030)
pH: 7.5 (ref 5.0–8.0)

## 2020-07-10 LAB — PREGNANCY, URINE: Preg Test, Ur: NEGATIVE

## 2020-07-10 LAB — URINALYSIS, MICROSCOPIC (REFLEX)

## 2020-07-10 LAB — CBG MONITORING, ED: Glucose-Capillary: 142 mg/dL — ABNORMAL HIGH (ref 70–99)

## 2020-07-10 MED ORDER — SODIUM CHLORIDE 0.9 % IV BOLUS (SEPSIS)
1000.0000 mL | Freq: Once | INTRAVENOUS | Status: AC
  Administered 2020-07-11: 1000 mL via INTRAVENOUS

## 2020-07-10 MED ORDER — ONDANSETRON HCL 4 MG/2ML IJ SOLN
4.0000 mg | Freq: Once | INTRAMUSCULAR | Status: AC
  Administered 2020-07-11: 4 mg via INTRAVENOUS
  Filled 2020-07-10: qty 2

## 2020-07-10 MED ORDER — KETOROLAC TROMETHAMINE 30 MG/ML IJ SOLN
30.0000 mg | Freq: Once | INTRAMUSCULAR | Status: AC
  Administered 2020-07-11: 30 mg via INTRAVENOUS
  Filled 2020-07-10: qty 1

## 2020-07-10 NOTE — ED Triage Notes (Signed)
Pt c/o lower back pain and "feeling bad"

## 2020-07-10 NOTE — ED Provider Notes (Signed)
TIME SEEN: 11:25 PM   CHIEF COMPLAINT: Lower back pain, not feeling well  HPI: Patient is a 29 year old female with history of insulin-dependent diabetes who presents to the emergency department not feeling well today and having lower back pain.  Pain is worse with movement.  No injury to the back.  Reports subjective fevers at home, nausea or vomiting.  No diarrhea.  Having some dysuria without hematuria.  No vaginal bleeding or discharge.  Patient is not vaccinated for COVID 19.  Had a negative COVID test 2 days ago.  No history of IV drug abuse. No history of back surgeries. No numbness or focal weakness.  Patient reports that she was seen in emergency department in [place] last week and was diagnosed with a UTI and started on antibiotics. States she did not finish the antibiotics because they gave her vaginal irritation. Denies history of kidney stones. Not currently on her menstrual cycle.  ROS: See HPI Constitutional:  fever  Eyes: no drainage  ENT: no runny nose   Cardiovascular:  no chest pain  Resp: no SOB  GI: no vomiting GU:  dysuria Integumentary: no rash  Allergy: no hives  Musculoskeletal: no leg swelling  Neurological: no slurred speech ROS otherwise negative  PAST MEDICAL HISTORY/PAST SURGICAL HISTORY:  Past Medical History:  Diagnosis Date  . ALLERGIC RHINITIS 05/07/2010  . Diabetes mellitus without complication (HCC)   . DKA (diabetic ketoacidoses)   . 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 PERS HX NONCOMPLIANCE W/MED TX PRS HAZARDS HLTH 08/24/2010    MEDICATIONS:  Prior to Admission medications   Medication Sig Start Date End Date Taking? Authorizing Provider  cetirizine (ZYRTEC) 10 MG tablet Take 10 mg by mouth in the morning and at bedtime.     [provider]  Ferrous Sulfate (IRON PO) Take 1 tablet by mouth daily.    [provider]  insulin NPH Human (NOVOLIN N) 100 UNIT/ML injection Inject into the skin in the morning and at bedtime. 40mg  AM 20 lunch  40mg   HS    [provider]    ALLERGIES:  Allergies  Allergen Reactions  . Insulins Anaphylaxis    Can ONLY take NPH  . Penicillins Anaphylaxis    Has patient had a PCN reaction causing immediate rash, facial/tongue/throat swelling, SOB or lightheadedness with hypotension: yes Has patient had a PCN reaction causing severe rash involving mucus membranes or skin necrosis: no Has patient had a PCN reaction that required hospitalization: yes Has patient had a PCN reaction occurring within the last 10 years: no If all of the above answers are "NO", then may proceed with Cephalosporin use.   . Banana Hives    Peas and cauliflower  . Orange Concentrate [Flavoring Agent] Hives  . Peanut Butter Flavor   . Penicillins Swelling    hives    SOCIAL HISTORY:  Social History   Tobacco Use  . Smoking status: Current Every Day Smoker    Types: E-cigarettes  . Smokeless tobacco: Never Used  Substance Use Topics  . Alcohol use: Yes    Comment: occasional     FAMILY HISTORY: Family History  Problem Relation Age of Onset  . Other Mother   . Other Father   . Diabetes Neg Hx     EXAM: BP 118/87   Pulse (!) 113   Temp 99.5 F (37.5 C) (Oral)   Resp (!) 22   Ht 5\' 7"  (1.702 m)   Wt 70.3 kg   BMI 24.28 kg/m  CONSTITUTIONAL: Alert and oriented and responds appropriately to questions. Well-appearing; well-nourished HEAD: Normocephalic EYES: Conjunctivae clear, pupils appear equal, EOM appear intact ENT: normal nose; moist mucous membranes NECK: Supple, normal ROM CARD: Regular and tachycardic; S1 and S2 appreciated; no murmurs, no clicks, no rubs, no gallops RESP: Normal chest excursion without splinting or tachypnea; breath sounds clear and equal bilaterally; no wheezes, no rhonchi, no rales, no hypoxia or respiratory distress, speaking full sentences ABD/GI: Normal bowel sounds; non-distended; soft, non-tender, no rebound, no guarding, no peritoneal signs, no  hepatosplenomegaly BACK:  The back appears normal, tender to palpation over the lumbar paraspinal muscles without redness, warmth, soft tissue swelling, rash or other lesions; no midline spinal tenderness or step off or deformity EXT: Normal ROM in all joints; no deformity noted, no edema; no cyanosis SKIN: Normal color for age and race; warm; no rash on exposed skin NEURO: Moves all extremities equally; ambulates with normal gait, normal sensation diffusely PSYCH: The patient's mood and manner are appropriate.   MEDICAL DECISION MAKING: Patient here with complaints of not feeling well.  States she feels this is like a kidney infection she has had previously.  No history of kidney stones.  May also be musculoskeletal pain.  She is slightly tachycardic and tachypneic here with a temperature of 99.5.  Have offered COVID-19 testing which she declined stating that she was tested 2 days ago and was negative.  Will check labs, urine.  Abdominal exam benign.  She has no CVA tenderness on exam.  No risk factors to suggest epidural abscess, discitis or osteomyelitis.  No midline tenderness.  ED PROGRESS: Urine does not appear infected but does show blood. We will proceed with CT renal study to possible kidney stones.  Ct shows no kidney stone or other acute abnormality.  Patient reports feeling better.  Have recommended alternating Tylenol and Motrin at home, rest, increase fluid intake.  Urine culture is pending.  Discussed with patient that she will be contacted if she needs to start antibiotics based on her urine culture.  I do not think they need to be started at this time based on hematuria alone.  Pain may be musculoskeletal in nature given it is painful for her to move around but she is walking normally with a normal neuro exam.  I do not feel she needs emergent MRI of her spine.  Have again offered her COVID-19 testing given she is unvaccinated but she declines.  Will discharge home.  At this time, I do  not feel there is any life-threatening condition present. I have reviewed, interpreted and discussed all results (EKG, imaging, lab, urine as appropriate) and exam findings with patient/family. I have reviewed nursing notes and appropriate previous records.  I feel the patient is safe to be discharged home without further emergent workup and can continue workup as an outpatient as needed. Discussed usual and customary return precautions. Patient/family verbalize understanding and are comfortable with this plan.  Outpatient follow-up has been provided as needed. All questions have been answered.     Beverly Wells was evaluated in Emergency Department on 07/10/2020 for the symptoms described in the history of present illness. She was evaluated in the context of the global COVID-19 pandemic, which necessitated consideration that the patient might be at risk for infection with the SARS-CoV-2 virus that causes COVID-19. Institutional protocols and algorithms that pertain to the evaluation of patients at risk for COVID-19 are in a state of rapid change based on information released by  regulatory bodies including the CDC and federal and state organizations. These policies and algorithms were followed during the patient's care in the ED.      Valisa Karpel, Layla Maw, DO 07/11/20 0101

## 2020-07-11 ENCOUNTER — Emergency Department (HOSPITAL_BASED_OUTPATIENT_CLINIC_OR_DEPARTMENT_OTHER): Payer: Self-pay

## 2020-07-11 LAB — CBC WITH DIFFERENTIAL/PLATELET
Abs Immature Granulocytes: 0.02 10*3/uL (ref 0.00–0.07)
Basophils Absolute: 0 10*3/uL (ref 0.0–0.1)
Basophils Relative: 0 %
Eosinophils Absolute: 0.2 10*3/uL (ref 0.0–0.5)
Eosinophils Relative: 3 %
HCT: 39.8 % (ref 36.0–46.0)
Hemoglobin: 12.2 g/dL (ref 12.0–15.0)
Immature Granulocytes: 0 %
Lymphocytes Relative: 8 %
Lymphs Abs: 0.6 10*3/uL — ABNORMAL LOW (ref 0.7–4.0)
MCH: 22.9 pg — ABNORMAL LOW (ref 26.0–34.0)
MCHC: 30.7 g/dL (ref 30.0–36.0)
MCV: 74.7 fL — ABNORMAL LOW (ref 80.0–100.0)
Monocytes Absolute: 0.4 10*3/uL (ref 0.1–1.0)
Monocytes Relative: 6 %
Neutro Abs: 5.9 10*3/uL (ref 1.7–7.7)
Neutrophils Relative %: 83 %
Platelets: 291 10*3/uL (ref 150–400)
RBC: 5.33 MIL/uL — ABNORMAL HIGH (ref 3.87–5.11)
RDW: 14.4 % (ref 11.5–15.5)
WBC: 7.1 10*3/uL (ref 4.0–10.5)
nRBC: 0 % (ref 0.0–0.2)

## 2020-07-11 LAB — COMPREHENSIVE METABOLIC PANEL
ALT: 12 U/L (ref 0–44)
AST: 15 U/L (ref 15–41)
Albumin: 3.5 g/dL (ref 3.5–5.0)
Alkaline Phosphatase: 88 U/L (ref 38–126)
Anion gap: 11 (ref 5–15)
BUN: 13 mg/dL (ref 6–20)
CO2: 22 mmol/L (ref 22–32)
Calcium: 9.1 mg/dL (ref 8.9–10.3)
Chloride: 100 mmol/L (ref 98–111)
Creatinine, Ser: 0.58 mg/dL (ref 0.44–1.00)
GFR, Estimated: >60 mL/min (ref >60)
Glucose, Bld: 121 mg/dL — ABNORMAL HIGH (ref 70–99)
Potassium: 4 mmol/L (ref 3.5–5.1)
Sodium: 133 mmol/L — ABNORMAL LOW (ref 135–145)
Total Bilirubin: 0.5 mg/dL (ref 0.3–1.2)
Total Protein: 7.1 g/dL (ref 6.5–8.1)

## 2020-07-11 NOTE — Discharge Instructions (Addendum)
You may alternate Tylenol 1000 mg every 6 hours as needed for pain, fever and Ibuprofen 800 mg every 8 hours as needed for pain, fever.  Please take Ibuprofen with food.  Do not take more than 4000 mg of Tylenol (acetaminophen) in a 24 hour period.  Your labs today were reassuring.  Your blood glucose is normal.  Your urine showed a small amount of blood but no other sign of infection.  We have sent a urine culture.  If this is positive for bacteria, you will be contacted and started on antibiotics.  Your CT scan today was also normal.  I recommend increased rest and fluid intake over the next several days.  Please return to the emergency department if you begin having worsening back pain, vomiting and cannot stop, numbness or weakness in your extremities, or unable to walk.

## 2020-07-11 NOTE — ED Notes (Signed)
ED Provider at bedside. 

## 2020-07-12 LAB — URINE CULTURE: Culture: NO GROWTH

## 2021-02-17 ENCOUNTER — Emergency Department (HOSPITAL_COMMUNITY)
Admission: EM | Admit: 2021-02-17 | Discharge: 2021-02-17 | Disposition: A | Discharge status: Home / Self Care | Payer: Self-pay | Attending: Emergency Medicine | Admitting: Emergency Medicine

## 2021-02-17 ENCOUNTER — Encounter (HOSPITAL_COMMUNITY): Payer: Self-pay

## 2021-02-17 ENCOUNTER — Emergency Department (HOSPITAL_COMMUNITY): Payer: Self-pay

## 2021-02-17 ENCOUNTER — Other Ambulatory Visit: Payer: Self-pay

## 2021-02-17 DIAGNOSIS — M542 Cervicalgia: Secondary | ICD-10-CM | POA: Diagnosis present

## 2021-02-17 DIAGNOSIS — E1110 Type 2 diabetes mellitus with ketoacidosis without coma: Secondary | ICD-10-CM | POA: Insufficient documentation

## 2021-02-17 DIAGNOSIS — Z91010 Allergy to peanuts: Secondary | ICD-10-CM | POA: Insufficient documentation

## 2021-02-17 DIAGNOSIS — Z794 Long term (current) use of insulin: Secondary | ICD-10-CM | POA: Diagnosis not present

## 2021-02-17 DIAGNOSIS — M545 Low back pain, unspecified: Secondary | ICD-10-CM | POA: Diagnosis not present

## 2021-02-17 DIAGNOSIS — Y92410 Unspecified street and highway as the place of occurrence of the external cause: Secondary | ICD-10-CM | POA: Diagnosis not present

## 2021-02-17 DIAGNOSIS — F17290 Nicotine dependence, other tobacco product, uncomplicated: Secondary | ICD-10-CM | POA: Insufficient documentation

## 2021-02-17 MED ORDER — IBUPROFEN 600 MG PO TABS: 600.0000 mg | ORAL_TABLET | Freq: Four times a day (QID) | ORAL | 0 refills | Status: DC | PRN

## 2021-02-17 MED ORDER — CYCLOBENZAPRINE HCL 10 MG PO TABS: 10.0000 mg | ORAL_TABLET | Freq: Two times a day (BID) | ORAL | 0 refills | Status: DC | PRN
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
## 2021-03-09 ENCOUNTER — Inpatient Hospital Stay (HOSPITAL_BASED_OUTPATIENT_CLINIC_OR_DEPARTMENT_OTHER)
Admission: EM | Admit: 2021-03-09 | Discharge: 2021-03-10 | DRG: 639 | Discharge status: Against Medical Advice | Payer: Self-pay | Attending: Internal Medicine | Admitting: Internal Medicine

## 2021-03-09 ENCOUNTER — Other Ambulatory Visit: Payer: Self-pay

## 2021-03-09 DIAGNOSIS — E860 Dehydration: Secondary | ICD-10-CM

## 2021-03-09 DIAGNOSIS — R739 Hyperglycemia, unspecified: Secondary | ICD-10-CM

## 2021-03-09 DIAGNOSIS — E1110 Type 2 diabetes mellitus with ketoacidosis without coma: Secondary | ICD-10-CM | POA: Diagnosis present

## 2021-03-09 DIAGNOSIS — T383X6A Underdosing of insulin and oral hypoglycemic [antidiabetic] drugs, initial encounter: Secondary | ICD-10-CM | POA: Diagnosis present

## 2021-03-09 DIAGNOSIS — Z5329 Procedure and treatment not carried out because of patient's decision for other reasons: Secondary | ICD-10-CM | POA: Diagnosis present

## 2021-03-09 DIAGNOSIS — F17290 Nicotine dependence, other tobacco product, uncomplicated: Secondary | ICD-10-CM | POA: Diagnosis present

## 2021-03-09 DIAGNOSIS — E1010 Type 1 diabetes mellitus with ketoacidosis without coma: Principal | ICD-10-CM | POA: Diagnosis present

## 2021-03-09 DIAGNOSIS — E1310 Other specified diabetes mellitus with ketoacidosis without coma: Secondary | ICD-10-CM

## 2021-03-09 DIAGNOSIS — Z20822 Contact with and (suspected) exposure to covid-19: Secondary | ICD-10-CM | POA: Diagnosis present

## 2021-03-09 DIAGNOSIS — Z880 Allergy status to penicillin: Secondary | ICD-10-CM

## 2021-03-09 DIAGNOSIS — Z91138 Patient's unintentional underdosing of medication regimen for other reason: Secondary | ICD-10-CM

## 2021-03-09 LAB — I-STAT VENOUS BLOOD GAS, ED
Acid-base deficit: 13 mmol/L — ABNORMAL HIGH (ref 0.0–2.0)
Bicarbonate: 12.8 mmol/L — ABNORMAL LOW (ref 20.0–28.0)
Calcium, Ion: 1.15 mmol/L (ref 1.15–1.40)
HCT: 39 % (ref 36.0–46.0)
Hemoglobin: 13.3 g/dL (ref 12.0–15.0)
O2 Saturation: 68 %
Potassium: 4.6 mmol/L (ref 3.5–5.1)
Sodium: 135 mmol/L (ref 135–145)
TCO2: 14 mmol/L — ABNORMAL LOW (ref 22–32)
pCO2, Ven: 30.8 mmHg — ABNORMAL LOW (ref 44.0–60.0)
pH, Ven: 7.228 — ABNORMAL LOW (ref 7.250–7.430)
pO2, Ven: 41 mmHg (ref 32.0–45.0)

## 2021-03-09 LAB — URINALYSIS, ROUTINE W REFLEX MICROSCOPIC
Bilirubin Urine: NEGATIVE
Glucose, UA: >=500 mg/dL — AB
Ketones, ur: >80 mg/dL — AB
Leukocytes,Ua: NEGATIVE
Nitrite: NEGATIVE
Protein, ur: NEGATIVE mg/dL
Specific Gravity, Urine: 1.02 (ref 1.005–1.030)
pH: 5.5 (ref 5.0–8.0)

## 2021-03-09 LAB — RESP PANEL BY RT-PCR (FLU A&B, COVID) ARPGX2
Influenza A by PCR: NEGATIVE
Influenza B by PCR: NEGATIVE
SARS Coronavirus 2 by RT PCR: NEGATIVE

## 2021-03-09 LAB — BASIC METABOLIC PANEL
Anion gap: 23 — ABNORMAL HIGH (ref 5–15)
Anion gap: UNDETERMINED (ref 5–15)
Anion gap: 16 — ABNORMAL HIGH (ref 5–15)
Anion gap: 18 — ABNORMAL HIGH (ref 5–15)
BUN: 15 mg/dL (ref 6–20)
BUN: 9 mg/dL (ref 6–20)
BUN: 12 mg/dL (ref 6–20)
BUN: 12 mg/dL (ref 6–20)
CO2: 13 mmol/L — ABNORMAL LOW (ref 22–32)
CO2: <7 mmol/L — ABNORMAL LOW (ref 22–32)
CO2: 12 mmol/L — ABNORMAL LOW (ref 22–32)
CO2: 11 mmol/L — ABNORMAL LOW (ref 22–32)
Calcium: 8.9 mg/dL (ref 8.9–10.3)
Calcium: 4.2 mg/dL — CL (ref 8.9–10.3)
Calcium: 7.8 mg/dL — ABNORMAL LOW (ref 8.9–10.3)
Calcium: 8.7 mg/dL — ABNORMAL LOW (ref 8.9–10.3)
Chloride: 98 mmol/L (ref 98–111)
Chloride: 124 mmol/L — ABNORMAL HIGH (ref 98–111)
Chloride: 107 mmol/L (ref 98–111)
Chloride: 108 mmol/L (ref 98–111)
Creatinine, Ser: 1.11 mg/dL — ABNORMAL HIGH (ref 0.44–1.00)
Creatinine, Ser: 0.42 mg/dL — ABNORMAL LOW (ref 0.44–1.00)
Creatinine, Ser: 0.85 mg/dL (ref 0.44–1.00)
Creatinine, Ser: 0.87 mg/dL (ref 0.44–1.00)
GFR, Estimated: >60 mL/min (ref >60)
GFR, Estimated: >60 mL/min (ref >60)
GFR, Estimated: >60 mL/min (ref >60)
GFR, Estimated: >60 mL/min (ref >60)
Glucose, Bld: 601 mg/dL (ref 70–99)
Glucose, Bld: 331 mg/dL — ABNORMAL HIGH (ref 70–99)
Glucose, Bld: 216 mg/dL — ABNORMAL HIGH (ref 70–99)
Glucose, Bld: 211 mg/dL — ABNORMAL HIGH (ref 70–99)
Potassium: 4.5 mmol/L (ref 3.5–5.1)
Potassium: 2.6 mmol/L — CL (ref 3.5–5.1)
Potassium: 4.4 mmol/L (ref 3.5–5.1)
Potassium: 4.3 mmol/L (ref 3.5–5.1)
Sodium: 134 mmol/L — ABNORMAL LOW (ref 135–145)
Sodium: 141 mmol/L (ref 135–145)
Sodium: 135 mmol/L (ref 135–145)
Sodium: 137 mmol/L (ref 135–145)

## 2021-03-09 LAB — CBG MONITORING, ED
Glucose-Capillary: 547 mg/dL (ref 70–99)
Glucose-Capillary: 577 mg/dL (ref 70–99)
Glucose-Capillary: 533 mg/dL (ref 70–99)
Glucose-Capillary: 330 mg/dL — ABNORMAL HIGH (ref 70–99)
Glucose-Capillary: 272 mg/dL — ABNORMAL HIGH (ref 70–99)
Glucose-Capillary: 203 mg/dL — ABNORMAL HIGH (ref 70–99)

## 2021-03-09 LAB — CBC
HCT: 38 % (ref 36.0–46.0)
Hemoglobin: 11.3 g/dL — ABNORMAL LOW (ref 12.0–15.0)
MCH: 22.6 pg — ABNORMAL LOW (ref 26.0–34.0)
MCHC: 29.7 g/dL — ABNORMAL LOW (ref 30.0–36.0)
MCV: 76 fL — ABNORMAL LOW (ref 80.0–100.0)
Platelets: 356 10*3/uL (ref 150–400)
RBC: 5 MIL/uL (ref 3.87–5.11)
RDW: 16.8 % — ABNORMAL HIGH (ref 11.5–15.5)
WBC: 13.3 10*3/uL — ABNORMAL HIGH (ref 4.0–10.5)
nRBC: 0 % (ref 0.0–0.2)

## 2021-03-09 LAB — GLUCOSE, CAPILLARY
Glucose-Capillary: 163 mg/dL — ABNORMAL HIGH (ref 70–99)
Glucose-Capillary: 206 mg/dL — ABNORMAL HIGH (ref 70–99)
Glucose-Capillary: 208 mg/dL — ABNORMAL HIGH (ref 70–99)
Glucose-Capillary: 209 mg/dL — ABNORMAL HIGH (ref 70–99)
Glucose-Capillary: 233 mg/dL — ABNORMAL HIGH (ref 70–99)
Glucose-Capillary: 302 mg/dL — ABNORMAL HIGH (ref 70–99)

## 2021-03-09 LAB — LIPASE, BLOOD: Lipase: 20 U/L (ref 11–51)

## 2021-03-09 LAB — HEPATIC FUNCTION PANEL
ALT: 31 U/L (ref 0–44)
AST: 40 U/L (ref 15–41)
Albumin: 3.7 g/dL (ref 3.5–5.0)
Alkaline Phosphatase: 117 U/L (ref 38–126)
Bilirubin, Direct: 0.1 mg/dL (ref 0.0–0.2)
Indirect Bilirubin: 1.5 mg/dL — ABNORMAL HIGH (ref 0.3–0.9)
Total Bilirubin: 1.6 mg/dL — ABNORMAL HIGH (ref 0.3–1.2)
Total Protein: 8.1 g/dL (ref 6.5–8.1)

## 2021-03-09 LAB — MRSA NEXT GEN BY PCR, NASAL: MRSA by PCR Next Gen: NOT DETECTED

## 2021-03-09 LAB — BETA-HYDROXYBUTYRIC ACID: Beta-Hydroxybutyric Acid: >8 mmol/L — ABNORMAL HIGH (ref 0.05–0.27)

## 2021-03-09 LAB — URINALYSIS, MICROSCOPIC (REFLEX)

## 2021-03-09 LAB — PREGNANCY, URINE: Preg Test, Ur: NEGATIVE

## 2021-03-09 MED ORDER — POTASSIUM CHLORIDE 10 MEQ/100ML IV SOLN
10.0000 meq | INTRAVENOUS | Status: AC
  Administered 2021-03-09 (×2): 10 meq via INTRAVENOUS
  Filled 2021-03-09 (×2): qty 100

## 2021-03-09 MED ORDER — INSULIN REGULAR(HUMAN) IN NACL 100-0.9 UT/100ML-% IV SOLN
INTRAVENOUS | Status: DC
  Administered 2021-03-09: 5 [IU]/h via INTRAVENOUS
  Filled 2021-03-09: qty 100

## 2021-03-09 MED ORDER — ACETAMINOPHEN 325 MG PO TABS: 650.0000 mg | ORAL_TABLET | Freq: Four times a day (QID) | ORAL | Status: DC | PRN

## 2021-03-09 MED ORDER — IBUPROFEN 200 MG PO TABS
600.0000 mg | ORAL_TABLET | Freq: Four times a day (QID) | ORAL | Status: DC | PRN
  Administered 2021-03-09 – 2021-03-10 (×3): 600 mg via ORAL
  Filled 2021-03-09 (×3): qty 3

## 2021-03-09 MED ORDER — CHLORHEXIDINE GLUCONATE CLOTH 2 % EX PADS
6.0000 | MEDICATED_PAD | Freq: Every day | CUTANEOUS | Status: DC
  Administered 2021-03-09: 6 via TOPICAL

## 2021-03-09 MED ORDER — DEXTROSE 50 % IV SOLN: 0.0000 mL | INTRAVENOUS | Status: DC | PRN

## 2021-03-09 MED ORDER — SODIUM CHLORIDE 0.9 % IV BOLUS
1000.0000 mL | Freq: Once | INTRAVENOUS | Status: AC
  Administered 2021-03-09: 1000 mL via INTRAVENOUS

## 2021-03-09 MED ORDER — ENOXAPARIN SODIUM 40 MG/0.4ML IJ SOSY
40.0000 mg | PREFILLED_SYRINGE | INTRAMUSCULAR | Status: DC
  Administered 2021-03-09: 40 mg via SUBCUTANEOUS
  Filled 2021-03-09: qty 0.4

## 2021-03-09 MED ORDER — LACTATED RINGERS IV SOLN: INTRAVENOUS | Status: DC

## 2021-03-09 MED ORDER — DEXTROSE IN LACTATED RINGERS 5 % IV SOLN: INTRAVENOUS | Status: DC

## 2021-03-09 MED ORDER — ONDANSETRON HCL 4 MG/2ML IJ SOLN
4.0000 mg | Freq: Four times a day (QID) | INTRAMUSCULAR | Status: DC | PRN
  Administered 2021-03-09 – 2021-03-10 (×2): 4 mg via INTRAVENOUS
  Filled 2021-03-09 (×2): qty 2

## 2021-03-09 MED ORDER — ONDANSETRON HCL 4 MG/2ML IJ SOLN
4.0000 mg | Freq: Once | INTRAMUSCULAR | Status: AC
  Administered 2021-03-09: 4 mg via INTRAVENOUS
  Filled 2021-03-09: qty 2

## 2021-03-09 MED ORDER — LACTATED RINGERS IV BOLUS
20.0000 mL/kg | Freq: Once | INTRAVENOUS | Status: AC
  Administered 2021-03-09: 1406 mL via INTRAVENOUS

## 2021-03-09 NOTE — Plan of Care (Signed)
Educated patient on DKA and insulin. Needs reinforcement, patient in pain and drowsy at this time.

## 2021-03-09 NOTE — ED Provider Notes (Signed)
[facility] EMERGENCY DEPARTMENT Provider Note   CSN: [id number] Arrival date & time: 03/09/21  1021     History Chief Complaint  Patient presents with   Hyperglycemia    Beverly Wells Beverly Wells is a 30 y.o. female.  The history is provided by the patient and medical records. The history is limited by the condition of the patient.  Hyperglycemia Blood sugar level PTA:  High Severity:  Severe Onset quality:  Gradual Duration:  3 days Timing:  Constant Progression:  Worsening Chronicity:  Recurrent Diabetes status:  Controlled with insulin Time since last antidiabetic medication: several. Context: noncompliance   Context comment:  Ran out of insulin recently Ineffective treatments:  None tried Associated symptoms: abdominal pain (cramping with elesis), dehydration, fatigue, malaise, nausea and vomiting   Associated symptoms: no altered mental status, no chest pain, no confusion, no dysuria, no fever, no shortness of breath and no weakness   Risk factors: hx of DKA       Past Medical History:  Diagnosis Date   ALLERGIC RHINITIS 05/07/2010   Diabetes mellitus without complication (HCC)    DKA (diabetic ketoacidoses)    IDDM 08/05/2009   PERS HX NONCOMPLIANCE W/MED TX PRS HAZARDS HLTH 08/24/2010    Patient Active Problem List   Diagnosis Date Noted   Anemia 09/07/2013   Intractable vomiting 06/07/2012   Lung nodule seen on imaging study 06/07/2012   Dental abscess 01/19/2012   DKA (diabetic ketoacidoses) 10/26/2011   URI (upper respiratory infection) 10/26/2011   ALLERGIC RHINITIS 05/07/2010   IDDM 08/05/2009    Past Surgical History:  Procedure Laterality Date   TONSILLECTOMY       OB History     Gravida  0   Para  0   Term  0   Preterm  0   AB  0   Living  0      SAB  0   IAB  0   Ectopic  0   Multiple  0   Live Births  0           Family History  Problem Relation Age of Onset   Other Mother    Other Father     Diabetes Neg Hx     Social History   Tobacco Use   Smoking status: Every Day    Types: E-cigarettes   Smokeless tobacco: Never  Substance Use Topics   Alcohol use: Yes    Comment: occasional    Drug use: No    Home Medications Prior to Admission medications   Medication Sig Start Date End Date Taking? Authorizing Provider  cetirizine (ZYRTEC) 10 MG tablet Take 10 mg by mouth in the morning and at bedtime.     [provider]  cyclobenzaprine (FLEXERIL) 10 MG tablet Take 1 tablet (10 mg total) by mouth 2 (two) times daily as needed for muscle spasms. 02/17/21   Fayrene Helper, PA-C  Ferrous Sulfate (IRON PO) Take 1 tablet by mouth daily.    [provider]  ibuprofen (ADVIL) 600 MG tablet Take 1 tablet (600 mg total) by mouth every 6 (six) hours as needed for moderate pain. 02/17/21   Fayrene Helper, PA-C  insulin NPH Human (NOVOLIN N) 100 UNIT/ML injection Inject into the skin in the morning and at bedtime. 40mg  AM 20 lunch  40mg  HS    [provider]    Allergies    Insulins, Penicillins, Banana, Orange concentrate [flavoring agent], Peanut butter flavor, and Penicillins  Review of Systems   Review of Systems  Constitutional:  Positive for fatigue. Negative for chills and fever.  HENT:  Negative for congestion.   Eyes:  Negative for visual disturbance.  Respiratory:  Negative for cough, chest tightness and shortness of breath.   Cardiovascular:  Negative for chest pain, palpitations and leg swelling.  Gastrointestinal:  Positive for abdominal pain (cramping with elesis), nausea and vomiting. Negative for constipation and diarrhea.  Genitourinary:  Negative for dysuria, flank pain and frequency.  Musculoskeletal:  Negative for back pain.  Skin:  Negative for rash and wound.  Neurological:  Positive for light-headedness. Negative for seizures, weakness and headaches.  Psychiatric/Behavioral:  Negative for confusion.   All other systems reviewed and are  negative.  Physical Exam Updated Vital Signs BP (!) 148/117 (BP Location: Right Arm)   Pulse (!) 102   Resp (!) 32   Ht 5\' 7"  (1.702 m)   Wt 70.3 kg   LMP  (LMP Unknown)   SpO2 100%   BMI 24.27 kg/m   Physical Exam Vitals and nursing note reviewed.  Constitutional:      General: She is not in acute distress.    Appearance: She is well-developed. She is ill-appearing. She is not toxic-appearing or diaphoretic.  HENT:     Head: Normocephalic and atraumatic.     Mouth/Throat:     Mouth: Mucous membranes are dry.     Pharynx: No oropharyngeal exudate or posterior oropharyngeal erythema.  Eyes:     Extraocular Movements: Extraocular movements intact.     Conjunctiva/sclera: Conjunctivae normal.     Pupils: Pupils are equal, round, and reactive to light.  Cardiovascular:     Rate and Rhythm: Regular rhythm. Tachycardia present.     Pulses: Normal pulses.     Heart sounds: No murmur heard. Pulmonary:     Effort: Pulmonary effort is normal. No respiratory distress.     Breath sounds: Normal breath sounds. No wheezing, rhonchi or rales.  Chest:     Chest wall: No tenderness.  Abdominal:     General: Abdomen is flat.     Palpations: Abdomen is soft.     Tenderness: There is no abdominal tenderness. There is no guarding or rebound.  Musculoskeletal:        General: No tenderness.     Cervical back: Neck supple.     Right lower leg: No edema.     Left lower leg: No edema.  Skin:    General: Skin is warm and dry.     Findings: No erythema.  Neurological:     General: No focal deficit present.     Comments: Patient appears somnolent but is answering questions appropriately.    ED Results / Procedures / Treatments   Labs (all labs ordered are listed, but only abnormal results are displayed) Labs Reviewed  BASIC METABOLIC PANEL - Abnormal; Notable for the following components:      Result Value   Sodium 134 (*)    CO2 13 (*)    Glucose, Bld 601 (*)    Creatinine, Ser  1.11 (*)    Anion gap 23 (*)    All other components within normal limits  CBC - Abnormal; Notable for the following components:   WBC 13.3 (*)    Hemoglobin 11.3 (*)    MCV 76.0 (*)    MCH 22.6 (*)    MCHC 29.7 (*)    RDW 16.8 (*)    All other components within  normal limits  URINALYSIS, ROUTINE W REFLEX MICROSCOPIC - Abnormal; Notable for the following components:   Glucose, UA >=500 (*)    Hgb urine dipstick SMALL (*)    Ketones, ur >80 (*)    All other components within normal limits  HEPATIC FUNCTION PANEL - Abnormal; Notable for the following components:   Total Bilirubin 1.6 (*)    Indirect Bilirubin 1.5 (*)    All other components within normal limits  URINALYSIS, MICROSCOPIC (REFLEX) - Abnormal; Notable for the following components:   Bacteria, UA RARE (*)    All other components within normal limits  BASIC METABOLIC PANEL - Abnormal; Notable for the following components:   Potassium 2.6 (*)    Chloride 124 (*)    CO2 <7 (*)    Glucose, Bld 331 (*)    Creatinine, Ser 0.42 (*)    Calcium 4.2 (*)    All other components within normal limits  CBG MONITORING, ED - Abnormal; Notable for the following components:   Glucose-Capillary 547 (*)    All other components within normal limits  CBG MONITORING, ED - Abnormal; Notable for the following components:   Glucose-Capillary 577 (*)    All other components within normal limits  I-STAT VENOUS BLOOD GAS, ED - Abnormal; Notable for the following components:   pH, Ven 7.228 (*)    pCO2, Ven 30.8 (*)    Bicarbonate 12.8 (*)    TCO2 14 (*)    Acid-base deficit 13.0 (*)    All other components within normal limits  CBG MONITORING, ED - Abnormal; Notable for the following components:   Glucose-Capillary 533 (*)    All other components within normal limits  CBG MONITORING, ED - Abnormal; Notable for the following components:   Glucose-Capillary 330 (*)    All other components within normal limits  RESP PANEL BY RT-PCR (FLU  A&B, COVID) ARPGX2  PREGNANCY, URINE  LIPASE, BLOOD  BETA-HYDROXYBUTYRIC ACID  BLOOD GAS, VENOUS  BASIC METABOLIC PANEL  BASIC METABOLIC PANEL  BASIC METABOLIC PANEL    EKG EKG Interpretation  Date/Time:  Monday March 09 2021 11:03:05 EDT Ventricular Rate:  98 PR Interval:  130 QRS Duration: 84 QT Interval:  350 QTC Calculation: 446 R Axis:   83 Text Interpretation: Normal sinus rhythm Possible Left atrial enlargement Borderline ECG When compared to prior, similar appearance. No STEMI Confirmed by Theda Belfastegeler, Chris ([id number]) on 03/09/2021 11:05:08 AM  Radiology No results found.  Procedures Procedures   CRITICAL CARE Performed by: Canary Brimhristopher J Lasean Rahming Total critical care time: 45 minutes Critical care time was exclusive of separately billable procedures and treating other patients. Critical care was necessary to treat or prevent imminent or life-threatening deterioration. Critical care was time spent personally by me on the following activities: development of treatment plan with patient and/or surrogate as well as nursing, discussions with consultants, evaluation of patient's response to treatment, examination of patient, obtaining history from patient or surrogate, ordering and performing treatments and interventions, ordering and review of laboratory studies, ordering and review of radiographic studies, pulse oximetry and re-evaluation of patient's condition.   Medications Ordered in ED Medications  insulin regular, human (MYXREDLIN) 100 units/ 100 mL infusion (3.2 Units/hr Intravenous Rate/Dose Change 03/09/21 1521)  lactated ringers infusion ( Intravenous New Bag/Given 03/09/21 1449)  dextrose 5 % in lactated ringers infusion (has no administration in time range)  dextrose 50 % solution 0-50 mL (has no administration in time range)  sodium chloride 0.9 % bolus 1,000 mL (  0 mLs Intravenous Stopped 03/09/21 1253)  ondansetron (ZOFRAN) injection 4 mg (4 mg Intravenous Given 03/09/21  1119)  lactated ringers bolus 1,406 mL (0 mL/kg  70.3 kg Intravenous Stopped 03/09/21 1449)  potassium chloride 10 mEq in 100 mL IVPB (0 mEq Intravenous Stopped 03/09/21 1448)    ED Course  I have reviewed the triage vital signs and the nursing notes.  Pertinent labs & imaging results that were available during my care of the patient were reviewed by me and considered in my medical decision making (see chart for details).    MDM Rules/Calculators/A&P                           Beverly Wells Beverly Wells is a 30 y.o. female with a past medical history significant for insulin-dependent diabetes with prior DKA who presents with hyperglycemia, fatigue, nausea, vomiting, abdominal cramping, and feeling like she is in DKA.  Patient reports that "I am in DKA" when I first evaluated patient.  She says that she is not her insulin for the last few days and is feeling very dehydrated.  She is had nausea and vomiting for the last day or 2 and is having some abdominal cramping with vomiting.  She denies fevers, chills, ingestion, cough.  Denies any chest pain, palpitations, shortness of breath.  She reports this is all she felt her last episode of DKA which was around a month ago she reports.  She is unable to describe why she has not been able to take her medication but appears ill on arrival.  She is tachycardic and tachypneic and somnolent.  On exam, she is arousable to voice but is very sleepy.  Chest and abdomen were nontender on my exam.  Bowel sounds were appreciated.  Patient is tachypneic and tachycardic and has dry mucous membranes on exam.  Clinical I am very concerned about DKA.  She had a point-of-care glucose which was over 500.  We will get DKA labs and give her some fluids to start with as she is dehydrated.  Anticipate she will need admission if she is found to have DKA.  We will get a COVID swab for likely admission.  We will give her some nausea medicine for her nausea.  Anticipate reassessment  after work-up.  12:22 PM Work-up has continued to return and does confirm DKA.  COVID and flu negative.  Urinalysis does not show infection.  Patient is still appearing miserable and dehydrated.  We will start the Endo tool which patient does confirm she has tolerated in the past despite insulin being in her allergy list.  Patient will be admitted for further management of DKA due to her missing insulin for several days.  She still cannot tell me why she was unable to take it at home and her fianc does not know either.  Patient will be admitted for further management of DKA.   Final Clinical Impression(s) / ED Diagnoses Final diagnoses:  Diabetic ketoacidosis without coma associated with other specified diabetes mellitus (HCC)  Hyperglycemia  Dehydration    Clinical Impression: 1. Diabetic ketoacidosis without coma associated with other specified diabetes mellitus (HCC)   2. Hyperglycemia   3. Dehydration     Disposition: Admit  This note was prepared with assistance of Dragon voice recognition software. Occasional wrong-word or sound-a-like substitutions may have occurred due to the inherent limitations of voice recognition software.     Delia Sitar, Canary Brim, MD 03/09/21 [phone]

## 2021-03-09 NOTE — ED Triage Notes (Signed)
Pt ran out of insulin, hasnt taken in 2 days. Tachypneic, vomiting, abdominal pain

## 2021-03-09 NOTE — Progress Notes (Signed)
Notified by EDP of need for admission d/t DKA. [facility] accepts patient to SDU at [facility]. EDP is to remain responsible for orders/medical decisions while patient is holding at [facility] Upon arrival to [facility], [facility] will assume care. Nursing staff will call patient placement to notify them of patient's arrival so that the proper [facility] member may receive the patient. Thank you.

## 2021-03-09 NOTE — H&P (Signed)
History and Physical    Bentley Haralson GMW:[id number] DOB: [birth year] DOA: 03/09/2021  PCP: Renaye Rakers, MD  Patient coming from: Home  I have personally briefly reviewed patient's old medical records in [facility]  Chief Complaint: Hyperglycemia  HPI: Beverly Wells is a 30 y.o. female with medical history significant of DM1.  Pt presents to ED at [facility] with c/o 3 day h/o uncontrolled hyperglycemia.  This occurs in the context of having not had insulin while in jail per patient.  After getting out of jail she tried to play "catch up" but has been unsuccessful.  Pt normally takes only NPH as she is allergic to all other insulins (except regular as well it seems today).  Associated abd pain, cramping, vomiting, nausea.  Does have monthly abd pain, N/V associated with menstruation as well.   ED Course: Pt found to be in DKA.  IVF bolus and insulin gtt started.   Review of Systems: As per HPI, otherwise all review of systems negative.  Past Medical History:  Diagnosis Date   ALLERGIC RHINITIS 05/07/2010   Diabetes mellitus without complication (HCC)    DKA (diabetic ketoacidoses)    IDDM 08/05/2009   PERS HX NONCOMPLIANCE W/MED TX PRS HAZARDS HLTH 08/24/2010    Past Surgical History:  Procedure Laterality Date   TONSILLECTOMY       reports that she has been smoking e-cigarettes. She has never used smokeless tobacco. She reports current alcohol use. She reports that she does not use drugs.  Allergies  Allergen Reactions   Insulins Anaphylaxis    Can ONLY take NPH  Tolerating insulin gtt with regular 03/09/21   Penicillins Anaphylaxis    Has patient had a PCN reaction causing immediate rash, facial/tongue/throat swelling, SOB or lightheadedness with hypotension: yes Has patient had a PCN reaction causing severe rash involving mucus membranes or skin necrosis: no Has patient had a PCN reaction that required hospitalization: yes Has patient had a PCN  reaction occurring within the last 10 years: no If all of the above answers are "NO", then may proceed with Cephalosporin use.    Banana Hives    Peas and cauliflower   Orange Concentrate [Flavoring Agent] Hives   Peanut Butter Flavor    Penicillins Swelling    hives    Family History  Problem Relation Age of Onset   Other Mother    Other Father    Diabetes Neg Hx      Prior to Admission medications   Medication Sig Start Date End Date Taking? Authorizing Provider  cetirizine (ZYRTEC) 10 MG tablet Take 10 mg by mouth in the morning and at bedtime.     [provider]  cyclobenzaprine (FLEXERIL) 10 MG tablet Take 1 tablet (10 mg total) by mouth 2 (two) times daily as needed for muscle spasms. 02/17/21   Fayrene Helper, PA-C  Ferrous Sulfate (IRON PO) Take 1 tablet by mouth daily.    [provider]  ibuprofen (ADVIL) 600 MG tablet Take 1 tablet (600 mg total) by mouth every 6 (six) hours as needed for moderate pain. 02/17/21   Fayrene Helper, PA-C  insulin NPH Human (NOVOLIN N) 100 UNIT/ML injection Inject into the skin in the morning and at bedtime. 40mg  AM 20 lunch  40mg  HS    [provider]    Physical Exam: Vitals:   03/09/21 1142 03/09/21 1215 03/09/21 1545 03/09/21 1905  BP: 134/78 (!) 107/51 122/79 138/63  Pulse: 100 (!) 103 99 (!)  108  Resp: 17 18 (!) 25 (!) 24  Temp:    99.1 F (37.3 C)  TempSrc:    Oral  SpO2: 100% 100% 95% 100%  Weight:    71.8 kg  Height:    5\' 7"  (1.702 m)    Constitutional: NAD, calm, comfortable Eyes: PERRL, lids and conjunctivae normal ENMT: Mucous membranes are moist. Posterior pharynx clear of any exudate or lesions.Normal dentition.  Neck: normal, supple, no masses, no thyromegaly Respiratory: clear to auscultation bilaterally, no wheezing, no crackles. Normal respiratory effort. No accessory muscle use.  Cardiovascular: Regular rate and rhythm, no murmurs / rubs / gallops. No extremity edema. 2+ pedal pulses. No  carotid bruits.  Abdomen: no tenderness, no masses palpated. No hepatosplenomegaly. Bowel sounds positive.  Musculoskeletal: no clubbing / cyanosis. No joint deformity upper and lower extremities. Good ROM, no contractures. Normal muscle tone.  Skin: no rashes, lesions, ulcers. No induration Neurologic: CN 2-12 grossly intact. Sensation intact, DTR normal. Strength 5/5 in all 4.  Psychiatric: Normal judgment and insight. Alert and oriented x 3. Normal mood.    Labs on Admission: I have personally reviewed following labs and imaging studies  CBC: Recent Labs  Lab 03/09/21 1105 03/09/21 1120  WBC 13.3*  --   HGB 11.3* 13.3  HCT 38.0 39.0  MCV 76.0*  --   PLT 356  --    Basic Metabolic Panel: Recent Labs  Lab 03/09/21 1105 03/09/21 1120 03/09/21 1217 03/09/21 1803  NA 134* 135 141 135  K 4.5 4.6 2.6* 4.4  CL 98  --  124* 107  CO2 13*  --  <7* 12*  GLUCOSE 601*  --  331* 216*  BUN 15  --  9 12  CREATININE 1.11*  --  0.42* 0.85  CALCIUM 8.9  --  4.2* 7.8*   GFR: Estimated Creatinine Clearance: 95.8 mL/min (by C-G formula based on SCr of 0.85 mg/dL). Liver Function Tests: Recent Labs  Lab 03/09/21 1105  AST 40  ALT 31  ALKPHOS 117  BILITOT 1.6*  PROT 8.1  ALBUMIN 3.7   Recent Labs  Lab 03/09/21 1105  LIPASE 20   No results for input(s): AMMONIA in the last 168 hours. Coagulation Profile: No results for input(s): INR, PROTIME in the last 168 hours. Cardiac Enzymes: No results for input(s): CKTOTAL, CKMB, CKMBINDEX, TROPONINI in the last 168 hours. BNP (last 3 results) No results for input(s): PROBNP in the last 8760 hours. HbA1C: No results for input(s): HGBA1C in the last 72 hours. CBG: Recent Labs  Lab 03/09/21 1358 03/09/21 1513 03/09/21 1623 03/09/21 1804 03/09/21 1908  GLUCAP 533* 330* 272* 203* 302*   Lipid Profile: No results for input(s): CHOL, HDL, LDLCALC, TRIG, CHOLHDL, LDLDIRECT in the last 72 hours. Thyroid Function Tests: No results  for input(s): TSH, T4TOTAL, FREET4, T3FREE, THYROIDAB in the last 72 hours. Anemia Panel: No results for input(s): VITAMINB12, FOLATE, FERRITIN, TIBC, IRON, RETICCTPCT in the last 72 hours. Urine analysis:    Component Value Date/Time   COLORURINE YELLOW 03/09/2021 1137   APPEARANCEUR CLEAR 03/09/2021 1137   LABSPEC 1.020 03/09/2021 1137   PHURINE 5.5 03/09/2021 1137   GLUCOSEU >=500 (A) 03/09/2021 1137   HGBUR SMALL (A) 03/09/2021 1137   BILIRUBINUR NEGATIVE 03/09/2021 1137   KETONESUR >80 (A) 03/09/2021 1137   PROTEINUR NEGATIVE 03/09/2021 1137   UROBILINOGEN 0.2 11/25/2013 2206   NITRITE NEGATIVE 03/09/2021 1137   LEUKOCYTESUR NEGATIVE 03/09/2021 1137    Radiological Exams on Admission:  No results found.  EKG: Independently reviewed.  Assessment/Plan Active Problems:   DKA (diabetic ketoacidosis) (HCC)    DKA - In context of DM1 missing insulin doses. Pt now does have access to NPH at home DKA pathway Q4H BMPs Insulin gtt per pathway (apparently can also tolerate human regular insulin (Myxredlin) in addition to NPH) IVF per pathway  DVT prophylaxis: Lovenox Code Status: Full Family Communication: No family in room Disposition Plan: Home after DKA resolved Consults called: None Admission status: Admit to inpatient  Severity of Illness: The appropriate patient status for this patient is INPATIENT. Inpatient status is judged to be reasonable and necessary in order to provide the required intensity of service to ensure the patient's safety. The patient's presenting symptoms, physical exam findings, and initial radiographic and laboratory data in the context of their chronic comorbidities is felt to place them at high risk for further clinical deterioration. Furthermore, it is not anticipated that the patient will be medically stable for discharge from the hospital within 2 midnights of admission. The following factors support the patient status of inpatient.   IP status  for management of DKA.   * I certify that at the point of admission it is my clinical judgment that the patient will require inpatient hospital care spanning beyond 2 midnights from the point of admission due to high intensity of service, high risk for further deterioration and high frequency of surveillance required.*   Herald Vallin M. DO [facility]  How to contact the [facility] Attending or Consulting provider 7A - 7P or covering provider during after hours 7P -7A, for this patient?  Check the care team in [facility] and look for a) attending/consulting [facility] provider listed and b) the [facility] team listed Log into [email or web address]  [employer] Physician Scheduling and messaging for groups and whole hospitals  On call and physician scheduling software for group practices, residents, hospitalists and other medical providers for call, clinic, rotation and shift schedules. On[employer] is a hospital-wide system for scheduling doctors and paging doctors on call. EasyPlot is for scientific plotting and data analysis.  [email or web address]  and use [facility] universal password to access. If you do not have the password, please contact the hospital operator.  Locate the [facility] provider you are looking for under [facility] and page to a number that you can be directly reached. If you still have difficulty reaching the provider, please page the [facility] (Director on Call) for the Hospitalists listed on amion for assistance.  03/09/2021, 7:58 PM

## 2021-03-10 LAB — GLUCOSE, CAPILLARY
Glucose-Capillary: 186 mg/dL — ABNORMAL HIGH (ref 70–99)
Glucose-Capillary: 212 mg/dL — ABNORMAL HIGH (ref 70–99)
Glucose-Capillary: 194 mg/dL — ABNORMAL HIGH (ref 70–99)
Glucose-Capillary: 175 mg/dL — ABNORMAL HIGH (ref 70–99)
Glucose-Capillary: 185 mg/dL — ABNORMAL HIGH (ref 70–99)
Glucose-Capillary: 220 mg/dL — ABNORMAL HIGH (ref 70–99)
Glucose-Capillary: 188 mg/dL — ABNORMAL HIGH (ref 70–99)
Glucose-Capillary: 187 mg/dL — ABNORMAL HIGH (ref 70–99)
Glucose-Capillary: 158 mg/dL — ABNORMAL HIGH (ref 70–99)
Glucose-Capillary: 204 mg/dL — ABNORMAL HIGH (ref 70–99)
Glucose-Capillary: 199 mg/dL — ABNORMAL HIGH (ref 70–99)
Glucose-Capillary: 138 mg/dL — ABNORMAL HIGH (ref 70–99)
Glucose-Capillary: 271 mg/dL — ABNORMAL HIGH (ref 70–99)

## 2021-03-10 LAB — BASIC METABOLIC PANEL
Anion gap: 9 (ref 5–15)
Anion gap: 10 (ref 5–15)
BUN: 13 mg/dL (ref 6–20)
BUN: 13 mg/dL (ref 6–20)
CO2: 15 mmol/L — ABNORMAL LOW (ref 22–32)
CO2: 16 mmol/L — ABNORMAL LOW (ref 22–32)
Calcium: 8.1 mg/dL — ABNORMAL LOW (ref 8.9–10.3)
Calcium: 8.2 mg/dL — ABNORMAL LOW (ref 8.9–10.3)
Chloride: 113 mmol/L — ABNORMAL HIGH (ref 98–111)
Chloride: 112 mmol/L — ABNORMAL HIGH (ref 98–111)
Creatinine, Ser: 0.91 mg/dL (ref 0.44–1.00)
Creatinine, Ser: 0.83 mg/dL (ref 0.44–1.00)
GFR, Estimated: >60 mL/min (ref >60)
GFR, Estimated: >60 mL/min (ref >60)
Glucose, Bld: 209 mg/dL — ABNORMAL HIGH (ref 70–99)
Glucose, Bld: 214 mg/dL — ABNORMAL HIGH (ref 70–99)
Potassium: 3.7 mmol/L (ref 3.5–5.1)
Potassium: 3.7 mmol/L (ref 3.5–5.1)
Sodium: 137 mmol/L (ref 135–145)
Sodium: 138 mmol/L (ref 135–145)

## 2021-03-10 LAB — HIV ANTIBODY (ROUTINE TESTING W REFLEX): HIV Screen 4th Generation wRfx: NONREACTIVE

## 2021-03-10 LAB — BETA-HYDROXYBUTYRIC ACID: Beta-Hydroxybutyric Acid: 5.04 mmol/L — ABNORMAL HIGH (ref 0.05–0.27)

## 2021-03-10 MED ORDER — INSULIN NPH (HUMAN) (ISOPHANE) 100 UNIT/ML ~~LOC~~ SUSP
30.0000 [IU] | Freq: Two times a day (BID) | SUBCUTANEOUS | Status: DC
  Administered 2021-03-10 (×2): 30 [IU] via SUBCUTANEOUS
  Filled 2021-03-10: qty 10

## 2021-03-10 MED ORDER — INSULIN ASPART 100 UNIT/ML IJ SOLN: 0.0000 [IU] | Freq: Every day | INTRAMUSCULAR | Status: DC

## 2021-03-10 MED ORDER — INSULIN ASPART 100 UNIT/ML IJ SOLN
0.0000 [IU] | Freq: Three times a day (TID) | INTRAMUSCULAR | Status: DC
  Administered 2021-03-10: 5 [IU] via SUBCUTANEOUS
  Administered 2021-03-10: 2 [IU] via SUBCUTANEOUS

## 2021-03-10 NOTE — Progress Notes (Signed)
PROGRESS NOTE    Beverly Wells  SAY:[id number] DOB: Aug 03, 1991 DOA: 03/09/2021 PCP: Renaye Rakers, MD    Brief Narrative:  Beverly Wells is a 30 year old female with past medical history significant for type 1 diabetes mellitus who presented to the ED on 7/25 with abdominal pain and associated vomiting.  Patient had been out of her insulin due to being in jail.  Patient was found to be in DKA with a blood sugar of 601, anion gap of 23 with a beta hydroxybutyrate acid greater than 8.00.  Urinalysis notable for greater than 80 ketones.  Influenza A/B PCR and COVID-19 PCR negative.  Patient was started on insulin drip.  TRH consulted for further evaluation management.   Assessment & Plan:   Active Problems:   DKA (diabetic ketoacidosis) (HCC)   Diabetic ketoacidosis Patient presenting from home with nausea and abdominal pain.  Found to have an elevated blood sugar of 601, anion gap 23 with beta hydroxybutyrate acid greater than 8.00 and urinalysis greater than 80 ketones.  Etiology likely secondary to medication noncompliance has been out of insulin while in jail.  Review of chart notable for history of poorly controlled diabetes with hemoglobin A1c on 01/06/2021 greater than 14.0.  Patient is not managed by a PCP and buys her insulin/NPH over-the-counter.  She also reports does not check her blood sugar regularly. --Transition insulin drip to NPH 30 units BID AC (reports on 40u BID at home) --Sensitive SSI for further coverage --CBG before every meal/at bedtime --Repeat hemoglobin A1c: Pending --Diabetic educator consult pending --TOC for medication assistance/PCP   DVT prophylaxis: enoxaparin (LOVENOX) injection 40 mg Start: 03/09/21 2200   Code Status: Full Code Family Communication: Family present at bedside  Disposition Plan:  Level of care: Stepdown Status is: Inpatient  Remains inpatient appropriate because:Unsafe d/c plan, IV treatments appropriate due to  intensity of illness or inability to take PO, and Inpatient level of care appropriate due to severity of illness  Dispo: The patient is from: Home              Anticipated d/c is to: Home              Patient currently is not medically stable to d/c.   Difficult to place patient No   Consultants:  None  Procedures:  None  Antimicrobials:  None   Subjective: Patient seen examined at bedside, resting comfortably.  Family present.  Abdominal pain and nausea have resolved.  Continues with poor appetite.  Anion gap is now closed x2.  No other complaints or concerns at this time.  Denies headache, no fever/chills/night sweats, no current nausea/vomiting, no diarrhea, no chest pain, no current abdominal pain, no palpitations, no weakness, no fatigue, no paresthesias.  No acute events overnight per nursing staff.  Plan to transition back to home NPH at slightly lower dose later this morning.  Pending diabetic educator consult.  Also will need PCP and likely medication assistance on discharge.  Objective: Vitals:   03/10/21 0900 03/10/21 0901 03/10/21 1000 03/10/21 1100  BP: (!) 85/50 (!) 97/51 (!) 96/45 (!) 94/48  Pulse: (!) 104 (!) 103 97 96  Resp: 18 16 15 17   Temp: 98.4 F (36.9 C)     TempSrc: Oral     SpO2: 100% 100% 100% 100%  Weight:      Height:        Intake/Output Summary (Last 24 hours) at 03/10/2021 1107 Last data filed at 03/10/2021 0400 Gross per  24 hour  Intake 3833.95 ml  Output 0 ml  Net 3833.95 ml   Filed Weights   03/09/21 1039 03/09/21 1905  Weight: 70.3 kg 71.8 kg    Examination:  General exam: Appears calm and comfortable  Respiratory system: Clear to auscultation. Respiratory effort normal. Cardiovascular system: S1 & S2 heard, RRR. No JVD, murmurs, rubs, gallops or clicks. No pedal edema. Gastrointestinal system: Abdomen is nondistended, soft and nontender. No organomegaly or masses felt. Normal bowel sounds heard. Central nervous system: Alert and  oriented. No focal neurological deficits. Extremities: Symmetric 5 x 5 power. Skin: No rashes, lesions or ulcers Psychiatry: Judgement and insight appear normal. Mood & affect appropriate.     Data Reviewed: I have personally reviewed following labs and imaging studies  CBC: Recent Labs  Lab 03/09/21 1105 03/09/21 1120  WBC 13.3*  --   HGB 11.3* 13.3  HCT 38.0 39.0  MCV 76.0*  --   PLT 356  --    Basic Metabolic Panel: Recent Labs  Lab 03/09/21 1217 03/09/21 1803 03/09/21 2159 03/10/21 0219 03/10/21 0644  NA 141 135 137 137 138  K 2.6* 4.4 4.3 3.7 3.7  CL 124* 107 108 113* 112*  CO2 <7* 12* 11* 15* 16*  GLUCOSE 331* 216* 211* 209* 214*  BUN 9 12 12 13 13   CREATININE 0.42* 0.85 0.87 0.91 0.83  CALCIUM 4.2* 7.8* 8.7* 8.1* 8.2*   GFR: Estimated Creatinine Clearance: 98.1 mL/min (by C-G formula based on SCr of 0.83 mg/dL). Liver Function Tests: Recent Labs  Lab 03/09/21 1105  AST 40  ALT 31  ALKPHOS 117  BILITOT 1.6*  PROT 8.1  ALBUMIN 3.7   Recent Labs  Lab 03/09/21 1105  LIPASE 20   No results for input(s): AMMONIA in the last 168 hours. Coagulation Profile: No results for input(s): INR, PROTIME in the last 168 hours. Cardiac Enzymes: No results for input(s): CKTOTAL, CKMB, CKMBINDEX, TROPONINI in the last 168 hours. BNP (last 3 results) No results for input(s): PROBNP in the last 8760 hours. HbA1C: No results for input(s): HGBA1C in the last 72 hours. CBG: Recent Labs  Lab 03/10/21 0646 03/10/21 0751 03/10/21 0853 03/10/21 0953 03/10/21 1053  GLUCAP 220* 188* 187* 158* 204*   Lipid Profile: No results for input(s): CHOL, HDL, LDLCALC, TRIG, CHOLHDL, LDLDIRECT in the last 72 hours. Thyroid Function Tests: No results for input(s): TSH, T4TOTAL, FREET4, T3FREE, THYROIDAB in the last 72 hours. Anemia Panel: No results for input(s): VITAMINB12, FOLATE, FERRITIN, TIBC, IRON, RETICCTPCT in the last 72 hours. Sepsis Labs: No results for  input(s): PROCALCITON, LATICACIDVEN in the last 168 hours.  Recent Results (from the past 240 hour(s))  Resp Panel by RT-PCR (Flu A&B, Covid) Urine, Clean Catch     Status: None   Collection Time: 03/09/21 11:05 AM   Specimen: Urine, Clean Catch; Nasopharyngeal(NP) swabs in vial transport medium  Result Value Ref Range Status   SARS Coronavirus 2 by RT PCR NEGATIVE NEGATIVE Final    Comment: (NOTE) SARS-CoV-2 target nucleic acids are NOT DETECTED.  The SARS-CoV-2 RNA is generally detectable in upper respiratory specimens during the acute phase of infection. The lowest concentration of SARS-CoV-2 viral copies this assay can detect is 138 copies/mL. A negative result does not preclude SARS-Cov-2 infection and should not be used as the sole basis for treatment or other patient management decisions. A negative result may occur with  improper specimen collection/handling, submission of specimen other than nasopharyngeal swab, presence of viral  mutation(s) within the areas targeted by this assay, and inadequate number of viral copies(<138 copies/mL). A negative result must be combined with clinical observations, patient history, and epidemiological information. The expected result is Negative.  Fact Sheet for Patients:  BloggerCourse.com  Fact Sheet for Healthcare Providers:  SeriousBroker.it  This test is no t yet approved or cleared by the [place] FDA and  has been authorized for detection and/or diagnosis of SARS-CoV-2 by FDA under an Emergency Use Authorization (EUA). This EUA will remain  in effect (meaning this test can be used) for the duration of the COVID-19 declaration under Section 564(b)(1) of the Act, 21 U.S.C.section 360bbb-3(b)(1), unless the authorization is terminated  or revoked sooner.       Influenza A by PCR NEGATIVE NEGATIVE Final   Influenza B by PCR NEGATIVE NEGATIVE Final    Comment: (NOTE) The Xpert  Xpress SARS-CoV-2/FLU/RSV plus assay is intended as an aid in the diagnosis of influenza from Nasopharyngeal swab specimens and should not be used as a sole basis for treatment. Nasal washings and aspirates are unacceptable for Xpert Xpress SARS-CoV-2/FLU/RSV testing.  Fact Sheet for Patients: BloggerCourse.com  Fact Sheet for Healthcare Providers: SeriousBroker.it  This test is not yet approved or cleared by the [place] FDA and has been authorized for detection and/or diagnosis of SARS-CoV-2 by FDA under an Emergency Use Authorization (EUA). This EUA will remain in effect (meaning this test can be used) for the duration of the COVID-19 declaration under Section 564(b)(1) of the Act, 21 U.S.C. section 360bbb-3(b)(1), unless the authorization is terminated or revoked.  Performed at [facility], [address] [place], [place] [address]   MRSA Next Gen by PCR, Nasal     Status: None   Collection Time: 03/09/21  9:30 PM   Specimen: Nasal Mucosa; Nasal Swab  Result Value Ref Range Status   MRSA by PCR Next Gen NOT DETECTED NOT DETECTED Final    Comment: (NOTE) The GeneXpert MRSA Assay (FDA approved for NASAL specimens only), is one component of a comprehensive MRSA colonization surveillance program. It is not intended to diagnose MRSA infection nor to guide or monitor treatment for MRSA infections. Test performance is not FDA approved in patients less than 51 years old. Performed at [facility], [address], [place], [place] [address]          Radiology Studies: No results found.      Scheduled Meds:  Chlorhexidine Gluconate Cloth  6 each Topical Daily   enoxaparin (LOVENOX) injection  40 mg Subcutaneous Q24H   insulin aspart  0-5 Units Subcutaneous QHS   insulin aspart  0-9 Units Subcutaneous TID WC   insulin NPH Human  30 Units Subcutaneous BID AC   Continuous Infusions:   dextrose 5% lactated ringers 125 mL/hr at 03/10/21 0519   insulin 2.2 Units/hr (03/10/21 0400)   lactated ringers 125 mL/hr at 03/09/21 2000     LOS: 1 day    Time spent: 39 minutes spent on chart review, discussion with nursing staff, consultants, updating family and interview/physical exam; more than 50% of that time was spent in counseling and/or coordination of care.    Alvira Philips [place], DO [facility] Available via Epic secure chat 7am-7pm After these hours, please refer to coverage provider listed on [email or web address] 03/10/2021, 11:07 AM
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
## 2021-03-10 NOTE — Progress Notes (Signed)
Inpatient Diabetes Program Recommendations  AACE/ADA: New Consensus Statement on Inpatient Glycemic Control (2015)  Target Ranges:  Prepandial:   less than 140 mg/dL      Peak postprandial:   less than 180 mg/dL (1-2 hours)      Critically ill patients:  140 - 180 mg/dL   Lab Results  Component Value Date   GLUCAP 138 (H) 03/10/2021   HGBA1C 11.0 (H) 06/08/2012    Review of Glycemic Control  Diabetes history: DM1 Outpatient Diabetes medications: 70/30 45 units BID Current orders for Inpatient glycemic control: Transitioned off IV insulin drip to Novolin N 30 units BID, Novolog 0-9 units TID with meals and 0-5 HS  Started CHO mod diet today at lunch. Will need meal coverage insulin  Inpatient Diabetes Program Recommendations:    Add Novolog 4 units TID with meals when eating > 50%.  Spoke with pt at bedside regarding her diabetes regimen at home. Pt states she went over 24H without insulin d/t being in jail. States she checks blood sugars several times/day and takes her insulin at breakfast and dinner. Gets insulin from [facility] Novolin 70/30, uses vial/syringe.   Continue to monitor glucose trends.   Thank you. Ailene Ards, RD, LDN, CDE Inpatient Diabetes Coordinator [phone]

## 2021-03-10 NOTE — TOC Initial Note (Signed)
Transition of Care [facility]) - Initial/Assessment Note    Patient Details  Name: Beverly Wells MRN: [id number] Date of Birth: 06-Sep-1990  Transition of Care [facility] CM/SW Contact:    Golda Acre, RN Phone Number: 03/10/2021, 8:37 AM  Clinical Narrative:                 30 y.o. female with medical history significant of DM1.  Pt presents to ED at [facility] with c/o 3 day h/o uncontrolled hyperglycemia.  This occurs in the context of having not had insulin while in jail per patient.  After getting out of jail she tried to play "catch up" but has been unsuccessful.   Pt normally takes only NPH as she is allergic to all other insulins (except regular as well it seems today).   Associated abd pain, cramping, vomiting, nausea.   Does have monthly abd pain, N/V associated with menstruation as well. TOC PLAN OF CARE: Insulin protocol, will need to be seen by the diabetic coordinator.  Bld. Glucose 214, a.g. closed , bha-5.04. Expected Discharge Plan: Home/Self Care Barriers to Discharge: Continued Medical Work up   Patient Goals and CMS Choice Patient states their goals for this hospitalization and ongoing recovery are:: to go home CMS Medicare.gov Compare Post Acute Care list provided to:: Patient    Expected Discharge Plan and Services Expected Discharge Plan: Home/Self Care   Discharge Planning Services: CM Consult   Living arrangements for the past 2 months: Single Family Home                                      Prior Living Arrangements/Services Living arrangements for the past 2 months: Single Family Home Lives with:: Self Patient language and need for interpreter reviewed:: Yes Do you feel safe going back to the place where you live?: Yes            Criminal Activity/Legal Involvement Pertinent to Current Situation/Hospitalization: No - Comment as needed  Activities of Daily Living      Permission Sought/Granted                  Emotional  Assessment Appearance:: Appears stated age Attitude/Demeanor/Rapport: Engaged Affect (typically observed): Calm Orientation: : Oriented to Place, Oriented to Self, Oriented to  Time, Oriented to Situation Alcohol / Substance Use: Not Applicable Psych Involvement: No (comment)  Admission diagnosis:  Dehydration [E86.0] DKA (diabetic ketoacidosis) (HCC) [E11.10] Hyperglycemia [R73.9] Diabetic ketoacidosis without coma associated with other specified diabetes mellitus (HCC) [E13.10] Patient Active Problem List   Diagnosis Date Noted   DKA (diabetic ketoacidosis) (HCC) 03/09/2021   Anemia 09/07/2013   Intractable vomiting 06/07/2012   Lung nodule seen on imaging study 06/07/2012   Dental abscess 01/19/2012   DKA (diabetic ketoacidoses) 10/26/2011   URI (upper respiratory infection) 10/26/2011   ALLERGIC RHINITIS 05/07/2010   IDDM 08/05/2009   PCP:  Renaye Rakers, MD Pharmacy:   [facility] [address] [place] - [address] [place] [address] Phone: [phone] Fax: [phone]     Social Determinants of Health (SDOH) Interventions    Readmission Risk Interventions No flowsheet data found.

## 2021-03-10 NOTE — Progress Notes (Signed)
Patient decided to leave AMA. Explained to patient the dangers of leaving AMA and patient verbalized understanding. Patient alert and oriented x4. [place] MD made aware of patient wanting to leave AMA. Patient filled out AMA form @1732 . Ivs removed. Patient walked out on their own free will. All belongings with patient confirmed by patient.

## 2021-03-11 LAB — HEMOGLOBIN A1C
Hgb A1c MFr Bld: >15.5 % — ABNORMAL HIGH (ref 4.8–5.6)
Mean Plasma Glucose: >398 mg/dL

## 2021-03-13 ENCOUNTER — Encounter (HOSPITAL_BASED_OUTPATIENT_CLINIC_OR_DEPARTMENT_OTHER): Payer: Self-pay | Admitting: *Deleted

## 2021-03-13 ENCOUNTER — Emergency Department (HOSPITAL_BASED_OUTPATIENT_CLINIC_OR_DEPARTMENT_OTHER): Payer: Self-pay

## 2021-03-13 ENCOUNTER — Other Ambulatory Visit: Payer: Self-pay

## 2021-03-13 ENCOUNTER — Observation Stay (HOSPITAL_BASED_OUTPATIENT_CLINIC_OR_DEPARTMENT_OTHER)
Admission: EM | Admit: 2021-03-13 | Discharge: 2021-03-14 | Disposition: A | Discharge status: Home / Self Care | Payer: Self-pay | Attending: Internal Medicine | Admitting: Internal Medicine

## 2021-03-13 DIAGNOSIS — Z91010 Allergy to peanuts: Secondary | ICD-10-CM | POA: Insufficient documentation

## 2021-03-13 DIAGNOSIS — Z794 Long term (current) use of insulin: Secondary | ICD-10-CM | POA: Insufficient documentation

## 2021-03-13 DIAGNOSIS — D509 Iron deficiency anemia, unspecified: Secondary | ICD-10-CM | POA: Insufficient documentation

## 2021-03-13 DIAGNOSIS — E1010 Type 1 diabetes mellitus with ketoacidosis without coma: Principal | ICD-10-CM | POA: Diagnosis present

## 2021-03-13 DIAGNOSIS — E1110 Type 2 diabetes mellitus with ketoacidosis without coma: Secondary | ICD-10-CM | POA: Diagnosis present

## 2021-03-13 DIAGNOSIS — F17210 Nicotine dependence, cigarettes, uncomplicated: Secondary | ICD-10-CM | POA: Insufficient documentation

## 2021-03-13 DIAGNOSIS — Z20822 Contact with and (suspected) exposure to covid-19: Secondary | ICD-10-CM | POA: Insufficient documentation

## 2021-03-13 LAB — I-STAT VENOUS BLOOD GAS, ED
Acid-base deficit: 14 mmol/L — ABNORMAL HIGH (ref 0.0–2.0)
Bicarbonate: 12.4 mmol/L — ABNORMAL LOW (ref 20.0–28.0)
Calcium, Ion: 1.23 mmol/L (ref 1.15–1.40)
HCT: 35 % — ABNORMAL LOW (ref 36.0–46.0)
Hemoglobin: 11.9 g/dL — ABNORMAL LOW (ref 12.0–15.0)
O2 Saturation: 56 %
Patient temperature: 97.8
Potassium: 4.3 mmol/L (ref 3.5–5.1)
Sodium: 134 mmol/L — ABNORMAL LOW (ref 135–145)
TCO2: 13 mmol/L — ABNORMAL LOW (ref 22–32)
pCO2, Ven: 30.4 mmHg — ABNORMAL LOW (ref 44.0–60.0)
pH, Ven: 7.216 — ABNORMAL LOW (ref 7.250–7.430)
pO2, Ven: 34 mmHg (ref 32.0–45.0)

## 2021-03-13 LAB — BASIC METABOLIC PANEL
Anion gap: 19 — ABNORMAL HIGH (ref 5–15)
Anion gap: 13 (ref 5–15)
BUN: 14 mg/dL (ref 6–20)
BUN: 12 mg/dL (ref 6–20)
CO2: 10 mmol/L — ABNORMAL LOW (ref 22–32)
CO2: 12 mmol/L — ABNORMAL LOW (ref 22–32)
Calcium: 8.6 mg/dL — ABNORMAL LOW (ref 8.9–10.3)
Calcium: 8.6 mg/dL — ABNORMAL LOW (ref 8.9–10.3)
Chloride: 109 mmol/L (ref 98–111)
Chloride: 115 mmol/L — ABNORMAL HIGH (ref 98–111)
Creatinine, Ser: 1 mg/dL (ref 0.44–1.00)
Creatinine, Ser: 0.85 mg/dL (ref 0.44–1.00)
GFR, Estimated: >60 mL/min (ref >60)
GFR, Estimated: >60 mL/min (ref >60)
Glucose, Bld: 232 mg/dL — ABNORMAL HIGH (ref 70–99)
Glucose, Bld: 171 mg/dL — ABNORMAL HIGH (ref 70–99)
Potassium: 3.9 mmol/L (ref 3.5–5.1)
Potassium: 5 mmol/L (ref 3.5–5.1)
Sodium: 138 mmol/L (ref 135–145)
Sodium: 140 mmol/L (ref 135–145)

## 2021-03-13 LAB — CBG MONITORING, ED
Glucose-Capillary: 452 mg/dL — ABNORMAL HIGH (ref 70–99)
Glucose-Capillary: 476 mg/dL — ABNORMAL HIGH (ref 70–99)
Glucose-Capillary: 451 mg/dL — ABNORMAL HIGH (ref 70–99)
Glucose-Capillary: 539 mg/dL (ref 70–99)
Glucose-Capillary: 423 mg/dL — ABNORMAL HIGH (ref 70–99)
Glucose-Capillary: 340 mg/dL — ABNORMAL HIGH (ref 70–99)

## 2021-03-13 LAB — RESP PANEL BY RT-PCR (FLU A&B, COVID) ARPGX2
Influenza A by PCR: NEGATIVE
Influenza B by PCR: NEGATIVE
SARS Coronavirus 2 by RT PCR: NEGATIVE

## 2021-03-13 LAB — MRSA NEXT GEN BY PCR, NASAL: MRSA by PCR Next Gen: NOT DETECTED

## 2021-03-13 LAB — CBC WITH DIFFERENTIAL/PLATELET
Abs Immature Granulocytes: 0.03 10*3/uL (ref 0.00–0.07)
Basophils Absolute: 0 10*3/uL (ref 0.0–0.1)
Basophils Relative: 0 %
Eosinophils Absolute: 0 10*3/uL (ref 0.0–0.5)
Eosinophils Relative: 1 %
HCT: 35.4 % — ABNORMAL LOW (ref 36.0–46.0)
Hemoglobin: 10.5 g/dL — ABNORMAL LOW (ref 12.0–15.0)
Immature Granulocytes: 0 %
Lymphocytes Relative: 15 %
Lymphs Abs: 1.1 10*3/uL (ref 0.7–4.0)
MCH: 22.5 pg — ABNORMAL LOW (ref 26.0–34.0)
MCHC: 29.7 g/dL — ABNORMAL LOW (ref 30.0–36.0)
MCV: 75.8 fL — ABNORMAL LOW (ref 80.0–100.0)
Monocytes Absolute: 0.3 10*3/uL (ref 0.1–1.0)
Monocytes Relative: 4 %
Neutro Abs: 6.3 10*3/uL (ref 1.7–7.7)
Neutrophils Relative %: 80 %
Platelets: 309 10*3/uL (ref 150–400)
RBC: 4.67 MIL/uL (ref 3.87–5.11)
RDW: 17 % — ABNORMAL HIGH (ref 11.5–15.5)
WBC: 7.8 10*3/uL (ref 4.0–10.5)
nRBC: 0 % (ref 0.0–0.2)

## 2021-03-13 LAB — URINALYSIS, ROUTINE W REFLEX MICROSCOPIC
Bilirubin Urine: NEGATIVE
Glucose, UA: >1000 mg/dL — AB
Ketones, ur: >80 mg/dL — AB
Leukocytes,Ua: NEGATIVE
Nitrite: NEGATIVE
Specific Gravity, Urine: 1.027 (ref 1.005–1.030)
pH: 5.5 (ref 5.0–8.0)

## 2021-03-13 LAB — COMPREHENSIVE METABOLIC PANEL
ALT: 30 U/L (ref 0–44)
AST: 29 U/L (ref 15–41)
Albumin: 3.9 g/dL (ref 3.5–5.0)
Alkaline Phosphatase: 95 U/L (ref 38–126)
Anion gap: 26 — ABNORMAL HIGH (ref 5–15)
BUN: 16 mg/dL (ref 6–20)
CO2: 10 mmol/L — ABNORMAL LOW (ref 22–32)
Calcium: 8.8 mg/dL — ABNORMAL LOW (ref 8.9–10.3)
Chloride: 99 mmol/L (ref 98–111)
Creatinine, Ser: 0.9 mg/dL (ref 0.44–1.00)
GFR, Estimated: >60 mL/min (ref >60)
Glucose, Bld: 549 mg/dL (ref 70–99)
Potassium: 4.3 mmol/L (ref 3.5–5.1)
Sodium: 135 mmol/L (ref 135–145)
Total Bilirubin: 0.5 mg/dL (ref 0.3–1.2)
Total Protein: 7.2 g/dL (ref 6.5–8.1)

## 2021-03-13 LAB — GLUCOSE, CAPILLARY
Glucose-Capillary: 133 mg/dL — ABNORMAL HIGH (ref 70–99)
Glucose-Capillary: 146 mg/dL — ABNORMAL HIGH (ref 70–99)
Glucose-Capillary: 150 mg/dL — ABNORMAL HIGH (ref 70–99)
Glucose-Capillary: 168 mg/dL — ABNORMAL HIGH (ref 70–99)
Glucose-Capillary: 168 mg/dL — ABNORMAL HIGH (ref 70–99)
Glucose-Capillary: 170 mg/dL — ABNORMAL HIGH (ref 70–99)
Glucose-Capillary: 190 mg/dL — ABNORMAL HIGH (ref 70–99)
Glucose-Capillary: 191 mg/dL — ABNORMAL HIGH (ref 70–99)
Glucose-Capillary: 247 mg/dL — ABNORMAL HIGH (ref 70–99)
Glucose-Capillary: 336 mg/dL — ABNORMAL HIGH (ref 70–99)

## 2021-03-13 LAB — IRON AND TIBC
Iron: 15 ug/dL — ABNORMAL LOW (ref 28–170)
Saturation Ratios: 4 % — ABNORMAL LOW (ref 10.4–31.8)
TIBC: 406 ug/dL (ref 250–450)
UIBC: 391 ug/dL

## 2021-03-13 LAB — CBC
HCT: 34.8 % — ABNORMAL LOW (ref 36.0–46.0)
Hemoglobin: 10.2 g/dL — ABNORMAL LOW (ref 12.0–15.0)
MCH: 22.7 pg — ABNORMAL LOW (ref 26.0–34.0)
MCHC: 29.3 g/dL — ABNORMAL LOW (ref 30.0–36.0)
MCV: 77.3 fL — ABNORMAL LOW (ref 80.0–100.0)
Platelets: 288 10*3/uL (ref 150–400)
RBC: 4.5 MIL/uL (ref 3.87–5.11)
RDW: 17.2 % — ABNORMAL HIGH (ref 11.5–15.5)
WBC: 9.5 10*3/uL (ref 4.0–10.5)
nRBC: 0 % (ref 0.0–0.2)

## 2021-03-13 LAB — RETICULOCYTES
Immature Retic Fract: 27.7 % — ABNORMAL HIGH (ref 2.3–15.9)
RBC.: 4.56 MIL/uL (ref 3.87–5.11)
Retic Count, Absolute: 71.6 10*3/uL (ref 19.0–186.0)
Retic Ct Pct: 1.6 % (ref 0.4–3.1)

## 2021-03-13 LAB — BETA-HYDROXYBUTYRIC ACID
Beta-Hydroxybutyric Acid: >8 mmol/L — ABNORMAL HIGH (ref 0.05–0.27)
Beta-Hydroxybutyric Acid: 6.69 mmol/L — ABNORMAL HIGH (ref 0.05–0.27)

## 2021-03-13 LAB — FOLATE: Folate: 17.5 ng/mL (ref >5.9)

## 2021-03-13 LAB — FERRITIN: Ferritin: 14 ng/mL (ref 11–307)

## 2021-03-13 LAB — PREGNANCY, URINE: Preg Test, Ur: NEGATIVE

## 2021-03-13 LAB — VITAMIN B12: Vitamin B-12: 1409 pg/mL — ABNORMAL HIGH (ref 180–914)

## 2021-03-13 MED ORDER — DEXTROSE 50 % IV SOLN: 0.0000 mL | INTRAVENOUS | Status: DC | PRN

## 2021-03-13 MED ORDER — CHLORHEXIDINE GLUCONATE 0.12 % MT SOLN
15.0000 mL | Freq: Two times a day (BID) | OROMUCOSAL | Status: DC
  Administered 2021-03-13: 15 mL via OROMUCOSAL
  Filled 2021-03-13: qty 15

## 2021-03-13 MED ORDER — LACTATED RINGERS IV SOLN: INTRAVENOUS | Status: DC

## 2021-03-13 MED ORDER — INSULIN REGULAR(HUMAN) IN NACL 100-0.9 UT/100ML-% IV SOLN
INTRAVENOUS | Status: DC
  Administered 2021-03-13: 2.8 [IU]/h via INTRAVENOUS

## 2021-03-13 MED ORDER — ORAL CARE MOUTH RINSE
15.0000 mL | Freq: Two times a day (BID) | OROMUCOSAL | Status: DC
  Administered 2021-03-14 (×2): 15 mL via OROMUCOSAL

## 2021-03-13 MED ORDER — DEXTROSE IN LACTATED RINGERS 5 % IV SOLN: INTRAVENOUS | Status: DC

## 2021-03-13 MED ORDER — ONDANSETRON HCL 4 MG/2ML IJ SOLN
4.0000 mg | Freq: Once | INTRAMUSCULAR | Status: AC
  Administered 2021-03-13: 4 mg via INTRAVENOUS
  Filled 2021-03-13: qty 2

## 2021-03-13 MED ORDER — ENOXAPARIN SODIUM 40 MG/0.4ML IJ SOSY
40.0000 mg | PREFILLED_SYRINGE | INTRAMUSCULAR | Status: DC
  Administered 2021-03-13: 40 mg via SUBCUTANEOUS
  Filled 2021-03-13: qty 0.4

## 2021-03-13 MED ORDER — PROMETHAZINE HCL 25 MG/ML IJ SOLN
25.0000 mg | Freq: Four times a day (QID) | INTRAMUSCULAR | Status: DC | PRN
  Administered 2021-03-13: 25 mg via INTRAMUSCULAR
  Filled 2021-03-13: qty 1

## 2021-03-13 MED ORDER — INSULIN REGULAR(HUMAN) IN NACL 100-0.9 UT/100ML-% IV SOLN
INTRAVENOUS | Status: DC
  Administered 2021-03-13: 6.5 [IU]/h via INTRAVENOUS
  Filled 2021-03-13: qty 100

## 2021-03-13 MED ORDER — LACTATED RINGERS IV BOLUS
20.0000 mL/kg | Freq: Once | INTRAVENOUS | Status: AC
  Administered 2021-03-13: 1436 mL via INTRAVENOUS

## 2021-03-13 MED ORDER — POTASSIUM CHLORIDE 10 MEQ/100ML IV SOLN
10.0000 meq | INTRAVENOUS | Status: AC
  Administered 2021-03-13: 10 meq via INTRAVENOUS
  Filled 2021-03-13: qty 100

## 2021-03-13 MED ORDER — CHLORHEXIDINE GLUCONATE CLOTH 2 % EX PADS
6.0000 | MEDICATED_PAD | Freq: Every day | CUTANEOUS | Status: DC
  Administered 2021-03-13 – 2021-03-14 (×2): 6 via TOPICAL

## 2021-03-13 NOTE — ED Provider Notes (Signed)
[facility] [place] [facility] EMERGENCY DEPT Provider Note   CSN: [id number] Arrival date & time: 03/13/21  0630     History Chief Complaint  Patient presents with   Diabetes    Beverly Wells is a 30 y.o. female.  On March 11, 2021, the patient was admitted for DKA.  She states that she started to feel better, and wanted to leave.  She has had type 1 diabetes for 18 years, and she stated that she needed to go take care of her son.  She is also dealing with legal issues and has recently been released from jail.  She states that she really just does not have time to be in the hospital.  However, she does state that she is willing to be admitted today if necessary.  Unfortunately, she did not pick up her medication from the pharmacy, and she has not had insulin since her leaving the hospital.  She does not have health insurance but is able to afford it over-the-counter at [facility].  She has an endocrinologist at [facility], and she does state that she will be able to follow-up with them.  The history is provided by the patient.  Diabetes This is a chronic problem. Episode onset: 3 days ago. The problem occurs constantly. The problem has been gradually worsening. Pertinent negatives include no chest pain, no abdominal pain, no headaches and no shortness of breath. Nothing aggravates the symptoms. Nothing relieves the symptoms. She has tried nothing for the symptoms. The treatment provided no relief.      Past Medical History:  Diagnosis Date   ALLERGIC RHINITIS 05/07/2010   Diabetes mellitus without complication (HCC)    DKA (diabetic ketoacidoses)    IDDM 08/05/2009   PERS HX NONCOMPLIANCE W/MED TX PRS HAZARDS HLTH 08/24/2010    Patient Active Problem List   Diagnosis Date Noted   DKA (diabetic ketoacidosis) (HCC) 03/09/2021   Anemia 09/07/2013   Intractable vomiting 06/07/2012   Lung nodule seen on imaging study 06/07/2012   Dental abscess 01/19/2012   DKA (diabetic ketoacidoses)  10/26/2011   URI (upper respiratory infection) 10/26/2011   ALLERGIC RHINITIS 05/07/2010   IDDM 08/05/2009    Past Surgical History:  Procedure Laterality Date   TONSILLECTOMY       OB History     Gravida  0   Para  0   Term  0   Preterm  0   AB  0   Living  0      SAB  0   IAB  0   Ectopic  0   Multiple  0   Live Births  0           Family History  Problem Relation Age of Onset   Other Mother    Other Father    Diabetes Neg Hx     Social History   Tobacco Use   Smoking status: Every Day    Types: E-cigarettes   Smokeless tobacco: Never  Substance Use Topics   Alcohol use: Yes    Comment: occasional    Drug use: No    Home Medications Prior to Admission medications   Medication Sig Start Date End Date Taking? Authorizing Provider  cetirizine (ZYRTEC) 10 MG tablet Take 10 mg by mouth at bedtime.    [provider]  Ferrous Sulfate (IRON PO) Take 1 tablet by mouth daily.    [provider]  ibuprofen (ADVIL) 600 MG tablet Take 1 tablet (600 mg total) by  mouth every 6 (six) hours as needed for moderate pain. Patient not taking: No sig reported 02/17/21   Fayrene Helper, PA-C  insulin NPH-regular Human (NOVOLIN 70/30 RELION) (70-30) 100 UNIT/ML injection Inject 35 Units into the skin 2 (two) times daily with a meal.    [provider]    Allergies    Peanut butter flavor, Penicillins, Banana, Orange concentrate [flavoring agent], and Other  Review of Systems   Review of Systems  Constitutional:  Negative for chills and fever.  HENT:  Negative for ear pain and sore throat.   Eyes:  Negative for pain and visual disturbance.  Respiratory:  Negative for cough and shortness of breath.   Cardiovascular:  Negative for chest pain and palpitations.  Gastrointestinal:  Positive for nausea and vomiting. Negative for abdominal pain.  Genitourinary:  Negative for dysuria and hematuria.  Musculoskeletal:  Negative for arthralgias  and back pain.  Skin:  Negative for color change and rash.  Neurological:  Negative for seizures, syncope and headaches.  All other systems reviewed and are negative.  Physical Exam Updated Vital Signs BP (!) 141/72 (BP Location: Right Arm)   Pulse 92   Temp 97.9 F (36.6 C) (Oral)   Resp (!) 38   Ht 5\' 7"  (1.702 m)   Wt 71.8 kg   LMP 03/10/2021   SpO2 100%   BMI 24.79 kg/m   Physical Exam Vitals and nursing note reviewed.  HENT:     Head: Normocephalic and atraumatic.     Nose: Nose normal.     Mouth/Throat:     Mouth: Mucous membranes are dry.  Eyes:     General: No scleral icterus.    Conjunctiva/sclera: Conjunctivae normal.  Cardiovascular:     Rate and Rhythm: Normal rate and regular rhythm.  Pulmonary:     Effort: Pulmonary effort is normal. No respiratory distress.  Musculoskeletal:        General: No deformity or signs of injury.     Cervical back: Normal range of motion.  Skin:    General: Skin is warm and dry.  Neurological:     General: No focal deficit present.     Mental Status: She is alert and oriented to person, place, and time.  Psychiatric:        Mood and Affect: Mood normal.    ED Results / Procedures / Treatments   Labs (all labs ordered are listed, but only abnormal results are displayed) Labs Reviewed  CBC WITH DIFFERENTIAL/PLATELET - Abnormal; Notable for the following components:      Result Value   Hemoglobin 10.5 (*)    HCT 35.4 (*)    MCV 75.8 (*)    MCH 22.5 (*)    MCHC 29.7 (*)    RDW 17.0 (*)    All other components within normal limits  COMPREHENSIVE METABOLIC PANEL - Abnormal; Notable for the following components:   CO2 10 (*)    Glucose, Bld 549 (*)    Calcium 8.8 (*)    Anion gap 26 (*)    All other components within normal limits  CBG MONITORING, ED - Abnormal; Notable for the following components:   Glucose-Capillary 452 (*)    All other components within normal limits  I-STAT VENOUS BLOOD GAS, ED - Abnormal;  Notable for the following components:   pH, Ven 7.216 (*)    pCO2, Ven 30.4 (*)    Bicarbonate 12.4 (*)    TCO2 13 (*)    Acid-base  deficit 14.0 (*)    Sodium 134 (*)    HCT 35.0 (*)    Hemoglobin 11.9 (*)    All other components within normal limits  RESP PANEL BY RT-PCR (FLU A&B, COVID) ARPGX2  PREGNANCY, URINE  BETA-HYDROXYBUTYRIC ACID  BETA-HYDROXYBUTYRIC ACID  URINALYSIS, ROUTINE W REFLEX MICROSCOPIC    EKG None  Radiology No results found.  Procedures Ultrasound ED Peripheral IV (Provider)  Date/Time: 03/13/2021 9:22 AM Performed by: Koleen Distance, MD Authorized by: Koleen Distance, MD   Procedure details:    Indications: multiple failed IV attempts and poor IV access     Skin Prep: isopropyl alcohol     Location:  Right AC   Angiocath:  20 G   Bedside Ultrasound Guided: Yes     Images: not archived     Patient tolerated procedure without complications: Yes     Dressing applied: Yes   .Critical Care  Date/Time: 03/13/2021 10:38 AM Performed by: Koleen Distance, MD Authorized by: Koleen Distance, MD   Critical care provider statement:    Critical care time (minutes):  45   Critical care time was exclusive of:  Teaching time and separately billable procedures and treating other patients   Critical care was necessary to treat or prevent imminent or life-threatening deterioration of the following conditions:  Endocrine crisis and dehydration   Critical care was time spent personally by me on the following activities:  Blood draw for specimens, development of treatment plan with patient or surrogate, discussions with consultants, evaluation of patient's response to treatment, examination of patient, obtaining history from patient or surrogate, ordering and performing treatments and interventions, ordering and review of laboratory studies, ordering and review of radiographic studies, pulse oximetry, re-evaluation of patient's condition and review of old charts   I  assumed direction of critical care for this patient from another provider in my specialty: no     Care discussed with: admitting provider     Medications Ordered in ED Medications  ondansetron (ZOFRAN) injection 4 mg (has no administration in time range)  promethazine (PHENERGAN) injection 25 mg (25 mg Intramuscular Given 03/13/21 0853)  insulin regular, human (MYXREDLIN) 100 units/ 100 mL infusion (has no administration in time range)  lactated ringers infusion (has no administration in time range)  dextrose 5 % in lactated ringers infusion (has no administration in time range)  dextrose 50 % solution 0-50 mL (has no administration in time range)  potassium chloride 10 mEq in 100 mL IVPB (has no administration in time range)  lactated ringers bolus 1,436 mL (1,436 mLs Intravenous New Bag/Given 03/13/21 6967)    ED Course  I have reviewed the triage vital signs and the nursing notes.  Pertinent labs & imaging results that were available during my care of the patient were reviewed by me and considered in my medical decision making (see chart for details).  Clinical Course as of 03/13/21 1037  Fri Mar 13, 2021  8938 I spoke with Dr. Toniann Fail regarding admission. [AW]    Clinical Course User Index [AW] Koleen Distance, MD   MDM Rules/Calculators/A&P                           Dimple Casey ended in DKA after leaving the hospital AMA yesterday.  She was given hydration and started on an insulin infusion.  She will be admitted for ongoing management. Reason for DKA is likely recent noncompliance with  insulin due to being incarcerated.  No other indication that there is another underlying illness at this point. Final Clinical Impression(s) / ED Diagnoses Final diagnoses:  Diabetic ketoacidosis without coma associated with type 1 diabetes mellitus (HCC)    Rx / DC Orders ED Discharge Orders     None        Koleen DistanceWright, Jony Ladnier G, MD 03/13/21 1039

## 2021-03-13 NOTE — ED Notes (Signed)
Handoff report given to carelink 

## 2021-03-13 NOTE — H&P (Signed)
History and Physical    Beverly Wells NWG:[id number] DOB: 08/22/1990 DOA: 03/13/2021  PCP: Renaye RakersBland, Veita, MD  Patient coming from: Home.  Chief Complaint: Nausea and increasing respiration.  HPI: Beverly Wells is a 30 y.o. female with history of diabetes mellitus type 1 was recently admitted to the hospital for DKA and signed out AGAINST MEDICAL ADVICE on March 10, 2021 presents to the ER with complaints of increasing respirations and nausea.  Patient states she did take a medication to me.  Denies any chest pain or shortness of breath abdominal pain or diarrhea.  ED Course: In the ER patient blood sugar was 549 with anion gap of 26 bicarb of 10 and hemoglobin was 10.  Patient's beta hydroxybutyric acid was more than 8.  Patient was started on insulin infusion and fluid bolus and fluid and patient admitted for diabetic ketoacidosis.  COVID test was negative.  Review of Systems: As per HPI, rest all negative.   Past Medical History:  Diagnosis Date   ALLERGIC RHINITIS 05/07/2010   Diabetes mellitus without complication (HCC)    DKA (diabetic ketoacidoses)    IDDM 08/05/2009   PERS HX NONCOMPLIANCE W/MED TX PRS HAZARDS HLTH 08/24/2010    Past Surgical History:  Procedure Laterality Date   TONSILLECTOMY       reports that she has been smoking e-cigarettes. She has never used smokeless tobacco. She reports current alcohol use. She reports that she does not use drugs.  Allergies  Allergen Reactions   Peanut Butter Flavor Anaphylaxis   Penicillins Anaphylaxis, Hives and Swelling    Has patient had a PCN reaction causing immediate rash, facial/tongue/throat swelling, SOB or lightheadedness with hypotension: yes Has patient had a PCN reaction causing severe rash involving mucus membranes or skin necrosis: no Has patient had a PCN reaction that required hospitalization: yes Has patient had a PCN reaction occurring within the last 10 years: no If all of the above answers  are "NO", then may proceed with Cephalosporin use.    Banana Hives   Orange Concentrate [Flavoring Agent] Hives   Other Hives    Reaction to peas and cauliflower    Family History  Problem Relation Age of Onset   Other Mother    Other Father    Diabetes Neg Hx     Prior to Admission medications   Medication Sig Start Date End Date Taking? Authorizing Provider  cetirizine (ZYRTEC) 10 MG tablet Take 10 mg by mouth at bedtime.    [provider]  Ferrous Sulfate (IRON PO) Take 1 tablet by mouth daily.    [provider]  ibuprofen (ADVIL) 600 MG tablet Take 1 tablet (600 mg total) by mouth every 6 (six) hours as needed for moderate pain. Patient not taking: No sig reported 02/17/21   Fayrene Helperran, [place], PA-C  insulin NPH-regular Human (NOVOLIN 70/30 RELION) (70-30) 100 UNIT/ML injection Inject 35 Units into the skin 2 (two) times daily with a meal.    [provider]    Physical Exam: Constitutional: Moderately built and nourished. Vitals:   03/13/21 0840 03/13/21 0945 03/13/21 1425 03/13/21 1500  BP:  (!) 102/59  126/73  Pulse:  100  97  Resp:  (!) 35  20  Temp:   97.9 F (36.6 C)   TempSrc:   Oral   SpO2:  100%  100%  Weight: 71.8 kg     Height: 5\' 7"  (1.702 m)      Eyes: Anicteric no pallor. ENMT:  No discharge from the ears eyes nose and mouth. Neck: No mass felt.  No neck rigidity. Respiratory: No rhonchi or crepitations. Cardiovascular: S1-S2 heard. Abdomen: Soft nontender bowel sound present. Musculoskeletal: No edema. Skin: No rash. Neurologic: Patient is sleepy but oriented to time place and person.  Moving all extremities. Psychiatric: Denies any suicidal ideation.   Labs on Admission: I have personally reviewed following labs and imaging studies  CBC: Recent Labs  Lab 03/09/21 1105 03/09/21 1120 03/13/21 0928 03/13/21 0937  WBC 13.3*  --  7.8  --   NEUTROABS  --   --  6.3  --   HGB 11.3* 13.3 10.5* 11.9*  HCT 38.0 39.0 35.4* 35.0*   MCV 76.0*  --  75.8*  --   PLT 356  --  309  --    Basic Metabolic Panel: Recent Labs  Lab 03/09/21 1803 03/09/21 2159 03/10/21 0219 03/10/21 0644 03/13/21 0928 03/13/21 0937  NA 135 137 137 138 135 134*  K 4.4 4.3 3.7 3.7 4.3 4.3  CL 107 108 113* 112* 99  --   CO2 12* 11* 15* 16* 10*  --   GLUCOSE 216* 211* 209* 214* 549*  --   BUN 12 12 13 13 16   --   CREATININE 0.85 0.87 0.91 0.83 0.90  --   CALCIUM 7.8* 8.7* 8.1* 8.2* 8.8*  --    GFR: Estimated Creatinine Clearance: 90.5 mL/min (by C-G formula based on SCr of 0.9 mg/dL). Liver Function Tests: Recent Labs  Lab 03/09/21 1105 03/13/21 0928  AST 40 29  ALT 31 30  ALKPHOS 117 95  BILITOT 1.6* 0.5  PROT 8.1 7.2  ALBUMIN 3.7 3.9   Recent Labs  Lab 03/09/21 1105  LIPASE 20   No results for input(s): AMMONIA in the last 168 hours. Coagulation Profile: No results for input(s): INR, PROTIME in the last 168 hours. Cardiac Enzymes: No results for input(s): CKTOTAL, CKMB, CKMBINDEX, TROPONINI in the last 168 hours. BNP (last 3 results) No results for input(s): PROBNP in the last 8760 hours. HbA1C: No results for input(s): HGBA1C in the last 72 hours. CBG: Recent Labs  Lab 03/13/21 1114 03/13/21 1155 03/13/21 1228 03/13/21 1314 03/13/21 1333  GLUCAP 476* 539* 451* 423* 340*   Lipid Profile: No results for input(s): CHOL, HDL, LDLCALC, TRIG, CHOLHDL, LDLDIRECT in the last 72 hours. Thyroid Function Tests: No results for input(s): TSH, T4TOTAL, FREET4, T3FREE, THYROIDAB in the last 72 hours. Anemia Panel: No results for input(s): VITAMINB12, FOLATE, FERRITIN, TIBC, IRON, RETICCTPCT in the last 72 hours. Urine analysis:    Component Value Date/Time   COLORURINE COLORLESS (A) 03/13/2021 0928   APPEARANCEUR CLEAR 03/13/2021 0928   LABSPEC 1.027 03/13/2021 0928   PHURINE 5.5 03/13/2021 0928   GLUCOSEU >1,000 (A) 03/13/2021 0928   HGBUR MODERATE (A) 03/13/2021 0928   BILIRUBINUR NEGATIVE 03/13/2021 0928    KETONESUR >80 (A) 03/13/2021 0928   PROTEINUR TRACE (A) 03/13/2021 0928   UROBILINOGEN 0.2 11/25/2013 2206   NITRITE NEGATIVE 03/13/2021 0928   LEUKOCYTESUR NEGATIVE 03/13/2021 0928   Sepsis Labs: @LABRCNTIP (procalcitonin:4,lacticidven:4) ) Recent Results (from the past 240 hour(s))  Resp Panel by RT-PCR (Flu A&B, Covid) Urine, Clean Catch     Status: None   Collection Time: 03/09/21 11:05 AM   Specimen: Urine, Clean Catch; Nasopharyngeal(NP) swabs in vial transport medium  Result Value Ref Range Status   SARS Coronavirus 2 by RT PCR NEGATIVE NEGATIVE Final    Comment: (NOTE) SARS-CoV-2 target nucleic  acids are NOT DETECTED.  The SARS-CoV-2 RNA is generally detectable in upper respiratory specimens during the acute phase of infection. The lowest concentration of SARS-CoV-2 viral copies this assay can detect is 138 copies/mL. A negative result does not preclude SARS-Cov-2 infection and should not be used as the sole basis for treatment or other patient management decisions. A negative result may occur with  improper specimen collection/handling, submission of specimen other than nasopharyngeal swab, presence of viral mutation(s) within the areas targeted by this assay, and inadequate number of viral copies(<138 copies/mL). A negative result must be combined with clinical observations, patient history, and epidemiological information. The expected result is Negative.  Fact Sheet for Patients:  BloggerCourse.com  Fact Sheet for Healthcare Providers:  SeriousBroker.it  This test is no t yet approved or cleared by the [place] FDA and  has been authorized for detection and/or diagnosis of SARS-CoV-2 by FDA under an Emergency Use Authorization (EUA). This EUA will remain  in effect (meaning this test can be used) for the duration of the COVID-19 declaration under Section 564(b)(1) of the Act, 21 U.S.C.section 360bbb-3(b)(1),  unless the authorization is terminated  or revoked sooner.       Influenza A by PCR NEGATIVE NEGATIVE Final   Influenza B by PCR NEGATIVE NEGATIVE Final    Comment: (NOTE) The Xpert Xpress SARS-CoV-2/FLU/RSV plus assay is intended as an aid in the diagnosis of influenza from Nasopharyngeal swab specimens and should not be used as a sole basis for treatment. Nasal washings and aspirates are unacceptable for Xpert Xpress SARS-CoV-2/FLU/RSV testing.  Fact Sheet for Patients: BloggerCourse.com  Fact Sheet for Healthcare Providers: SeriousBroker.it  This test is not yet approved or cleared by the [place] FDA and has been authorized for detection and/or diagnosis of SARS-CoV-2 by FDA under an Emergency Use Authorization (EUA). This EUA will remain in effect (meaning this test can be used) for the duration of the COVID-19 declaration under Section 564(b)(1) of the Act, 21 U.S.C. section 360bbb-3(b)(1), unless the authorization is terminated or revoked.  Performed at [facility], [address] [place], [place] [address]   MRSA Next Gen by PCR, Nasal     Status: None   Collection Time: 03/09/21  9:30 PM   Specimen: Nasal Mucosa; Nasal Swab  Result Value Ref Range Status   MRSA by PCR Next Gen NOT DETECTED NOT DETECTED Final    Comment: (NOTE) The GeneXpert MRSA Assay (FDA approved for NASAL specimens only), is one component of a comprehensive MRSA colonization surveillance program. It is not intended to diagnose MRSA infection nor to guide or monitor treatment for MRSA infections. Test performance is not FDA approved in patients less than 8 years old. Performed at [facility], [address], [place], [place] [address]   Resp Panel by RT-PCR (Flu A&B, Covid) Nasopharyngeal Swab     Status: None   Collection Time: 03/13/21  9:28 AM   Specimen: Nasopharyngeal Swab; Nasopharyngeal(NP) swabs in  vial transport medium  Result Value Ref Range Status   SARS Coronavirus 2 by RT PCR NEGATIVE NEGATIVE Final    Comment: (NOTE) SARS-CoV-2 target nucleic acids are NOT DETECTED.  The SARS-CoV-2 RNA is generally detectable in upper respiratory specimens during the acute phase of infection. The lowest concentration of SARS-CoV-2 viral copies this assay can detect is 138 copies/mL. A negative result does not preclude SARS-Cov-2 infection and should not be used as the sole basis for treatment or other patient  management decisions. A negative result may occur with  improper specimen collection/handling, submission of specimen other than nasopharyngeal swab, presence of viral mutation(s) within the areas targeted by this assay, and inadequate number of viral copies(<138 copies/mL). A negative result must be combined with clinical observations, patient history, and epidemiological information. The expected result is Negative.  Fact Sheet for Patients:  BloggerCourse.com  Fact Sheet for Healthcare Providers:  SeriousBroker.it  This test is no t yet approved or cleared by the [place] FDA and  has been authorized for detection and/or diagnosis of SARS-CoV-2 by FDA under an Emergency Use Authorization (EUA). This EUA will remain  in effect (meaning this test can be used) for the duration of the COVID-19 declaration under Section 564(b)(1) of the Act, 21 U.S.C.section 360bbb-3(b)(1), unless the authorization is terminated  or revoked sooner.       Influenza A by PCR NEGATIVE NEGATIVE Final   Influenza B by PCR NEGATIVE NEGATIVE Final    Comment: (NOTE) The Xpert Xpress SARS-CoV-2/FLU/RSV plus assay is intended as an aid in the diagnosis of influenza from Nasopharyngeal swab specimens and should not be used as a sole basis for treatment. Nasal washings and aspirates are unacceptable for Xpert Xpress SARS-CoV-2/FLU/RSV testing.  Fact  Sheet for Patients: BloggerCourse.com  Fact Sheet for Healthcare Providers: SeriousBroker.it  This test is not yet approved or cleared by the [place] FDA and has been authorized for detection and/or diagnosis of SARS-CoV-2 by FDA under an Emergency Use Authorization (EUA). This EUA will remain in effect (meaning this test can be used) for the duration of the COVID-19 declaration under Section 564(b)(1) of the Act, 21 U.S.C. section 360bbb-3(b)(1), unless the authorization is terminated or revoked.  Performed at [facility], [address] [place], [place] [address]      Radiological Exams on Admission: DG Chest Portable 1 View  Result Date: 03/13/2021 CLINICAL DATA:  Pneumonia. EXAM: PORTABLE CHEST 1 VIEW COMPARISON:  June 11, 2020. FINDINGS: The heart size and mediastinal contours are within normal limits. Both lungs are clear. The visualized skeletal structures are unremarkable. IMPRESSION: No active disease. Electronically Signed   By: Lupita Raider M.D.   On: 03/13/2021 09:33     Assessment/Plan Principal Problem:   DKA (diabetic ketoacidosis) (HCC) Active Problems:   DKA, type 1 (HCC)    Diabetic ketoacidosis and type 1 diabetes -not sure of patient's compliance with medication.  Patient's last hemoglobin A1c was more than 15 on March 10, 2021.  Patient is presently on IV fluids IV insulin infusion closely follow metabolic panel.  Diabetic education. Microcytic hypochromic anemia -check anemia panel follow CBC.   DVT prophylaxis: Lovenox. Code Status: Full code. Family Communication: Discussed with patient. Disposition Plan: Home when stable. Consults called: None. Admission status: Observation.   Eduard Clos MD [facility] Pager [phone].  If 7PM-7AM, please contact night-coverage [email or web address] Password [facility]  03/13/2021, 3:18 PM

## 2021-03-13 NOTE — ED Notes (Signed)
Pt  Void 600 cc clear yellow urine. Pt vomited x 2 , bile once and clear mucus another.

## 2021-03-13 NOTE — ED Triage Notes (Signed)
Patient left [facility] yesterday and came in today with rapid respiration and nausea.  Pt stated that she has not have her insulin since yesterday.

## 2021-03-13 NOTE — ED Notes (Signed)
IV obtained by MD; ordered labs drawn and sent to lab. VBG obtained and ran. Results given to MD.

## 2021-03-13 NOTE — ED Notes (Signed)
Patient arrived w/ cc of diabetes/recent DKA. Patient was recently admitted to [facility] for treatment of the same. Patient will answer appropriately when asked questions and is alert and oriented, but obviously feeling poorly; endorses this feeling. Nausea and vomiting present at this time. VBG ordered, but awaiting sample. MD to place line via [place].

## 2021-03-14 DIAGNOSIS — E0810 Diabetes mellitus due to underlying condition with ketoacidosis without coma: Secondary | ICD-10-CM

## 2021-03-14 LAB — GLUCOSE, CAPILLARY
Glucose-Capillary: 161 mg/dL — ABNORMAL HIGH (ref 70–99)
Glucose-Capillary: 171 mg/dL — ABNORMAL HIGH (ref 70–99)
Glucose-Capillary: 135 mg/dL — ABNORMAL HIGH (ref 70–99)
Glucose-Capillary: 143 mg/dL — ABNORMAL HIGH (ref 70–99)
Glucose-Capillary: 153 mg/dL — ABNORMAL HIGH (ref 70–99)
Glucose-Capillary: 176 mg/dL — ABNORMAL HIGH (ref 70–99)
Glucose-Capillary: 134 mg/dL — ABNORMAL HIGH (ref 70–99)
Glucose-Capillary: 163 mg/dL — ABNORMAL HIGH (ref 70–99)
Glucose-Capillary: 185 mg/dL — ABNORMAL HIGH (ref 70–99)
Glucose-Capillary: 170 mg/dL — ABNORMAL HIGH (ref 70–99)
Glucose-Capillary: 231 mg/dL — ABNORMAL HIGH (ref 70–99)
Glucose-Capillary: 193 mg/dL — ABNORMAL HIGH (ref 70–99)

## 2021-03-14 LAB — BASIC METABOLIC PANEL
Anion gap: 12 (ref 5–15)
Anion gap: 7 (ref 5–15)
Anion gap: 9 (ref 5–15)
BUN: 12 mg/dL (ref 6–20)
BUN: 11 mg/dL (ref 6–20)
BUN: 10 mg/dL (ref 6–20)
CO2: 19 mmol/L — ABNORMAL LOW (ref 22–32)
CO2: 20 mmol/L — ABNORMAL LOW (ref 22–32)
CO2: 19 mmol/L — ABNORMAL LOW (ref 22–32)
Calcium: 8.4 mg/dL — ABNORMAL LOW (ref 8.9–10.3)
Calcium: 8.1 mg/dL — ABNORMAL LOW (ref 8.9–10.3)
Calcium: 8 mg/dL — ABNORMAL LOW (ref 8.9–10.3)
Chloride: 109 mmol/L (ref 98–111)
Chloride: 112 mmol/L — ABNORMAL HIGH (ref 98–111)
Chloride: 109 mmol/L (ref 98–111)
Creatinine, Ser: 0.77 mg/dL (ref 0.44–1.00)
Creatinine, Ser: 0.78 mg/dL (ref 0.44–1.00)
Creatinine, Ser: 0.63 mg/dL (ref 0.44–1.00)
GFR, Estimated: >60 mL/min (ref >60)
GFR, Estimated: >60 mL/min (ref >60)
GFR, Estimated: >60 mL/min (ref >60)
Glucose, Bld: 139 mg/dL — ABNORMAL HIGH (ref 70–99)
Glucose, Bld: 146 mg/dL — ABNORMAL HIGH (ref 70–99)
Glucose, Bld: 159 mg/dL — ABNORMAL HIGH (ref 70–99)
Potassium: 3.5 mmol/L (ref 3.5–5.1)
Potassium: 3.2 mmol/L — ABNORMAL LOW (ref 3.5–5.1)
Potassium: 3.1 mmol/L — ABNORMAL LOW (ref 3.5–5.1)
Sodium: 140 mmol/L (ref 135–145)
Sodium: 139 mmol/L (ref 135–145)
Sodium: 137 mmol/L (ref 135–145)

## 2021-03-14 LAB — BETA-HYDROXYBUTYRIC ACID
Beta-Hydroxybutyric Acid: 4.29 mmol/L — ABNORMAL HIGH (ref 0.05–0.27)
Beta-Hydroxybutyric Acid: 3.76 mmol/L — ABNORMAL HIGH (ref 0.05–0.27)

## 2021-03-14 MED ORDER — BLOOD GLUCOSE MONITOR KIT: PACK | 0 refills | Status: DC

## 2021-03-14 MED ORDER — INSULIN ASPART PROT & ASPART (70-30 MIX) 100 UNIT/ML ~~LOC~~ SUSP
30.0000 [IU] | Freq: Two times a day (BID) | SUBCUTANEOUS | Status: DC
  Administered 2021-03-14: 30 [IU] via SUBCUTANEOUS
  Filled 2021-03-14: qty 10

## 2021-03-14 MED ORDER — NOVOLIN 70/30 RELION (70-30) 100 UNIT/ML ~~LOC~~ SUSP: 35.0000 [IU] | Freq: Two times a day (BID) | SUBCUTANEOUS | 0 refills | Status: DC

## 2021-03-14 MED ORDER — INSULIN ASPART 100 UNIT/ML IJ SOLN
0.0000 [IU] | Freq: Every day | INTRAMUSCULAR | Status: DC
Start: 2021-03-14 — End: 2021-03-14

## 2021-03-14 MED ORDER — INSULIN ASPART 100 UNIT/ML IJ SOLN
0.0000 [IU] | Freq: Three times a day (TID) | INTRAMUSCULAR | Status: DC
Start: 2021-03-14 — End: 2021-03-14
  Administered 2021-03-14: 3 [IU] via SUBCUTANEOUS
  Administered 2021-03-14: 2 [IU] via SUBCUTANEOUS

## 2021-03-14 MED ORDER — POTASSIUM CHLORIDE CRYS ER 20 MEQ PO TBCR
60.0000 meq | EXTENDED_RELEASE_TABLET | Freq: Two times a day (BID) | ORAL | Status: DC
  Administered 2021-03-14: 60 meq via ORAL
  Filled 2021-03-14: qty 3
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
## 2021-03-14 NOTE — Progress Notes (Signed)
Inpatient Diabetes Program Recommendations  AACE/ADA: New Consensus Statement on Inpatient Glycemic Control (2015)  Target Ranges:  Prepandial:   less than 140 mg/dL      Peak postprandial:   less than 180 mg/dL (1-2 hours)      Critically ill patients:  140 - 180 mg/dL   Lab Results  Component Value Date   GLUCAP 170 (H) 03/14/2021   HGBA1C >15.5 (H) 03/10/2021    Review of Glycemic Control Results for Beverly Wells, Beverly Wells (MRN [id number]) as of 03/14/2021 12:18  Ref. Range 03/14/2021 09:56 03/14/2021 11:01 03/14/2021 12:02  Glucose-Capillary Latest Ref Range: 70 - 99 mg/dL 503 (H) 546 (H) 568 (H)  Diabetes history: DM1 Outpatient Diabetes medications: 70/30 45 units BID Current orders for Inpatient glycemic control: IV insulin/DKA order set  Inpatient Diabetes Program Recommendations:    May consider using 70/30 to transition patient off insulin drip.  Could start 70/30 25 units bid at 1700 and then d/c insulin drip 2 hours later.  Of note patient was here on 7/26 and left AMA.    Thanks,  Beryl Meager, RN, BC-ADM Inpatient Diabetes Coordinator Pager [phone]  (8a-5p)

## 2021-03-17 ENCOUNTER — Encounter (HOSPITAL_BASED_OUTPATIENT_CLINIC_OR_DEPARTMENT_OTHER): Payer: Self-pay | Admitting: *Deleted

## 2021-03-17 ENCOUNTER — Other Ambulatory Visit: Payer: Self-pay

## 2021-03-17 ENCOUNTER — Emergency Department (HOSPITAL_BASED_OUTPATIENT_CLINIC_OR_DEPARTMENT_OTHER)
Admission: EM | Admit: 2021-03-17 | Discharge: 2021-03-18 | Disposition: A | Discharge status: Home / Self Care | Payer: Self-pay | Attending: Emergency Medicine | Admitting: Emergency Medicine

## 2021-03-17 DIAGNOSIS — J069 Acute upper respiratory infection, unspecified: Secondary | ICD-10-CM

## 2021-03-17 DIAGNOSIS — R059 Cough, unspecified: Secondary | ICD-10-CM | POA: Insufficient documentation

## 2021-03-17 DIAGNOSIS — F17290 Nicotine dependence, other tobacco product, uncomplicated: Secondary | ICD-10-CM | POA: Insufficient documentation

## 2021-03-17 DIAGNOSIS — R439 Unspecified disturbances of smell and taste: Secondary | ICD-10-CM | POA: Insufficient documentation

## 2021-03-17 DIAGNOSIS — Z91010 Allergy to peanuts: Secondary | ICD-10-CM | POA: Insufficient documentation

## 2021-03-17 DIAGNOSIS — E1010 Type 1 diabetes mellitus with ketoacidosis without coma: Secondary | ICD-10-CM | POA: Insufficient documentation

## 2021-03-17 DIAGNOSIS — R0981 Nasal congestion: Secondary | ICD-10-CM | POA: Insufficient documentation

## 2021-03-17 DIAGNOSIS — Z794 Long term (current) use of insulin: Secondary | ICD-10-CM | POA: Insufficient documentation

## 2021-03-17 DIAGNOSIS — R6883 Chills (without fever): Secondary | ICD-10-CM | POA: Insufficient documentation

## 2021-03-17 DIAGNOSIS — Z20822 Contact with and (suspected) exposure to covid-19: Secondary | ICD-10-CM | POA: Insufficient documentation

## 2021-03-17 DIAGNOSIS — Z91040 Latex allergy status: Secondary | ICD-10-CM | POA: Insufficient documentation

## 2021-03-17 NOTE — ED Triage Notes (Signed)
C/o body aches cough , chills x 2 days

## 2021-03-18 LAB — RESP PANEL BY RT-PCR (FLU A&B, COVID) ARPGX2
Influenza A by PCR: NEGATIVE
Influenza B by PCR: NEGATIVE
SARS Coronavirus 2 by RT PCR: NEGATIVE

## 2021-03-18 NOTE — ED Provider Notes (Signed)
[facility] EMERGENCY DEPARTMENT Provider Note   CSN: [id number] Arrival date & time: 03/17/21  2246     History Chief Complaint  Patient presents with   Generalized Body Aches    Beverly Wells is a 30 y.o. female.  Patient is a 30 year old female with history of type 1 diabetes.  She presents today with complaints of cough, congestion, chills, and reported loss of taste and smell.  This began 2 days ago and is worsening.  She is concerned about the possibility of COVID-19.  She denies any chest pain or difficulty breathing.  She does report friends who have tested positive recently.  The history is provided by the patient.      Past Medical History:  Diagnosis Date   ALLERGIC RHINITIS 05/07/2010   Diabetes mellitus without complication ([place])    DKA (diabetic ketoacidoses)    IDDM 08/05/2009   PERS HX NONCOMPLIANCE W/MED TX PRS HAZARDS HLTH 08/24/2010    Patient Active Problem List   Diagnosis Date Noted   DKA, type 1 ([place]) 03/13/2021   DKA (diabetic ketoacidosis) ([place]) 03/09/2021   Anemia 09/07/2013   Intractable vomiting 06/07/2012   Lung nodule seen on imaging study 06/07/2012   Dental abscess 01/19/2012   DKA (diabetic ketoacidoses) 10/26/2011   URI (upper respiratory infection) 10/26/2011   ALLERGIC RHINITIS 05/07/2010   IDDM 08/05/2009    Past Surgical History:  Procedure Laterality Date   TONSILLECTOMY       OB History     Gravida  0   Para  0   Term  0   Preterm  0   AB  0   Living  0      SAB  0   IAB  0   Ectopic  0   Multiple  0   Live Births  0           Family History  Problem Relation Age of Onset   Other Mother    Other Father    Diabetes Neg Hx     Social History   Tobacco Use   Smoking status: Every Day    Types: E-cigarettes   Smokeless tobacco: Never  Substance Use Topics   Alcohol use: Yes    Comment: occasional    Drug use: No    Home Medications Prior to Admission medications    Medication Sig Start Date End Date Taking? Authorizing Provider  blood glucose meter kit and supplies KIT Dispense based on patient and insurance preference. Use up to four times daily as directed. 03/14/21   Donne Hazel, MD  cetirizine (ZYRTEC) 10 MG tablet Take 10 mg by mouth at bedtime.    [provider]  Ferrous Sulfate (IRON PO) Take 1 tablet by mouth daily.    [provider]  ibuprofen (ADVIL) 600 MG tablet Take 1 tablet (600 mg total) by mouth every 6 (six) hours as needed for moderate pain. 02/17/21   Domenic Moras, PA-C  insulin NPH-regular Human (NOVOLIN 70/30 RELION) (70-30) 100 UNIT/ML injection Inject 35 Units into the skin 2 (two) times daily with a meal. 03/14/21   Donne Hazel, MD    Allergies    Peanut butter flavor, Penicillins, Banana, Orange concentrate [flavoring agent], Other, and Latex  Review of Systems   Review of Systems  All other systems reviewed and are negative.  Physical Exam Updated Vital Signs BP (!) 145/94   Pulse 97   Temp 98.5 F (36.9 C) (Oral)  Resp 16   Ht '5\' 7"'  (1.702 m)   Wt 72.6 kg   LMP 03/10/2021   SpO2 100%   BMI 25.06 kg/m   Physical Exam Vitals and nursing note reviewed.  Constitutional:      General: She is not in acute distress.    Appearance: She is well-developed. She is not diaphoretic.  HENT:     Head: Normocephalic and atraumatic.  Cardiovascular:     Rate and Rhythm: Normal rate and regular rhythm.     Heart sounds: No murmur heard.   No friction rub. No gallop.  Pulmonary:     Effort: Pulmonary effort is normal. No respiratory distress.     Breath sounds: Normal breath sounds. No wheezing.  Abdominal:     General: Bowel sounds are normal. There is no distension.     Palpations: Abdomen is soft.     Tenderness: There is no abdominal tenderness.  Musculoskeletal:        General: Normal range of motion.     Cervical back: Normal range of motion and neck supple.  Skin:    General: Skin is  warm and dry.  Neurological:     General: No focal deficit present.     Mental Status: She is alert and oriented to person, place, and time.    ED Results / Procedures / Treatments   Labs (all labs ordered are listed, but only abnormal results are displayed) Labs Reviewed  RESP PANEL BY RT-PCR (FLU A&B, COVID) ARPGX2    EKG None  Radiology No results found.  Procedures Procedures   Medications Ordered in ED Medications - No data to display  ED Course  I have reviewed the triage vital signs and the nursing notes.  Pertinent labs & imaging results that were available during my care of the patient were reviewed by me and considered in my medical decision making (see chart for details).    MDM Rules/Calculators/A&P  COVID test is negative.  Patient to be discharged with as needed return.  Symptoms likely viral in nature.  Final Clinical Impression(s) / ED Diagnoses Final diagnoses:  None    Rx / DC Orders ED Discharge Orders     None        Veryl Speak, MD 03/18/21 [phone]

## 2021-03-18 NOTE — Discharge Instructions (Addendum)
Drink plenty of fluids and get plenty of rest.  Take over-the-counter medications as needed for relief of symptoms. 

## 2021-04-30 ENCOUNTER — Emergency Department (HOSPITAL_BASED_OUTPATIENT_CLINIC_OR_DEPARTMENT_OTHER)
Admission: EM | Admit: 2021-04-30 | Discharge: 2021-04-30 | Disposition: A | Discharge status: Home / Self Care | Payer: Self-pay | Attending: Emergency Medicine | Admitting: Emergency Medicine

## 2021-04-30 ENCOUNTER — Encounter (HOSPITAL_BASED_OUTPATIENT_CLINIC_OR_DEPARTMENT_OTHER): Payer: Self-pay | Admitting: Urology

## 2021-04-30 ENCOUNTER — Emergency Department (HOSPITAL_BASED_OUTPATIENT_CLINIC_OR_DEPARTMENT_OTHER): Payer: Self-pay | Admitting: Radiology

## 2021-04-30 ENCOUNTER — Other Ambulatory Visit: Payer: Self-pay

## 2021-04-30 DIAGNOSIS — Z91040 Latex allergy status: Secondary | ICD-10-CM | POA: Insufficient documentation

## 2021-04-30 DIAGNOSIS — Z20822 Contact with and (suspected) exposure to covid-19: Secondary | ICD-10-CM | POA: Insufficient documentation

## 2021-04-30 DIAGNOSIS — Z91010 Allergy to peanuts: Secondary | ICD-10-CM | POA: Insufficient documentation

## 2021-04-30 DIAGNOSIS — E1065 Type 1 diabetes mellitus with hyperglycemia: Secondary | ICD-10-CM | POA: Insufficient documentation

## 2021-04-30 DIAGNOSIS — J069 Acute upper respiratory infection, unspecified: Secondary | ICD-10-CM | POA: Insufficient documentation

## 2021-04-30 DIAGNOSIS — Z794 Long term (current) use of insulin: Secondary | ICD-10-CM | POA: Insufficient documentation

## 2021-04-30 DIAGNOSIS — R739 Hyperglycemia, unspecified: Secondary | ICD-10-CM

## 2021-04-30 DIAGNOSIS — F17290 Nicotine dependence, other tobacco product, uncomplicated: Secondary | ICD-10-CM | POA: Insufficient documentation

## 2021-04-30 LAB — CBC WITH DIFFERENTIAL/PLATELET
Abs Immature Granulocytes: 0.03 10*3/uL (ref 0.00–0.07)
Basophils Absolute: 0 10*3/uL (ref 0.0–0.1)
Basophils Relative: 0 %
Eosinophils Absolute: 0.3 10*3/uL (ref 0.0–0.5)
Eosinophils Relative: 3 %
HCT: 40.4 % (ref 36.0–46.0)
Hemoglobin: 12.3 g/dL (ref 12.0–15.0)
Immature Granulocytes: 0 %
Lymphocytes Relative: 15 %
Lymphs Abs: 1.3 10*3/uL (ref 0.7–4.0)
MCH: 22 pg — ABNORMAL LOW (ref 26.0–34.0)
MCHC: 30.4 g/dL (ref 30.0–36.0)
MCV: 72.3 fL — ABNORMAL LOW (ref 80.0–100.0)
Monocytes Absolute: 0.5 10*3/uL (ref 0.1–1.0)
Monocytes Relative: 5 %
Neutro Abs: 6.7 10*3/uL (ref 1.7–7.7)
Neutrophils Relative %: 77 %
Platelets: 357 10*3/uL (ref 150–400)
RBC: 5.59 MIL/uL — ABNORMAL HIGH (ref 3.87–5.11)
RDW: 15.6 % — ABNORMAL HIGH (ref 11.5–15.5)
WBC: 8.8 10*3/uL (ref 4.0–10.5)
nRBC: 0 % (ref 0.0–0.2)

## 2021-04-30 LAB — URINALYSIS, ROUTINE W REFLEX MICROSCOPIC
Bilirubin Urine: NEGATIVE
Glucose, UA: >1000 mg/dL — AB
Ketones, ur: 40 mg/dL — AB
Leukocytes,Ua: NEGATIVE
Nitrite: NEGATIVE
Protein, ur: 30 mg/dL — AB
Specific Gravity, Urine: 1.028 (ref 1.005–1.030)
pH: 7 (ref 5.0–8.0)

## 2021-04-30 LAB — GROUP A STREP BY PCR: Group A Strep by PCR: NOT DETECTED

## 2021-04-30 LAB — COMPREHENSIVE METABOLIC PANEL
ALT: 14 U/L (ref 0–44)
AST: 18 U/L (ref 15–41)
Albumin: 4.4 g/dL (ref 3.5–5.0)
Alkaline Phosphatase: 126 U/L (ref 38–126)
Anion gap: 13 (ref 5–15)
BUN: 14 mg/dL (ref 6–20)
CO2: 24 mmol/L (ref 22–32)
Calcium: 10.2 mg/dL (ref 8.9–10.3)
Chloride: 94 mmol/L — ABNORMAL LOW (ref 98–111)
Creatinine, Ser: 0.93 mg/dL (ref 0.44–1.00)
GFR, Estimated: >60 mL/min (ref >60)
Glucose, Bld: 485 mg/dL — ABNORMAL HIGH (ref 70–99)
Potassium: 4.4 mmol/L (ref 3.5–5.1)
Sodium: 131 mmol/L — ABNORMAL LOW (ref 135–145)
Total Bilirubin: 0.5 mg/dL (ref 0.3–1.2)
Total Protein: 8.7 g/dL — ABNORMAL HIGH (ref 6.5–8.1)

## 2021-04-30 LAB — LIPASE, BLOOD: Lipase: <10 U/L — ABNORMAL LOW (ref 11–51)

## 2021-04-30 LAB — PREGNANCY, URINE: Preg Test, Ur: NEGATIVE

## 2021-04-30 LAB — RESP PANEL BY RT-PCR (FLU A&B, COVID) ARPGX2
Influenza A by PCR: NEGATIVE
Influenza B by PCR: NEGATIVE
SARS Coronavirus 2 by RT PCR: NEGATIVE

## 2021-04-30 LAB — CBG MONITORING, ED: Glucose-Capillary: 418 mg/dL — ABNORMAL HIGH (ref 70–99)

## 2021-04-30 MED ORDER — ALBUTEROL SULFATE HFA 108 (90 BASE) MCG/ACT IN AERS
2.0000 | INHALATION_SPRAY | RESPIRATORY_TRACT | Status: DC | PRN
  Filled 2021-04-30: qty 6.7

## 2021-04-30 MED ORDER — AEROCHAMBER PLUS FLO-VU MISC
1.0000 | Freq: Once | Status: DC
  Filled 2021-04-30: qty 1

## 2021-04-30 MED ORDER — SODIUM CHLORIDE 0.9 % IV BOLUS
1000.0000 mL | Freq: Once | INTRAVENOUS | Status: AC
  Administered 2021-04-30: 1000 mL via INTRAVENOUS

## 2021-04-30 MED ORDER — ONDANSETRON HCL 4 MG/2ML IJ SOLN
4.0000 mg | Freq: Once | INTRAMUSCULAR | Status: AC
  Administered 2021-04-30: 4 mg via INTRAVENOUS
  Filled 2021-04-30: qty 2

## 2021-04-30 MED ORDER — ONDANSETRON 4 MG PO TBDP: 4.0000 mg | ORAL_TABLET | Freq: Once | ORAL | Status: DC

## 2021-04-30 MED ORDER — IPRATROPIUM-ALBUTEROL 0.5-2.5 (3) MG/3ML IN SOLN
3.0000 mL | Freq: Once | RESPIRATORY_TRACT | Status: AC
  Administered 2021-04-30: 3 mL via RESPIRATORY_TRACT

## 2021-04-30 MED ORDER — IPRATROPIUM-ALBUTEROL 0.5-2.5 (3) MG/3ML IN SOLN
RESPIRATORY_TRACT | Status: AC
  Administered 2021-04-30: 3 mL via RESPIRATORY_TRACT
  Filled 2021-04-30: qty 3

## 2021-04-30 MED ORDER — ACETAMINOPHEN 325 MG PO TABS
650.0000 mg | ORAL_TABLET | Freq: Once | ORAL | Status: AC | PRN
  Administered 2021-04-30: 650 mg via ORAL
  Filled 2021-04-30: qty 2

## 2021-04-30 MED ORDER — INSULIN ASPART 100 UNIT/ML IJ SOLN
5.0000 [IU] | Freq: Once | INTRAMUSCULAR | Status: AC
  Administered 2021-04-30: 5 [IU] via SUBCUTANEOUS

## 2021-04-30 NOTE — ED Triage Notes (Signed)
Body aches, Fever, vomiting, fatigue x 2 days.  Pt reports SOB with exertion.

## 2021-04-30 NOTE — Discharge Instructions (Signed)
Use inhaler 2 puffs every 4 hours as needed for cough, shortness of breath.  Suspect you have a viral process.  Continue to make sliding-scale insulin changes for diabetes.  Follow-up with your primary care doctor.

## 2021-04-30 NOTE — ED Provider Notes (Signed)
[facility] EMERGENCY DEPT Provider Note   CSN: [id number] Arrival date & time: 04/30/21  2135     History Chief Complaint  Patient presents with   Fatigue   Generalized Body Aches   Emesis    Beverly Wells is a 30 y.o. female.  The history is provided by the patient.  URI Presenting symptoms: congestion, cough, fatigue and sore throat   Severity:  Mild Onset quality:  Gradual Duration:  2 days Timing:  Constant Progression:  Unchanged Chronicity:  New Relieved by:  Nothing Worsened by:  Nothing Associated symptoms: no arthralgias, no headaches, no myalgias, no neck pain, no sinus pain, no sneezing, no swollen glands and no wheezing       Past Medical History:  Diagnosis Date   ALLERGIC RHINITIS 05/07/2010   Diabetes mellitus without complication ([place])    DKA (diabetic ketoacidoses)    IDDM 08/05/2009   PERS HX NONCOMPLIANCE W/MED TX PRS HAZARDS HLTH 08/24/2010    Patient Active Problem List   Diagnosis Date Noted   DKA, type 1 ([place]) 03/13/2021   DKA (diabetic ketoacidosis) ([place]) 03/09/2021   Anemia 09/07/2013   Intractable vomiting 06/07/2012   Lung nodule seen on imaging study 06/07/2012   Dental abscess 01/19/2012   DKA (diabetic ketoacidoses) 10/26/2011   URI (upper respiratory infection) 10/26/2011   ALLERGIC RHINITIS 05/07/2010   IDDM 08/05/2009    Past Surgical History:  Procedure Laterality Date   TONSILLECTOMY       OB History     Gravida  0   Para  0   Term  0   Preterm  0   AB  0   Living  0      SAB  0   IAB  0   Ectopic  0   Multiple  0   Live Births  0           Family History  Problem Relation Age of Onset   Other Mother    Other Father    Diabetes Neg Hx     Social History   Tobacco Use   Smoking status: Every Day    Types: E-cigarettes   Smokeless tobacco: Never  Substance Use Topics   Alcohol use: Yes    Comment: occasional    Drug use: No    Home Medications Prior to  Admission medications   Medication Sig Start Date End Date Taking? Authorizing Provider  blood glucose meter kit and supplies KIT Dispense based on patient and insurance preference. Use up to four times daily as directed. 03/14/21   Donne Hazel, MD  cetirizine (ZYRTEC) 10 MG tablet Take 10 mg by mouth at bedtime.    [provider]  Ferrous Sulfate (IRON PO) Take 1 tablet by mouth daily.    [provider]  ibuprofen (ADVIL) 600 MG tablet Take 1 tablet (600 mg total) by mouth every 6 (six) hours as needed for moderate pain. 02/17/21   Domenic Moras, PA-C  insulin NPH-regular Human (NOVOLIN 70/30 RELION) (70-30) 100 UNIT/ML injection Inject 35 Units into the skin 2 (two) times daily with a meal. 03/14/21   Donne Hazel, MD    Allergies    Peanut butter flavor, Penicillins, Banana, Orange concentrate [flavoring agent], Other, and Latex  Review of Systems   Review of Systems  Constitutional:  Positive for fatigue.  HENT:  Positive for congestion and sore throat. Negative for sinus pain and sneezing.   Respiratory:  Positive for  cough. Negative for wheezing.   Musculoskeletal:  Negative for arthralgias, myalgias and neck pain.  Neurological:  Negative for headaches.   Physical Exam Updated Vital Signs BP 134/81   Pulse (!) 109   Temp 98.6 F (37 C) (Oral)   Resp 19   Ht $R'5\' 7"'mg$  (1.702 m)   Wt 70.3 kg   SpO2 100%   BMI 24.28 kg/m   Physical Exam  ED Results / Procedures / Treatments   Labs (all labs ordered are listed, but only abnormal results are displayed) Labs Reviewed  CBC WITH DIFFERENTIAL/PLATELET - Abnormal; Notable for the following components:      Result Value   RBC 5.59 (*)    MCV 72.3 (*)    MCH 22.0 (*)    RDW 15.6 (*)    All other components within normal limits  COMPREHENSIVE METABOLIC PANEL - Abnormal; Notable for the following components:   Sodium 131 (*)    Chloride 94 (*)    Glucose, Bld 485 (*)    Total Protein 8.7 (*)    All other  components within normal limits  LIPASE, BLOOD - Abnormal; Notable for the following components:   Lipase <10 (*)    All other components within normal limits  URINALYSIS, ROUTINE W REFLEX MICROSCOPIC - Abnormal; Notable for the following components:   Color, Urine COLORLESS (*)    Glucose, UA >1,000 (*)    Hgb urine dipstick SMALL (*)    Ketones, ur 40 (*)    Protein, ur 30 (*)    All other components within normal limits  RESP PANEL BY RT-PCR (FLU A&B, COVID) ARPGX2  GROUP A STREP BY PCR  PREGNANCY, URINE    EKG None  Radiology DG Chest 2 View  Result Date: 04/30/2021 CLINICAL DATA:  Shortness of breath and cough EXAM: CHEST - 2 VIEW COMPARISON:  03/13/2021 FINDINGS: The heart size and mediastinal contours are within normal limits. Both lungs are clear. The visualized skeletal structures are unremarkable. IMPRESSION: No active cardiopulmonary disease. Electronically Signed   By: Ulyses Jarred M.D.   On: 04/30/2021 22:39    Procedures Procedures   Medications Ordered in ED Medications  albuterol (VENTOLIN HFA) 108 (90 Base) MCG/ACT inhaler 2 puff (has no administration in time range)  acetaminophen (TYLENOL) tablet 650 mg (650 mg Oral Given 04/30/21 2159)  ondansetron (ZOFRAN) injection 4 mg (4 mg Intravenous Given 04/30/21 2214)  ipratropium-albuterol (DUONEB) 0.5-2.5 (3) MG/3ML nebulizer solution 3 mL (3 mLs Nebulization Given 04/30/21 2223)  sodium chloride 0.9 % bolus 1,000 mL (1,000 mLs Intravenous New Bag/Given 04/30/21 2227)  insulin aspart (novoLOG) injection 5 Units (5 Units Subcutaneous Given 04/30/21 2248)    ED Course  I have reviewed the triage vital signs and the nursing notes.  Pertinent labs & imaging results that were available during my care of the patient were reviewed by me and considered in my medical decision making (see chart for details).    MDM Rules/Calculators/A&P                           Verlin Dike is here with congestion, cough,  fatigue, sore throat, URI symptoms.  History of diabetes.  Has some maybe mild wheezing on exam but otherwise exam is unremarkable.  No abdominal tenderness.  Blood sugar she states has been in the 300s.  She feels weak and fatigued.  Neurologically she appears intact.  She has had dry cough.  Has  had chills but no definitive fever.  Throat exam is overall unremarkable.  Strep test and COVID test were obtained and are negative.  Flu test is negative.  Pregnancy test negative.  Urinalysis negative for infection.  Chest x-ray no evidence of pneumonia.  Lab work only concerning for hyperglycemia 45.  But no evidence of DKA.  Normal anion gap.  Bicarb is normal.  Patient was given IV fluids and an insulin with great improvement.  We will give her an inhaler to use as needed as I suspect likely a viral process.  Inhaler will likely help with cough.  Overall suspect viral process.  Recommend ongoing sliding scale adjustments for her blood sugars.  Discharged in good condition.  Understands return precautions.  This chart was dictated using voice recognition software.  Despite best efforts to proofread,  errors can occur which can change the documentation meaning.   Final Clinical Impression(s) / ED Diagnoses Final diagnoses:  Upper respiratory tract infection, unspecified type  Hyperglycemia    Rx / DC Orders ED Discharge Orders     None        Lennice Sites, DO 04/30/21 2310
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
## 2021-06-28 ENCOUNTER — Encounter (HOSPITAL_BASED_OUTPATIENT_CLINIC_OR_DEPARTMENT_OTHER): Payer: Self-pay

## 2021-06-28 ENCOUNTER — Emergency Department (HOSPITAL_BASED_OUTPATIENT_CLINIC_OR_DEPARTMENT_OTHER): Payer: Self-pay

## 2021-06-28 ENCOUNTER — Emergency Department (HOSPITAL_BASED_OUTPATIENT_CLINIC_OR_DEPARTMENT_OTHER)
Admission: EM | Admit: 2021-06-28 | Discharge: 2021-06-28 | Disposition: A | Discharge status: Home / Self Care | Payer: Self-pay | Attending: Emergency Medicine | Admitting: Emergency Medicine

## 2021-06-28 ENCOUNTER — Other Ambulatory Visit: Payer: Self-pay

## 2021-06-28 DIAGNOSIS — Z794 Long term (current) use of insulin: Secondary | ICD-10-CM | POA: Insufficient documentation

## 2021-06-28 DIAGNOSIS — Z91040 Latex allergy status: Secondary | ICD-10-CM | POA: Insufficient documentation

## 2021-06-28 DIAGNOSIS — E109 Type 1 diabetes mellitus without complications: Secondary | ICD-10-CM | POA: Insufficient documentation

## 2021-06-28 DIAGNOSIS — F17290 Nicotine dependence, other tobacco product, uncomplicated: Secondary | ICD-10-CM | POA: Insufficient documentation

## 2021-06-28 DIAGNOSIS — Z91010 Allergy to peanuts: Secondary | ICD-10-CM | POA: Insufficient documentation

## 2021-06-28 DIAGNOSIS — S0990XA Unspecified injury of head, initial encounter: Secondary | ICD-10-CM | POA: Insufficient documentation

## 2021-06-28 MED ORDER — KETOROLAC TROMETHAMINE 60 MG/2ML IM SOLN
30.0000 mg | Freq: Once | INTRAMUSCULAR | Status: AC
  Administered 2021-06-28: 30 mg via INTRAMUSCULAR
  Filled 2021-06-28: qty 2

## 2021-06-28 MED ORDER — FENTANYL CITRATE PF 50 MCG/ML IJ SOSY
50.0000 ug | PREFILLED_SYRINGE | Freq: Once | INTRAMUSCULAR | Status: AC
  Administered 2021-06-28: 50 ug via INTRAMUSCULAR
  Filled 2021-06-28: qty 1
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
## 2021-06-28 NOTE — ED Notes (Signed)
Pt verbalizes understanding of discharge instructions. Opportunity for questioning and answers were provided. Armand removed by staff, pt discharged from ED to home with friend. Educated to monitor for worsening signs.
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
## 2021-07-21 ENCOUNTER — Encounter (HOSPITAL_BASED_OUTPATIENT_CLINIC_OR_DEPARTMENT_OTHER): Payer: Self-pay

## 2021-07-21 ENCOUNTER — Other Ambulatory Visit: Payer: Self-pay

## 2021-07-21 ENCOUNTER — Emergency Department (HOSPITAL_BASED_OUTPATIENT_CLINIC_OR_DEPARTMENT_OTHER)
Admission: EM | Admit: 2021-07-21 | Discharge: 2021-07-22 | Disposition: A | Discharge status: Home / Self Care | Payer: Self-pay | Attending: Emergency Medicine | Admitting: Emergency Medicine

## 2021-07-21 ENCOUNTER — Emergency Department (HOSPITAL_BASED_OUTPATIENT_CLINIC_OR_DEPARTMENT_OTHER): Payer: Self-pay | Admitting: Radiology

## 2021-07-21 DIAGNOSIS — R350 Frequency of micturition: Secondary | ICD-10-CM | POA: Insufficient documentation

## 2021-07-21 DIAGNOSIS — F17290 Nicotine dependence, other tobacco product, uncomplicated: Secondary | ICD-10-CM | POA: Insufficient documentation

## 2021-07-21 DIAGNOSIS — Z20822 Contact with and (suspected) exposure to covid-19: Secondary | ICD-10-CM | POA: Insufficient documentation

## 2021-07-21 DIAGNOSIS — E1110 Type 2 diabetes mellitus with ketoacidosis without coma: Secondary | ICD-10-CM | POA: Insufficient documentation

## 2021-07-21 DIAGNOSIS — H5789 Other specified disorders of eye and adnexa: Secondary | ICD-10-CM | POA: Insufficient documentation

## 2021-07-21 DIAGNOSIS — R300 Dysuria: Secondary | ICD-10-CM | POA: Insufficient documentation

## 2021-07-21 DIAGNOSIS — R059 Cough, unspecified: Secondary | ICD-10-CM | POA: Insufficient documentation

## 2021-07-21 NOTE — ED Triage Notes (Signed)
Pt presents with generalized body aches, productive cough w/yellow colored sputum. Drainage from bilateral eyes, eyes are "crusted over" in the morning. Pt also reports bladder pain with increase urinary frequency, decrease output and burning after urination x3 days

## 2021-07-22 ENCOUNTER — Emergency Department (HOSPITAL_BASED_OUTPATIENT_CLINIC_OR_DEPARTMENT_OTHER): Payer: Self-pay | Admitting: Radiology

## 2021-07-22 LAB — RESP PANEL BY RT-PCR (FLU A&B, COVID) ARPGX2
Influenza A by PCR: NEGATIVE
Influenza B by PCR: NEGATIVE
SARS Coronavirus 2 by RT PCR: NEGATIVE

## 2021-07-22 LAB — URINALYSIS, ROUTINE W REFLEX MICROSCOPIC
Bilirubin Urine: NEGATIVE
Glucose, UA: >1000 mg/dL — AB
Hgb urine dipstick: NEGATIVE
Ketones, ur: NEGATIVE mg/dL
Leukocytes,Ua: NEGATIVE
Nitrite: NEGATIVE
Protein, ur: NEGATIVE mg/dL
Specific Gravity, Urine: 1.022 (ref 1.005–1.030)
pH: 6.5 (ref 5.0–8.0)

## 2021-07-22 LAB — PREGNANCY, URINE: Preg Test, Ur: NEGATIVE

## 2021-07-22 MED ORDER — BACITRACIN-POLYMYXIN B 500-10000 UNIT/GM OP OINT: 1.0000 "application " | TOPICAL_OINTMENT | Freq: Two times a day (BID) | OPHTHALMIC | 0 refills | Status: AC

## 2021-07-22 MED ORDER — NITROFURANTOIN MONOHYD MACRO 100 MG PO CAPS: 100.0000 mg | ORAL_CAPSULE | Freq: Two times a day (BID) | ORAL | 0 refills | Status: DC

## 2021-07-22 NOTE — ED Provider Notes (Signed)
[facility] EMERGENCY [facility] Emergency Department Provider Note MRN:  [id number]  Arrival date & time: 07/22/21     Chief Complaint   Urinary Frequency, Cough, and Conjunctivitis   History of Present Illness   Beverly Wells is a 30 y.o. year-old female with a history of DM presenting to the ED with chief complaint of cough.  3 weeks of cough, got better, but then came back worse 3 days ago.  Also with crusty eyes in the morning and burning with urination.  No chest pain or SOB.  No abdominal pain, no vaginal bleeding or dc.  Symptoms constant, mild, no exacerbating or alleviating factors.  Review of Systems  A complete 10 system review of systems was obtained and all systems are negative except as noted in the HPI and PMH.   Patient's Health History    Past Medical History:  Diagnosis Date   ALLERGIC RHINITIS 05/07/2010   Diabetes mellitus without complication (HCC)    DKA (diabetic ketoacidoses)    IDDM 08/05/2009   PERS HX NONCOMPLIANCE W/MED TX PRS HAZARDS HLTH 08/24/2010    Past Surgical History:  Procedure Laterality Date   TONSILLECTOMY      Family History  Problem Relation Age of Onset   Other Mother    Other Father    Diabetes Neg Hx     Social History   Socioeconomic History   Marital status: Single    Spouse name: Not on file   Number of children: Not on file   Years of education: Not on file   Highest education level: Not on file  Occupational History   Not on file  Tobacco Use   Smoking status: Every Day    Types: E-cigarettes   Smokeless tobacco: Never  Substance and Sexual Activity   Alcohol use: Yes    Comment: occasional    Drug use: No   Sexual activity: Not on file  Other Topics Concern   Not on file  Social History Narrative   ** Merged History Encounter **       Lives with aunt (Myrna Turner)   High school student.   Social Determinants of Health   Financial Resource Strain: Not on file  Food Insecurity: Not on file   Transportation Needs: Not on file  Physical Activity: Not on file  Stress: Not on file  Social Connections: Not on file  Intimate Partner Violence: Not on file     Physical Exam   Vitals:   07/21/21 2314  BP: (!) 141/95  Pulse: 100  Resp: 19  Temp: 99.1 F (37.3 C)  SpO2: 100%    CONSTITUTIONAL:  well-appearing, NAD NEURO:  Alert and oriented x 3, no focal deficits EYES:  eyes equal and reactive ENT/NECK:  no LAD, no JVD CARDIO:  regular rate, well-perfused, normal S1 and S2 PULM:  CTAB no wheezing or rhonchi GI/GU:  normal bowel sounds, non-distended, non-tender MSK/SPINE:  No gross deformities, no edema SKIN:  no rash, atraumatic PSYCH:  Appropriate speech and behavior  *Additional and/or pertinent findings included in MDM below  Diagnostic and Interventional Summary    EKG Interpretation  Date/Time:    Ventricular Rate:    PR Interval:    QRS Duration:   QT Interval:    QTC Calculation:   R Axis:     Text Interpretation:         Labs Reviewed  URINALYSIS, ROUTINE W REFLEX MICROSCOPIC - Abnormal; Notable for the following components:  Result Value   Color, Urine COLORLESS (*)    Glucose, UA >1,000 (*)    Bacteria, UA RARE (*)    All other components within normal limits  RESP PANEL BY RT-PCR (FLU A&B, COVID) ARPGX2  PREGNANCY, URINE    DG Chest 2 View  Final Result      Medications - No data to display   Procedures  /  Critical Care Procedures  ED Course and Medical Decision Making  I have reviewed the triage vital signs, the nursing notes, and pertinent available records from the EMR.  Listed above are laboratory and imaging tests that I personally ordered, reviewed, and interpreted and then considered in my medical decision making (see below for details).  Suspect URI vs secondary bacterial pneumonia.  Smokes cigs.  Vitals normal.  CXR normal,  Awaiting UA.  Conjunctiva appear normal.       Elmer Sow. Pilar Plate, MD [facility]  Emergency Medicine [facility] [email or web address]  Final Clinical Impressions(s) / ED Diagnoses     ICD-10-CM   1. Eye discharge  H57.89     2. Dysuria  R30.0       ED Discharge Orders          Ordered    bacitracin-polymyxin b (POLYSPORIN) ophthalmic ointment  Every 12 hours        07/22/21 0253    nitrofurantoin, macrocrystal-monohydrate, (MACROBID) 100 MG capsule  2 times daily        07/22/21 0253             Discharge Instructions Discussed with and Provided to Patient:     Discharge Instructions      You were evaluated in the Emergency Department and after careful evaluation, we did not find any emergent condition requiring admission or further testing in the hospital.  Your exam/testing today was overall reassuring.  Please return to the Emergency Department if you experience any worsening of your condition.  Thank you for allowing [place] to be a part of your care.         Sabas Sous, MD 07/22/21 [phone]

## 2021-07-22 NOTE — Discharge Instructions (Signed)
You were evaluated in the Emergency Department and after careful evaluation, we did not find any emergent condition requiring admission or further testing in the hospital.  Your exam/testing today was overall reassuring.  Please return to the Emergency Department if you experience any worsening of your condition.  Thank you for allowing us to be a part of your care.  

## 2021-08-09 ENCOUNTER — Other Ambulatory Visit: Payer: Self-pay

## 2021-08-09 ENCOUNTER — Emergency Department (HOSPITAL_BASED_OUTPATIENT_CLINIC_OR_DEPARTMENT_OTHER)
Admission: EM | Admit: 2021-08-09 | Discharge: 2021-08-09 | Disposition: A | Discharge status: Home / Self Care | Payer: Self-pay | Attending: Emergency Medicine | Admitting: Emergency Medicine

## 2021-08-09 ENCOUNTER — Encounter (HOSPITAL_BASED_OUTPATIENT_CLINIC_OR_DEPARTMENT_OTHER): Payer: Self-pay | Admitting: *Deleted

## 2021-08-09 DIAGNOSIS — Z91040 Latex allergy status: Secondary | ICD-10-CM | POA: Insufficient documentation

## 2021-08-09 DIAGNOSIS — F17290 Nicotine dependence, other tobacco product, uncomplicated: Secondary | ICD-10-CM | POA: Insufficient documentation

## 2021-08-09 DIAGNOSIS — Z23 Encounter for immunization: Secondary | ICD-10-CM | POA: Insufficient documentation

## 2021-08-09 DIAGNOSIS — E109 Type 1 diabetes mellitus without complications: Secondary | ICD-10-CM | POA: Insufficient documentation

## 2021-08-09 DIAGNOSIS — Z794 Long term (current) use of insulin: Secondary | ICD-10-CM | POA: Insufficient documentation

## 2021-08-09 DIAGNOSIS — W268XXA Contact with other sharp object(s), not elsewhere classified, initial encounter: Secondary | ICD-10-CM | POA: Insufficient documentation

## 2021-08-09 DIAGNOSIS — S61211A Laceration without foreign body of left index finger without damage to nail, initial encounter: Secondary | ICD-10-CM | POA: Insufficient documentation

## 2021-08-09 DIAGNOSIS — F419 Anxiety disorder, unspecified: Secondary | ICD-10-CM | POA: Insufficient documentation

## 2021-08-09 MED ORDER — LORAZEPAM 1 MG PO TABS
1.0000 mg | ORAL_TABLET | Freq: Once | ORAL | Status: AC
  Administered 2021-08-09: 14:00:00 1 mg via ORAL
  Filled 2021-08-09: qty 1

## 2021-08-09 MED ORDER — HYDROCODONE-ACETAMINOPHEN 5-325 MG PO TABS
1.0000 | ORAL_TABLET | Freq: Once | ORAL | Status: AC
  Administered 2021-08-09: 14:00:00 1 via ORAL
  Filled 2021-08-09: qty 1

## 2021-08-09 MED ORDER — LIDOCAINE-EPINEPHRINE-TETRACAINE (LET) TOPICAL GEL
3.0000 mL | Freq: Once | TOPICAL | Status: DC
  Filled 2021-08-09: qty 3

## 2021-08-09 MED ORDER — LIDOCAINE HCL (PF) 1 % IJ SOLN
INTRAMUSCULAR | Status: AC
  Administered 2021-08-09: 15:00:00 5 mL
  Filled 2021-08-09: qty 5

## 2021-08-09 MED ORDER — DOXYCYCLINE HYCLATE 100 MG PO TABS
100.0000 mg | ORAL_TABLET | Freq: Once | ORAL | Status: AC
  Administered 2021-08-09: 15:00:00 100 mg via ORAL
  Filled 2021-08-09: qty 1

## 2021-08-09 MED ORDER — LIDOCAINE HCL 2 % IJ SOLN: 20.0000 mL | Freq: Once | INTRAMUSCULAR | Status: DC

## 2021-08-09 MED ORDER — BENZOCAINE 20 % MT AERO
INHALATION_SPRAY | Freq: Once | OROMUCOSAL | Status: DC
  Filled 2021-08-09: qty 57

## 2021-08-09 MED ORDER — DOXYCYCLINE HYCLATE 100 MG PO CAPS: 100.0000 mg | ORAL_CAPSULE | Freq: Two times a day (BID) | ORAL | 0 refills | Status: AC

## 2021-08-09 MED ORDER — TETANUS-DIPHTH-ACELL PERTUSSIS 5-2.5-18.5 LF-MCG/0.5 IM SUSY
0.5000 mL | PREFILLED_SYRINGE | Freq: Once | INTRAMUSCULAR | Status: AC
  Administered 2021-08-09: 15:00:00 0.5 mL via INTRAMUSCULAR
  Filled 2021-08-09: qty 0.5

## 2021-08-09 NOTE — ED Provider Notes (Signed)
[facility] EMERGENCY DEPARTMENT Provider Note   CSN: [id number] Arrival date & time: 08/09/21  1400     History Chief Complaint  Patient presents with   Extremity Laceration    Beverly Wells is a 30 y.o. female with a past medical history of diabetes presenting today with a laceration to her left index finger.  Just prior to arrival patient was opening a soda can and cut open her finger.  Unknown last tetanus.  Reports anxiety and low pain tolerance.   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   PERS HX NONCOMPLIANCE W/MED TX PRS HAZARDS HLTH 08/24/2010    Patient Active Problem List   Diagnosis Date Noted   DKA, type 1 ([place]) 03/13/2021   DKA (diabetic ketoacidosis) ([place]) 03/09/2021   Anemia 09/07/2013   Intractable vomiting 06/07/2012   Lung nodule seen on imaging study 06/07/2012   Dental abscess 01/19/2012   DKA (diabetic ketoacidoses) 10/26/2011   URI (upper respiratory infection) 10/26/2011   ALLERGIC RHINITIS 05/07/2010   IDDM 08/05/2009    Past Surgical History:  Procedure Laterality Date   TONSILLECTOMY       OB History     Gravida  0   Para  0   Term  0   Preterm  0   AB  0   Living  0      SAB  0   IAB  0   Ectopic  0   Multiple  0   Live Births  0           Family History  Problem Relation Age of Onset   Other Mother    Other Father    Diabetes Neg Hx     Social History   Tobacco Use   Smoking status: Every Day    Types: E-cigarettes   Smokeless tobacco: Never  Substance Use Topics   Alcohol use: Yes    Comment: occasional    Drug use: No    Home Medications Prior to Admission medications   Medication Sig Start Date End Date Taking? Authorizing Provider  blood glucose meter kit and supplies KIT Dispense based on patient and insurance preference. Use up to four times  daily as directed. 03/14/21   Donne Hazel, MD  cetirizine (ZYRTEC) 10 MG tablet Take 10 mg by mouth at bedtime.    [provider]  Ferrous Sulfate (IRON PO) Take 1 tablet by mouth daily.    [provider]  ibuprofen (ADVIL) 600 MG tablet Take 1 tablet (600 mg total) by mouth every 6 (six) hours as needed for moderate pain. 02/17/21   Domenic Moras, PA-C  insulin NPH-regular Human (NOVOLIN 70/30 RELION) (70-30) 100 UNIT/ML injection Inject 35 Units into the skin 2 (two) times daily with a meal. 03/14/21   Donne Hazel, MD  nitrofurantoin, macrocrystal-monohydrate, (MACROBID) 100 MG capsule Take 1 capsule (100 mg total) by mouth 2 (two) times daily. 07/22/21   Maudie Flakes, MD    Allergies    Peanut butter flavor, Penicillins, Banana, Orange concentrate [flavoring agent], Other, and Latex  Review of Systems   Review of Systems  Skin:  Positive for wound.  Allergic/Immunologic: Positive for immunocompromised state.  Neurological:  Positive for numbness (Feels as though her finger is going numb).   Physical Exam Updated Vital Signs BP 123/89  Pulse (!) 110    Temp 97.7 F (36.5 C)    Resp 18    Ht '5\' 7"'  (1.702 m)    Wt 72.6 kg    LMP 08/03/2021    SpO2 100%    BMI 25.06 kg/m   Physical Exam Vitals and nursing note reviewed.  Constitutional:      Appearance: Normal appearance.  HENT:     Head: Normocephalic and atraumatic.  Eyes:     General: No scleral icterus.    Conjunctiva/sclera: Conjunctivae normal.  Pulmonary:     Effort: Pulmonary effort is normal. No respiratory distress.  Skin:    General: Skin is warm and dry.     Findings: No rash.     Comments: 1.5 cm laceration to the palmar surface of the index finger.  Bleeding controlled.  No exposed bone, nerves.  Full range of motion of DIP and sensation intact.  Neurological:     Mental Status: She is alert.  Psychiatric:     Comments: Very anxious, hyperventilating and thrashing on stretcher    ED  Results / Procedures / Treatments   Labs (all labs ordered are listed, but only abnormal results are displayed) Labs Reviewed - No data to display  EKG None  Radiology No results found.  Procedures .Marland KitchenLaceration Repair  Date/Time: 08/09/2021 3:19 PM Performed by: Rhae Hammock, PA-C Authorized by: Rhae Hammock, PA-C   Consent:    Consent obtained:  Verbal   Consent given by:  Patient   Risks, benefits, and alternatives were discussed: yes     Risks discussed:  Infection, pain, poor wound healing, poor cosmetic result and need for additional repair Universal protocol:    Patient identity confirmed:  Verbally with patient Anesthesia:    Anesthesia method:  Topical application, local infiltration and nerve block   Local anesthetic:  Lidocaine 1% w/o epi   Block needle gauge:  25 G   Block anesthetic:  Lidocaine 1% w/o epi   Block technique:  Palmar   Block injection procedure:  Introduced needle Laceration details:    Location:  Hand   Length (cm):  1.5   Depth (mm):  0.5 Pre-procedure details:    Preparation:  Patient was prepped and draped in usual sterile fashion Exploration:    Hemostasis achieved with:  Epinephrine   Imaging outcome: foreign body not noted     Wound exploration: wound explored through full range of motion and entire depth of wound visualized     Contaminated: no   Treatment:    Area cleansed with:  Saline   Amount of cleaning:  Standard   Irrigation solution:  Sterile water and sterile saline   Irrigation method:  Pressure wash and syringe Skin repair:    Repair method:  Sutures   Suture size:  5-0   Suture material:  Nylon   Suture technique:  Simple interrupted   Number of sutures:  5 Approximation:    Approximation:  Close Repair type:    Repair type:  Simple Post-procedure details:    Procedure completion:  Tolerated   Medications Ordered in ED Medications  lidocaine (PF) (XYLOCAINE) 1 % injection (has no administration  in time range)  LORazepam (ATIVAN) tablet 1 mg (has no administration in time range)  HYDROcodone-acetaminophen (NORCO/VICODIN) 5-325 MG per tablet 1 tablet (has no administration in time range)  Tdap (BOOSTRIX) injection 0.5 mL (has no administration in time range)    ED Course  I have reviewed the triage  vital signs and the nursing notes.  Pertinent labs & imaging results that were available during my care of the patient were reviewed by me and considered in my medical decision making (see chart for details).    MDM Rules/Calculators/A&P 30 year old female presenting with a simple laceration to the left index finger.  She arrived and distress, very anxious about the injury.  She stated over and over again that she had a poor pain tolerance.  She was given both Ativan and Norco and a friend will pick her up.  Digital block was performed.  Also used topical lidocaine around the area to assure the area was fully numb.  5 simple interrupted nylon sutures were placed.  She understands that she needs to get these out in 7 to 10 days.  We discussed signs of infection and this is also attached to her discharge papers.  She is immunocompromise due to her diabetes, so she will watch closely for infection.  For this reason she was also prescribed doxycycline due to her penicillin allergy.  Tetanus updated.  Agreeable and stable to discharge.  Final Clinical Impression(s) / ED Diagnoses Final diagnoses:  Laceration of left index finger without foreign body without damage to nail, initial encounter    Rx / DC Orders Results and diagnoses were explained to the patient. Return precautions discussed in full. Patient had no additional questions and expressed complete understanding.     Rhae Hammock, PA-C 08/09/21 Conner, Alicia, DO 08/10/21 [id number]

## 2021-08-09 NOTE — ED Triage Notes (Signed)
C/o left index finger lac by metal can x 30 mins ago , pt drive self

## 2021-08-09 NOTE — ED Provider Notes (Signed)
Shared visit.  Patient with laceration to left index finger.  Laceration is on the pad of the index finger.  Fairly superficial.  No concern for tendon or nerve injury.  Hemostatic.  Tetanus shot to be updated.  Patient is very anxious about wound repair.  Discussed with her about nerve block versus let and overall we will do nerve block.  We will give her Ativan for anxiety.  Will prescribe antibiotics.  Wound repair to be done by PA.   Virgina Norfolk, DO 08/09/21 1416

## 2021-08-09 NOTE — Discharge Instructions (Addendum)
Your stitches need to come out in 7 to 10 days.  Read the information about laceration care attached to these papers.  Return sooner for any signs of infection.

## 2021-08-09 NOTE — ED Notes (Signed)
ED Provider at bedside. 

## 2021-08-21 ENCOUNTER — Other Ambulatory Visit: Payer: Self-pay

## 2021-08-21 ENCOUNTER — Encounter (HOSPITAL_BASED_OUTPATIENT_CLINIC_OR_DEPARTMENT_OTHER): Payer: Self-pay | Admitting: Emergency Medicine

## 2021-08-21 DIAGNOSIS — Z91010 Allergy to peanuts: Secondary | ICD-10-CM | POA: Insufficient documentation

## 2021-08-21 DIAGNOSIS — Z4802 Encounter for removal of sutures: Secondary | ICD-10-CM | POA: Insufficient documentation

## 2021-08-21 DIAGNOSIS — S61211D Laceration without foreign body of left index finger without damage to nail, subsequent encounter: Secondary | ICD-10-CM | POA: Insufficient documentation

## 2021-08-21 DIAGNOSIS — E1010 Type 1 diabetes mellitus with ketoacidosis without coma: Secondary | ICD-10-CM | POA: Insufficient documentation

## 2021-08-21 DIAGNOSIS — X58XXXD Exposure to other specified factors, subsequent encounter: Secondary | ICD-10-CM | POA: Insufficient documentation

## 2021-08-21 DIAGNOSIS — Z794 Long term (current) use of insulin: Secondary | ICD-10-CM | POA: Insufficient documentation

## 2021-08-21 DIAGNOSIS — Z91040 Latex allergy status: Secondary | ICD-10-CM | POA: Insufficient documentation

## 2021-08-21 NOTE — ED Triage Notes (Signed)
°  Patient comes in for suture removal on L index finger.  Patient had 4 stiches placed on 12/25.  Wound looks ok but still tender.  No obvious signs of infection.  No pain.

## 2021-08-22 ENCOUNTER — Emergency Department (HOSPITAL_BASED_OUTPATIENT_CLINIC_OR_DEPARTMENT_OTHER)
Admission: EM | Admit: 2021-08-22 | Discharge: 2021-08-22 | Disposition: A | Discharge status: Home / Self Care | Payer: Self-pay | Attending: Emergency Medicine | Admitting: Emergency Medicine

## 2021-08-22 DIAGNOSIS — Z4802 Encounter for removal of sutures: Secondary | ICD-10-CM

## 2021-08-22 NOTE — ED Provider Notes (Signed)
[facility] EMERGENCY DEPT Provider Note  CSN: [id number] Arrival date & time: 08/21/21 1914  Chief Complaint(s) Suture / Staple Removal  HPI Beverly Wells is a 31 y.o. female with a past medical history of type 1 diabetes here for suture removal following laceration to the pad of the left index finger.  Mild discomfort to the area reported.  The history is provided by the patient.   Past Medical History Past Medical History:  Diagnosis Date   ALLERGIC RHINITIS 05/07/2010   Diabetes mellitus without complication ([place])    DKA (diabetic ketoacidoses)    IDDM 08/05/2009   PERS HX NONCOMPLIANCE W/MED TX PRS HAZARDS HLTH 08/24/2010   Patient Active Problem List   Diagnosis Date Noted   DKA, type 1 ([place]) 03/13/2021   DKA (diabetic ketoacidosis) ([place]) 03/09/2021   Anemia 09/07/2013   Intractable vomiting 06/07/2012   Lung nodule seen on imaging study 06/07/2012   Dental abscess 01/19/2012   DKA (diabetic ketoacidoses) 10/26/2011   URI (upper respiratory infection) 10/26/2011   ALLERGIC RHINITIS 05/07/2010   IDDM 08/05/2009   Home Medication(s) Prior to Admission medications   Medication Sig Start Date End Date Taking? Authorizing Provider  blood glucose meter kit and supplies KIT Dispense based on patient and insurance preference. Use up to four times daily as directed. 03/14/21   Donne Hazel, MD  cetirizine (ZYRTEC) 10 MG tablet Take 10 mg by mouth at bedtime.    [provider]  Ferrous Sulfate (IRON PO) Take 1 tablet by mouth daily.    [provider]  ibuprofen (ADVIL) 600 MG tablet Take 1 tablet (600 mg total) by mouth every 6 (six) hours as needed for moderate pain. 02/17/21   Domenic Moras, PA-C  insulin NPH-regular Human (NOVOLIN 70/30 RELION) (70-30) 100 UNIT/ML injection Inject 35 Units into the skin 2 (two) times daily with a meal. 03/14/21   Donne Hazel, MD  nitrofurantoin, macrocrystal-monohydrate, (MACROBID) 100 MG capsule Take  1 capsule (100 mg total) by mouth 2 (two) times daily. 07/22/21   Maudie Flakes, MD                                                                                                                                    Allergies Peanut butter flavor, Penicillins, Banana, Orange concentrate [flavoring agent], Other, and Latex  Review of Systems Review of Systems As noted in HPI  Physical Exam Vital Signs  I have reviewed the triage vital signs BP 140/87 (BP Location: Right Arm)    Pulse 87    Temp 98.4 F (36.9 C) (Oral)    Resp 20    Ht _0  (1.702 m)    Wt 72.6 kg    LMP 08/03/2021    SpO2 100%    BMI 25.06 kg/m   Physical Exam Vitals reviewed.  Constitutional:      General: She is not in acute distress.  Appearance: She is well-developed. She is not diaphoretic.  HENT:     Head: Normocephalic and atraumatic.     Right Ear: External ear normal.     Left Ear: External ear normal.     Nose: Nose normal.  Eyes:     General: No scleral icterus.    Conjunctiva/sclera: Conjunctivae normal.  Neck:     Trachea: Phonation normal.  Cardiovascular:     Rate and Rhythm: Normal rate and regular rhythm.  Pulmonary:     Effort: Pulmonary effort is normal. No respiratory distress.     Breath sounds: No stridor.  Abdominal:     General: There is no distension.  Musculoskeletal:        General: Normal range of motion.       Hands:     Cervical back: Normal range of motion.  Neurological:     Mental Status: She is alert and oriented to person, place, and time.  Psychiatric:        Behavior: Behavior normal.    ED Results and Treatments Labs (all labs ordered are listed, but only abnormal results are displayed) Labs Reviewed - No data to display                                                                                                                       EKG  EKG Interpretation  Date/Time:    Ventricular Rate:    PR Interval:    QRS Duration:   QT Interval:    QTC  Calculation:   R Axis:     Text Interpretation:         Radiology No results found.  Pertinent labs & imaging results that were available during my care of the patient were reviewed by me and considered in my medical decision making (see MDM for details).  Medications Ordered in ED Medications - No data to display                                                                                                                                   Procedures .Suture Removal  Date/Time: 08/23/2021 8:55 AM Performed by: Fatima Blank, MD Authorized by: Fatima Blank, MD   Consent:    Consent obtained:  Verbal   Consent given by:  Patient   Risks discussed:  Bleeding and pain   Alternatives discussed:  Delayed treatment Universal protocol:  Patient identity confirmed:  Verbally with patient Location:    Location:  Upper extremity   Upper extremity location:  Hand   Hand location:  L index finger Procedure details:    Wound appearance:  No signs of infection   Number of sutures removed:  5 Post-procedure details:    Post-removal:  Dressing applied   Procedure completion:  Tolerated  (including critical care time)  Medical Decision Making / ED Course     Sutures removed. No evidence of associated infection requiring additional management. Routinely care recommended. Close monitoring for signs of infection recommended given her history of diabetes   Final Clinical Impression(s) / ED Diagnoses Final diagnoses:  Encounter for removal of sutures   The patient appears reasonably screened and/or stabilized for discharge and I doubt any other medical condition or other [facility] requiring further screening, evaluation, or treatment in the ED at this time prior to discharge. Safe for discharge with strict return precautions.  Disposition: Discharge  Condition: Good  I have discussed the results, Dx and Tx plan with the patient/family who expressed understanding  and agree(s) with the plan. Discharge instructions discussed at length. The patient/family was given strict return precautions who verbalized understanding of the instructions. No further questions at time of discharge.    ED Discharge Orders     None        Follow Up: Lucianne Lei, [address] [place] [id number] [phone]   as needed           This chart was dictated using voice recognition software.  Despite best efforts to proofread,  errors can occur which can change the documentation meaning.    Fatima Blank, MD 08/23/21 [phone]

## 2021-09-11 ENCOUNTER — Other Ambulatory Visit: Payer: Self-pay

## 2021-09-11 ENCOUNTER — Encounter (HOSPITAL_BASED_OUTPATIENT_CLINIC_OR_DEPARTMENT_OTHER): Payer: Self-pay

## 2021-09-11 ENCOUNTER — Emergency Department (HOSPITAL_BASED_OUTPATIENT_CLINIC_OR_DEPARTMENT_OTHER)
Admission: EM | Admit: 2021-09-11 | Discharge: 2021-09-11 | Disposition: A | Discharge status: Home / Self Care | Payer: Self-pay | Attending: Emergency Medicine | Admitting: Emergency Medicine

## 2021-09-11 DIAGNOSIS — Z91040 Latex allergy status: Secondary | ICD-10-CM | POA: Insufficient documentation

## 2021-09-11 DIAGNOSIS — E119 Type 2 diabetes mellitus without complications: Secondary | ICD-10-CM | POA: Insufficient documentation

## 2021-09-11 DIAGNOSIS — Z794 Long term (current) use of insulin: Secondary | ICD-10-CM | POA: Insufficient documentation

## 2021-09-11 DIAGNOSIS — K0889 Other specified disorders of teeth and supporting structures: Secondary | ICD-10-CM

## 2021-09-11 DIAGNOSIS — K029 Dental caries, unspecified: Secondary | ICD-10-CM | POA: Insufficient documentation

## 2021-09-11 MED ORDER — LIDOCAINE VISCOUS HCL 2 % MT SOLN: 15.0000 mL | OROMUCOSAL | 0 refills | Status: DC | PRN

## 2021-09-11 MED ORDER — DICLOFENAC POTASSIUM 50 MG PO TABS: 50.0000 mg | ORAL_TABLET | Freq: Three times a day (TID) | ORAL | 0 refills | Status: DC

## 2021-09-11 NOTE — ED Triage Notes (Signed)
Pt c/o left upper dental pain-states she was at dentist 1/19-started on abx

## 2021-09-11 NOTE — ED Provider Notes (Signed)
[facility] EMERGENCY DEPARTMENT Provider Note   CSN: [id number] Arrival date & time: 09/11/21  1408     History  Chief Complaint  Patient presents with   Dental Pain    Beverly Wells is a 31 y.o. female presenting for evaluation of dental pain.  Patient states she is having pain of her left upper tooth.  She saw a dentist on Monday, 5 days ago, and it was determined that the tooth needs to be pulled.  However she was placed on antibiotics first to treat any infection.  She has been taking the clindamycin as prescribed.  She was also given Tylenol 3's which she has been taking.  She reports initially the pain was improving, but now it is worsening again.  She is not taking anything else including ibuprofen.  She has a history of diabetes, states her blood sugars have been variable due to the infection, no nausea or vomiting.  No fevers.  She has a follow-up appointment with a dentist on Wednesday, in 5 days.  HPI     Home Medications Prior to Admission medications   Medication Sig Start Date End Date Taking? Authorizing Provider  diclofenac (CATAFLAM) 50 MG tablet Take 1 tablet (50 mg total) by mouth 3 (three) times daily. 09/11/21  Yes Shaya Altamura, PA-C  lidocaine (XYLOCAINE) 2 % solution Use as directed 15 mLs in the mouth or throat as needed for mouth pain. 09/11/21  Yes Azure Budnick, PA-C  blood glucose meter kit and supplies KIT Dispense based on patient and insurance preference. Use up to four times daily as directed. 03/14/21   Donne Hazel, MD  cetirizine (ZYRTEC) 10 MG tablet Take 10 mg by mouth at bedtime.    [provider]  Ferrous Sulfate (IRON PO) Take 1 tablet by mouth daily.    [provider]  ibuprofen (ADVIL) 600 MG tablet Take 1 tablet (600 mg total) by mouth every 6 (six) hours as needed for moderate pain. 02/17/21   Domenic Moras, PA-C  insulin NPH-regular Human (NOVOLIN 70/30 RELION) (70-30) 100 UNIT/ML injection  Inject 35 Units into the skin 2 (two) times daily with a meal. 03/14/21   Donne Hazel, MD  nitrofurantoin, macrocrystal-monohydrate, (MACROBID) 100 MG capsule Take 1 capsule (100 mg total) by mouth 2 (two) times daily. 07/22/21   Maudie Flakes, MD      Allergies    Peanut butter flavor, Penicillins, Banana, Orange concentrate [flavoring agent], Other, and Latex    Review of Systems   Review of Systems  Constitutional:  Negative for fever.  HENT:  Positive for dental problem.    Physical Exam Updated Vital Signs BP (!) 151/95 (BP Location: Left Arm)    Pulse (!) 106    Temp 98.5 F (36.9 C) (Oral)    Resp 18    LMP 09/11/2021    SpO2 98%  Physical Exam Vitals and nursing note reviewed.  Constitutional:      General: She is not in acute distress.    Appearance: She is well-developed.  HENT:     Head: Normocephalic and atraumatic.     Mouth/Throat:     Dentition: Abnormal dentition. Dental tenderness present.      Comments: Left upper tooth chipped and tender.  No surrounding gum edema or erythema.  No abscess. Left lower tooth with significant decay Eyes:     Extraocular Movements: Extraocular movements intact.  Cardiovascular:     Rate and Rhythm: Normal rate.  Pulmonary:     Effort: Pulmonary effort is normal.  Abdominal:     General: There is no distension.  Musculoskeletal:        General: Normal range of motion.     Cervical back: Normal range of motion.  Skin:    General: Skin is warm.     Findings: No rash.  Neurological:     Mental Status: She is alert and oriented to person, place, and time.    ED Results / Procedures / Treatments   Labs (all labs ordered are listed, but only abnormal results are displayed) Labs Reviewed - No data to display  EKG None  Radiology No results found.  Procedures Procedures    Medications Ordered in ED Medications - No data to display  ED Course/ Medical Decision Making/ A&P                           Medical  Decision Making Risk Prescription drug management.    This patient presents to the ED for concern of dental pain. This involves a number of treatment options, and is a complaint that carries with it a low risk of complications and morbidity.  The differential diagnosis includes dental nerve exposure, infection, abscess   Co morbidities:  DM   Test Considered:  No significant swelling.  No signs of abscess.  No evidence of deep space infection that would require CT.   Disposition:  After consideration of the diagnostic results and the patients response to treatment, I feel that the patent would benefit from continued pain control, continued antibiotic use, and follow-up with dentistry.  On exam, tooth is chipped and tender, but there are no signs of infection of the left upper tooth.  As such, clindamycin likely has taken care of any infection.  She likely continues to have pain because the tooth is chipped and there may be some nerve exposure.  This will need to be managed with dentistry.  Discussed with patient.  Discussed symptomatic management with lidocaine, NSAIDs, and patient to continue the Tylenol threes.  Encouraged her to follow-up with her dentist sooner if able for further pain control.  At this time, patient appears safe for discharge.  Return precautions given.  Patient states she understands and agrees to plan   Final Clinical Impression(s) / ED Diagnoses Final diagnoses:  None    Rx / DC Orders ED Discharge Orders          Ordered    lidocaine (XYLOCAINE) 2 % solution  As needed        09/11/21 1500    diclofenac (CATAFLAM) 50 MG tablet  3 times daily        09/11/21 1500              Franchot Heidelberg, PA-C 09/11/21 1522    LongWonda Olds, MD 09/17/21 1622

## 2021-09-11 NOTE — Discharge Instructions (Signed)
Take diclofenac 3 times a day with meals.  Do not take other anti-inflammatories at the same time (ibuprofen, Advil, Motrin, naproxen, Aleve).  Use the numbing medicine to help with pain control Continue taking the Tylenol 3's that was prescribed to you. Continue the antibiotics that were prescribed to you. Call your dentist today to tell them about your worsened pain and see if they can get you in sooner. Return to the emergency room with any new, worsening, concerning symptoms.

## 2021-10-08 ENCOUNTER — Encounter (HOSPITAL_BASED_OUTPATIENT_CLINIC_OR_DEPARTMENT_OTHER): Payer: Self-pay

## 2021-10-08 ENCOUNTER — Other Ambulatory Visit: Payer: Self-pay

## 2021-10-08 ENCOUNTER — Emergency Department (HOSPITAL_BASED_OUTPATIENT_CLINIC_OR_DEPARTMENT_OTHER)
Admission: EM | Admit: 2021-10-08 | Discharge: 2021-10-08 | Disposition: A | Discharge status: Home / Self Care | Payer: Self-pay | Attending: Emergency Medicine | Admitting: Emergency Medicine

## 2021-10-08 DIAGNOSIS — N390 Urinary tract infection, site not specified: Secondary | ICD-10-CM

## 2021-10-08 DIAGNOSIS — Z794 Long term (current) use of insulin: Secondary | ICD-10-CM | POA: Insufficient documentation

## 2021-10-08 DIAGNOSIS — E109 Type 1 diabetes mellitus without complications: Secondary | ICD-10-CM | POA: Insufficient documentation

## 2021-10-08 DIAGNOSIS — E860 Dehydration: Secondary | ICD-10-CM

## 2021-10-08 DIAGNOSIS — Z91040 Latex allergy status: Secondary | ICD-10-CM | POA: Insufficient documentation

## 2021-10-08 DIAGNOSIS — N9489 Other specified conditions associated with female genital organs and menstrual cycle: Secondary | ICD-10-CM | POA: Insufficient documentation

## 2021-10-08 DIAGNOSIS — R1110 Vomiting, unspecified: Secondary | ICD-10-CM | POA: Insufficient documentation

## 2021-10-08 DIAGNOSIS — R1030 Lower abdominal pain, unspecified: Secondary | ICD-10-CM | POA: Insufficient documentation

## 2021-10-08 DIAGNOSIS — Z91010 Allergy to peanuts: Secondary | ICD-10-CM | POA: Insufficient documentation

## 2021-10-08 LAB — COMPREHENSIVE METABOLIC PANEL
ALT: 10 U/L (ref 0–44)
AST: 17 U/L (ref 15–41)
Albumin: 3.9 g/dL (ref 3.5–5.0)
Alkaline Phosphatase: 75 U/L (ref 38–126)
Anion gap: 13 (ref 5–15)
BUN: 11 mg/dL (ref 6–20)
CO2: 21 mmol/L — ABNORMAL LOW (ref 22–32)
Calcium: 9.4 mg/dL (ref 8.9–10.3)
Chloride: 101 mmol/L (ref 98–111)
Creatinine, Ser: 0.76 mg/dL (ref 0.44–1.00)
GFR, Estimated: >60 mL/min (ref >60)
Glucose, Bld: 302 mg/dL — ABNORMAL HIGH (ref 70–99)
Potassium: 3.8 mmol/L (ref 3.5–5.1)
Sodium: 135 mmol/L (ref 135–145)
Total Bilirubin: 0.6 mg/dL (ref 0.3–1.2)
Total Protein: 7.5 g/dL (ref 6.5–8.1)

## 2021-10-08 LAB — URINALYSIS, ROUTINE W REFLEX MICROSCOPIC
Bilirubin Urine: NEGATIVE
Glucose, UA: >1000 mg/dL — AB
Ketones, ur: 15 mg/dL — AB
Nitrite: NEGATIVE
Protein, ur: 100 mg/dL — AB
RBC / HPF: >50 RBC/hpf — ABNORMAL HIGH (ref 0–5)
Specific Gravity, Urine: 1.032 — ABNORMAL HIGH (ref 1.005–1.030)
pH: 6 (ref 5.0–8.0)

## 2021-10-08 LAB — CBC
HCT: 35.1 % — ABNORMAL LOW (ref 36.0–46.0)
Hemoglobin: 10.9 g/dL — ABNORMAL LOW (ref 12.0–15.0)
MCH: 22.6 pg — ABNORMAL LOW (ref 26.0–34.0)
MCHC: 31.1 g/dL (ref 30.0–36.0)
MCV: 72.8 fL — ABNORMAL LOW (ref 80.0–100.0)
Platelets: 304 10*3/uL (ref 150–400)
RBC: 4.82 MIL/uL (ref 3.87–5.11)
RDW: 14.9 % (ref 11.5–15.5)
WBC: 7.4 10*3/uL (ref 4.0–10.5)
nRBC: 0 % (ref 0.0–0.2)

## 2021-10-08 LAB — I-STAT VENOUS BLOOD GAS, ED
Acid-Base Excess: 2 mmol/L (ref 0.0–2.0)
Bicarbonate: 25.2 mmol/L (ref 20.0–28.0)
Calcium, Ion: 1.15 mmol/L (ref 1.15–1.40)
HCT: 37 % (ref 36.0–46.0)
Hemoglobin: 12.6 g/dL (ref 12.0–15.0)
O2 Saturation: 84 %
Potassium: 4.1 mmol/L (ref 3.5–5.1)
Sodium: 136 mmol/L (ref 135–145)
TCO2: 26 mmol/L (ref 22–32)
pCO2, Ven: 35.9 mmHg — ABNORMAL LOW (ref 44–60)
pH, Ven: 7.455 — ABNORMAL HIGH (ref 7.25–7.43)
pO2, Ven: 46 mmHg — ABNORMAL HIGH (ref 32–45)

## 2021-10-08 LAB — CBG MONITORING, ED
Glucose-Capillary: 362 mg/dL — ABNORMAL HIGH (ref 70–99)
Glucose-Capillary: 208 mg/dL — ABNORMAL HIGH (ref 70–99)

## 2021-10-08 LAB — LIPASE, BLOOD: Lipase: <10 U/L — ABNORMAL LOW (ref 11–51)

## 2021-10-08 LAB — PREGNANCY, URINE: Preg Test, Ur: NEGATIVE

## 2021-10-08 MED ORDER — KETOROLAC TROMETHAMINE 30 MG/ML IJ SOLN
30.0000 mg | Freq: Once | INTRAMUSCULAR | Status: AC
  Administered 2021-10-08: 30 mg via INTRAVENOUS
  Filled 2021-10-08: qty 1

## 2021-10-08 MED ORDER — LACTATED RINGERS IV SOLN: INTRAVENOUS | Status: DC

## 2021-10-08 MED ORDER — METOCLOPRAMIDE HCL 5 MG/ML IJ SOLN
10.0000 mg | Freq: Once | INTRAMUSCULAR | Status: AC
  Administered 2021-10-08: 10 mg via INTRAVENOUS
  Filled 2021-10-08: qty 2

## 2021-10-08 MED ORDER — SULFAMETHOXAZOLE-TRIMETHOPRIM 800-160 MG PO TABS: 1.0000 | ORAL_TABLET | Freq: Two times a day (BID) | ORAL | 0 refills | Status: AC

## 2021-10-08 MED ORDER — LACTATED RINGERS IV BOLUS
2000.0000 mL | Freq: Once | INTRAVENOUS | Status: AC
  Administered 2021-10-08: 2000 mL via INTRAVENOUS

## 2021-10-08 NOTE — ED Triage Notes (Signed)
Pt came in POV c/o of Abd Pain with N/V/D for the past two days. Pt did start her menstruation cycle two days ago as well. Pt thinks she has vomited three times today and 1 episode of diarrhea. Pt denies any meds for treatment of symptoms.

## 2021-10-08 NOTE — ED Provider Notes (Addendum)
°[facility] EMERGENCY DEPT °Provider Note ° ° °CSN: [id number] °Arrival date & time: 10/08/21  1202 ° °  ° °History ° °Chief Complaint  °Patient presents with  ° Abdominal Pain  ° Vomiting  ° ° °Beverly Wells is a 31 y.o. female. ° °31-year-old female with history of type 1 diabetes presents with 2 days of emesis characterized as nonbilious or bloody.  Patient states that she started menstrual cycle 2 days ago and she usually gets this when her period begins.  Denies any fever or chills.  No severe watery diarrhea.  No URI symptoms.  Describes lower abdominal cramping without urinary symptoms.  No treatment use prior to arrival ° ° °  ° °Home Medications °Prior to Admission medications   °Medication Sig Start Date End Date Taking? Authorizing Provider  °blood glucose meter kit and supplies KIT Dispense based on patient and insurance preference. Use up to four times daily as directed. 03/14/21   Chiu, Stephen K, MD  °cetirizine (ZYRTEC) 10 MG tablet Take 10 mg by mouth at bedtime.    [provider]  °diclofenac (CATAFLAM) 50 MG tablet Take 1 tablet (50 mg total) by mouth 3 (three) times daily. 09/11/21   Caccavale, Sophia, PA-C  °Ferrous Sulfate (IRON PO) Take 1 tablet by mouth daily.    [provider]  °ibuprofen (ADVIL) 600 MG tablet Take 1 tablet (600 mg total) by mouth every 6 (six) hours as needed for moderate pain. 02/17/21   Tran, Bowie, PA-C  °insulin NPH-regular Human (NOVOLIN 70/30 RELION) (70-30) 100 UNIT/ML injection Inject 35 Units into the skin 2 (two) times daily with a meal. 03/14/21   Chiu, Stephen K, MD  °lidocaine (XYLOCAINE) 2 % solution Use as directed 15 mLs in the mouth or throat as needed for mouth pain. 09/11/21   Caccavale, Sophia, PA-C  °nitrofurantoin, macrocrystal-monohydrate, (MACROBID) 100 MG capsule Take 1 capsule (100 mg total) by mouth 2 (two) times daily. 07/22/21   Bero, Michael M, MD  °   ° °Allergies    °Peanut butter flavor, Penicillins,  Banana, Orange concentrate [flavoring agent], Other, and Latex   ° °Review of Systems   °Review of Systems  °All other systems reviewed and are negative. ° °Physical Exam °Updated Vital Signs °BP (!) 130/98    Pulse (!) 102    Temp 97.7 °F (36.5 °C)    Resp 18    Ht 1.702 m (5' 7")    Wt 74.8 kg    LMP 09/11/2021    SpO2 99%    BMI 25.84 kg/m²  °Physical Exam °Vitals and nursing note reviewed.  °Constitutional:   °   General: She is not in acute distress. °   Appearance: Normal appearance. She is well-developed. She is not toxic-appearing.  °HENT:  °   Head: Normocephalic and atraumatic.  °Eyes:  °   General: Lids are normal.  °   Conjunctiva/sclera: Conjunctivae normal.  °   Pupils: Pupils are equal, round, and reactive to light.  °Neck:  °   Thyroid: No thyroid mass.  °   Trachea: No tracheal deviation.  °Cardiovascular:  °   Rate and Rhythm: Normal rate and regular rhythm.  °   Heart sounds: Normal heart sounds. No murmur heard. °  No gallop.  °Pulmonary:  °   Effort: Pulmonary effort is normal. No respiratory distress.  °   Breath sounds: Normal breath sounds. No stridor. No decreased breath sounds, wheezing, rhonchi or rales.  °Abdominal:  °     General: There is no distension.     Palpations: Abdomen is soft.     Tenderness: There is no abdominal tenderness. There is no guarding or rebound.  Musculoskeletal:        General: No tenderness. Normal range of motion.     Cervical back: Normal range of motion and neck supple.  Skin:    General: Skin is warm and dry.     Findings: No abrasion or rash.  Neurological:     Mental Status: She is alert and oriented to person, place, and time. Mental status is at baseline.     GCS: GCS eye subscore is 4. GCS verbal subscore is 5. GCS motor subscore is 6.     Cranial Nerves: No cranial nerve deficit.     Sensory: No sensory deficit.     Motor: Motor function is intact.  Psychiatric:        Attention and Perception: Attention normal.        Speech: Speech  normal.        Behavior: Behavior normal.    ED Results / Procedures / Treatments   Labs (all labs ordered are listed, but only abnormal results are displayed) Labs Reviewed  CBG MONITORING, ED - Abnormal; Notable for the following components:      Result Value   Glucose-Capillary 362 (*)    All other components within normal limits  LIPASE, BLOOD  COMPREHENSIVE METABOLIC PANEL  CBC  URINALYSIS, ROUTINE W REFLEX MICROSCOPIC  PREGNANCY, URINE  CBG MONITORING, ED    EKG None  Radiology No results found.  Procedures Procedures    Medications Ordered in ED Medications  lactated ringers infusion (has no administration in time range)  lactated ringers bolus 2,000 mL (has no administration in time range)  metoCLOPramide (REGLAN) injection 10 mg (has no administration in time range)    ED Course/ Medical Decision Making/ A&P                           Medical Decision Making Amount and/or Complexity of Data Reviewed Labs: ordered.  Risk Prescription drug management.   Patient presented with uterine cramping consistent when she has her menstrual cycle.  Had associated emesis likely due to the pain.  Concern for possibility of DKA.  That has been ruled out here and she has no increased anion gap.  Patient is urinalysis shows some infection pregnancy test is negative.  Patient's VBG does not show any severe metabolic acidosis.  Blood sugar was elevated and treated with IV fluids and repeated and is greatly improved.  Feel that patient has any acute intra-abdominal process.  Will discharge at this time        Final Clinical Impression(s) / ED Diagnoses Final diagnoses:  None    Rx / DC Orders ED Discharge Orders     None         Lacretia Leigh, MD 10/08/21 1429    Lacretia Leigh, MD 10/08/21 1459

## 2021-12-24 ENCOUNTER — Emergency Department (HOSPITAL_COMMUNITY)

## 2021-12-24 ENCOUNTER — Observation Stay (HOSPITAL_COMMUNITY)
Admission: EM | Admit: 2021-12-24 | Discharge: 2021-12-25 | Disposition: A | Discharge status: Home / Self Care | Attending: Internal Medicine | Admitting: Internal Medicine

## 2021-12-24 ENCOUNTER — Other Ambulatory Visit: Payer: Self-pay

## 2021-12-24 ENCOUNTER — Encounter (HOSPITAL_COMMUNITY): Payer: Self-pay | Admitting: *Deleted

## 2021-12-24 DIAGNOSIS — N179 Acute kidney failure, unspecified: Secondary | ICD-10-CM | POA: Diagnosis not present

## 2021-12-24 DIAGNOSIS — Z91040 Latex allergy status: Secondary | ICD-10-CM | POA: Diagnosis not present

## 2021-12-24 DIAGNOSIS — D509 Iron deficiency anemia, unspecified: Secondary | ICD-10-CM | POA: Diagnosis present

## 2021-12-24 DIAGNOSIS — E1010 Type 1 diabetes mellitus with ketoacidosis without coma: Principal | ICD-10-CM | POA: Insufficient documentation

## 2021-12-24 DIAGNOSIS — Z91010 Allergy to peanuts: Secondary | ICD-10-CM | POA: Insufficient documentation

## 2021-12-24 DIAGNOSIS — R1110 Vomiting, unspecified: Secondary | ICD-10-CM | POA: Diagnosis present

## 2021-12-24 DIAGNOSIS — D649 Anemia, unspecified: Secondary | ICD-10-CM | POA: Diagnosis not present

## 2021-12-24 DIAGNOSIS — Z794 Long term (current) use of insulin: Secondary | ICD-10-CM | POA: Insufficient documentation

## 2021-12-24 LAB — BASIC METABOLIC PANEL
Anion gap: 24 — ABNORMAL HIGH (ref 5–15)
Anion gap: 15 (ref 5–15)
Anion gap: 11 (ref 5–15)
Anion gap: 10 (ref 5–15)
BUN: 14 mg/dL (ref 6–20)
BUN: 14 mg/dL (ref 6–20)
BUN: 15 mg/dL (ref 6–20)
BUN: 15 mg/dL (ref 6–20)
CO2: 13 mmol/L — ABNORMAL LOW (ref 22–32)
CO2: 19 mmol/L — ABNORMAL LOW (ref 22–32)
CO2: 20 mmol/L — ABNORMAL LOW (ref 22–32)
CO2: 20 mmol/L — ABNORMAL LOW (ref 22–32)
Calcium: 10.1 mg/dL (ref 8.9–10.3)
Calcium: 9.5 mg/dL (ref 8.9–10.3)
Calcium: 9.6 mg/dL (ref 8.9–10.3)
Calcium: 8.8 mg/dL — ABNORMAL LOW (ref 8.9–10.3)
Chloride: 96 mmol/L — ABNORMAL LOW (ref 98–111)
Chloride: 106 mmol/L (ref 98–111)
Chloride: 109 mmol/L (ref 98–111)
Chloride: 106 mmol/L (ref 98–111)
Creatinine, Ser: 1.66 mg/dL — ABNORMAL HIGH (ref 0.44–1.00)
Creatinine, Ser: 1.41 mg/dL — ABNORMAL HIGH (ref 0.44–1.00)
Creatinine, Ser: 1.14 mg/dL — ABNORMAL HIGH (ref 0.44–1.00)
Creatinine, Ser: 1.12 mg/dL — ABNORMAL HIGH (ref 0.44–1.00)
GFR, Estimated: 43 mL/min — ABNORMAL LOW (ref >60)
GFR, Estimated: 52 mL/min — ABNORMAL LOW (ref >60)
GFR, Estimated: >60 mL/min (ref >60)
GFR, Estimated: >60 mL/min (ref >60)
Glucose, Bld: 622 mg/dL (ref 70–99)
Glucose, Bld: 293 mg/dL — ABNORMAL HIGH (ref 70–99)
Glucose, Bld: 161 mg/dL — ABNORMAL HIGH (ref 70–99)
Glucose, Bld: 261 mg/dL — ABNORMAL HIGH (ref 70–99)
Potassium: 5.4 mmol/L — ABNORMAL HIGH (ref 3.5–5.1)
Potassium: 4 mmol/L (ref 3.5–5.1)
Potassium: 4.3 mmol/L (ref 3.5–5.1)
Potassium: 3.9 mmol/L (ref 3.5–5.1)
Sodium: 133 mmol/L — ABNORMAL LOW (ref 135–145)
Sodium: 140 mmol/L (ref 135–145)
Sodium: 140 mmol/L (ref 135–145)
Sodium: 136 mmol/L (ref 135–145)

## 2021-12-24 LAB — CBC WITH DIFFERENTIAL/PLATELET
Abs Immature Granulocytes: 0.07 10*3/uL (ref 0.00–0.07)
Basophils Absolute: 0 10*3/uL (ref 0.0–0.1)
Basophils Relative: 0 %
Eosinophils Absolute: 0 10*3/uL (ref 0.0–0.5)
Eosinophils Relative: 0 %
HCT: 39.5 % (ref 36.0–46.0)
Hemoglobin: 12.1 g/dL (ref 12.0–15.0)
Immature Granulocytes: 1 %
Lymphocytes Relative: 4 %
Lymphs Abs: 0.6 10*3/uL — ABNORMAL LOW (ref 0.7–4.0)
MCH: 23.1 pg — ABNORMAL LOW (ref 26.0–34.0)
MCHC: 30.6 g/dL (ref 30.0–36.0)
MCV: 75.4 fL — ABNORMAL LOW (ref 80.0–100.0)
Monocytes Absolute: 0.2 10*3/uL (ref 0.1–1.0)
Monocytes Relative: 1 %
Neutro Abs: 13.5 10*3/uL — ABNORMAL HIGH (ref 1.7–7.7)
Neutrophils Relative %: 94 %
Platelets: 343 10*3/uL (ref 150–400)
RBC: 5.24 MIL/uL — ABNORMAL HIGH (ref 3.87–5.11)
RDW: 13.6 % (ref 11.5–15.5)
WBC: 14.4 10*3/uL — ABNORMAL HIGH (ref 4.0–10.5)
nRBC: 0 % (ref 0.0–0.2)

## 2021-12-24 LAB — I-STAT BETA HCG BLOOD, ED (MC, WL, AP ONLY): I-stat hCG, quantitative: <5 m[IU]/mL (ref <5)

## 2021-12-24 LAB — I-STAT VENOUS BLOOD GAS, ED
Acid-base deficit: 12 mmol/L — ABNORMAL HIGH (ref 0.0–2.0)
Bicarbonate: 16.5 mmol/L — ABNORMAL LOW (ref 20.0–28.0)
Calcium, Ion: 1.26 mmol/L (ref 1.15–1.40)
HCT: 42 % (ref 36.0–46.0)
Hemoglobin: 14.3 g/dL (ref 12.0–15.0)
O2 Saturation: 81 %
Potassium: 5.2 mmol/L — ABNORMAL HIGH (ref 3.5–5.1)
Sodium: 133 mmol/L — ABNORMAL LOW (ref 135–145)
TCO2: 18 mmol/L — ABNORMAL LOW (ref 22–32)
pCO2, Ven: 44.6 mmHg (ref 44–60)
pH, Ven: 7.177 — CL (ref 7.25–7.43)
pO2, Ven: 57 mmHg — ABNORMAL HIGH (ref 32–45)

## 2021-12-24 LAB — GLUCOSE, CAPILLARY
Glucose-Capillary: 152 mg/dL — ABNORMAL HIGH (ref 70–99)
Glucose-Capillary: 250 mg/dL — ABNORMAL HIGH (ref 70–99)
Glucose-Capillary: 227 mg/dL — ABNORMAL HIGH (ref 70–99)
Glucose-Capillary: 316 mg/dL — ABNORMAL HIGH (ref 70–99)
Glucose-Capillary: 156 mg/dL — ABNORMAL HIGH (ref 70–99)

## 2021-12-24 LAB — CBG MONITORING, ED
Glucose-Capillary: >600 mg/dL (ref 70–99)
Glucose-Capillary: 521 mg/dL (ref 70–99)
Glucose-Capillary: 361 mg/dL — ABNORMAL HIGH (ref 70–99)
Glucose-Capillary: 296 mg/dL — ABNORMAL HIGH (ref 70–99)
Glucose-Capillary: 186 mg/dL — ABNORMAL HIGH (ref 70–99)
Glucose-Capillary: 180 mg/dL — ABNORMAL HIGH (ref 70–99)
Glucose-Capillary: 177 mg/dL — ABNORMAL HIGH (ref 70–99)

## 2021-12-24 LAB — BETA-HYDROXYBUTYRIC ACID
Beta-Hydroxybutyric Acid: 7.88 mmol/L — ABNORMAL HIGH (ref 0.05–0.27)
Beta-Hydroxybutyric Acid: 3.35 mmol/L — ABNORMAL HIGH (ref 0.05–0.27)

## 2021-12-24 LAB — HEMOGLOBIN A1C
Hgb A1c MFr Bld: 10.7 % — ABNORMAL HIGH (ref 4.8–5.6)
Mean Plasma Glucose: 260.39 mg/dL

## 2021-12-24 MED ORDER — PROMETHAZINE HCL 25 MG/ML IJ SOLN
25.0000 mg | Freq: Once | INTRAMUSCULAR | Status: AC
  Administered 2021-12-24: 25 mg via INTRAMUSCULAR
  Filled 2021-12-24: qty 1

## 2021-12-24 MED ORDER — ACETAMINOPHEN 650 MG RE SUPP: 650.0000 mg | Freq: Four times a day (QID) | RECTAL | Status: DC | PRN

## 2021-12-24 MED ORDER — DEXTROSE IN LACTATED RINGERS 5 % IV SOLN: INTRAVENOUS | Status: DC

## 2021-12-24 MED ORDER — ONDANSETRON HCL 4 MG/2ML IJ SOLN: 4.0000 mg | Freq: Four times a day (QID) | INTRAMUSCULAR | Status: DC | PRN

## 2021-12-24 MED ORDER — ENOXAPARIN SODIUM 40 MG/0.4ML IJ SOSY: 40.0000 mg | PREFILLED_SYRINGE | INTRAMUSCULAR | Status: DC

## 2021-12-24 MED ORDER — ACETAMINOPHEN 325 MG PO TABS
650.0000 mg | ORAL_TABLET | Freq: Four times a day (QID) | ORAL | Status: DC | PRN
  Administered 2021-12-25: 650 mg via ORAL
  Filled 2021-12-24: qty 2

## 2021-12-24 MED ORDER — LACTATED RINGERS IV SOLN: INTRAVENOUS | Status: DC

## 2021-12-24 MED ORDER — ONDANSETRON 4 MG PO TBDP
4.0000 mg | ORAL_TABLET | Freq: Once | ORAL | Status: AC
  Administered 2021-12-24: 4 mg via ORAL
  Filled 2021-12-24: qty 1

## 2021-12-24 MED ORDER — LACTATED RINGERS IV BOLUS
20.0000 mL/kg | Freq: Once | INTRAVENOUS | Status: AC
  Administered 2021-12-24: 1000 mL via INTRAVENOUS

## 2021-12-24 MED ORDER — ENOXAPARIN SODIUM 40 MG/0.4ML IJ SOSY
40.0000 mg | PREFILLED_SYRINGE | INTRAMUSCULAR | Status: DC
  Filled 2021-12-24: qty 0.4

## 2021-12-24 MED ORDER — ONDANSETRON HCL 4 MG PO TABS: 4.0000 mg | ORAL_TABLET | Freq: Four times a day (QID) | ORAL | Status: DC | PRN

## 2021-12-24 MED ORDER — POTASSIUM CHLORIDE 10 MEQ/100ML IV SOLN
10.0000 meq | INTRAVENOUS | Status: AC
  Administered 2021-12-24 (×2): 10 meq via INTRAVENOUS
  Filled 2021-12-24 (×2): qty 100

## 2021-12-24 MED ORDER — DEXTROSE 50 % IV SOLN: 0.0000 mL | INTRAVENOUS | Status: DC | PRN

## 2021-12-24 MED ORDER — INSULIN REGULAR(HUMAN) IN NACL 100-0.9 UT/100ML-% IV SOLN
INTRAVENOUS | Status: DC
  Administered 2021-12-24: 4.8 [IU]/h via INTRAVENOUS
  Administered 2021-12-24: 0.5 [IU]/h via INTRAVENOUS
  Filled 2021-12-24 (×2): qty 100

## 2021-12-24 NOTE — H&P (Addendum)
? ? ? ?Date: 12/24/2021     ?     ?     ?Patient Name:  Beverly Wells MRN: [id number]  ?DOB: [birth year] Age / Sex: 31 y.o., female   ?PCP: Pcp, No    ?     ?Medical Service: Internal Medicine Teaching Service    ?     ?Attending Physician: Dr. Mikey Bussing, Marthenia Rolling, DO    ?First Contact: Dr. Carlyn Reichert, MD  Pager: [phone]  ?Second Contact: Dr. Marolyn Haller, MD Pager: [phone]  ?     ?After Hours (After 5p/  First Contact Pager: [phone]  ?weekends / holidays): Second Contact Pager: [phone]  ? ?Chief Complaint: DKA ? ?History of Present Illness:  ? ?Beverly Wells a 31 year old female with a past medical history significant for type 1 diabetes that is poorly controlled in the setting of intermittent inability to afford her medications. ? ?Patient states that she is currently at a correctional facility and [facility].  For unclear reasons she was an a facility in [facility] 2 days before the day of admission.  She states that she woke up early at 8 AM and was unable to get her usual dose of insulin which is 20 units of NPH in the a.m. and 20 units of NPH in the afternoon.  She also has sliding scale insulin that she takes during this time.  Patient states that when she arrived back to [facility] she did not get her next dose of insulin until approximately 4 AM on the day of admission.  ? ?By this time patient states that she had developed nausea, vomiting, and abdominal pain.  Her urine ketones were checked and were noted to be elevated.  She was given 20 units of regular insulin and 15 units of NPH.  She continued to be symptomatic and was subsequently transferred to [facility] for further management.   ? ? ?She denies any fevers, chills, chest pain, diarrhea.   ? ? ?Of note, patient states that she was diagnosed with type 1 diabetes at the age of 37.  At that time she followed with a endocrinologist at [facility] but subsequently transferred her care to to [facility].  However, unfortunately after the age of 19 she states that her insurance has relapsed intermittently and she does not have regular care.  As a result she has 1-2 hospitalizations for diabetic ketoacidosis a year since that time.   ? ?She most recently was hospitalized 04/2021 at [facility] that time she required overnight admission for diabetic ketoacidosis.  Her symptoms quickly resolved at that time.   ? ?When evaluated at the bedside, patient denies further episodes of emesis, has some mild nausea and diffuse abdominal pain. ? ? ?PMH: ?Type 1 diabetes, poorly controlled last A1c greater than 14 (01/06/2021) ?Eyes last checked in 2022, per patient report no evidence of diabetic retinopathy ? ?Medications: ?NPH 20 units twice daily plus sliding scale insulin with each dose ? ? ?Allergies (PCN, tramadol, medicinen from [facility] (got medicine for nausea or anxiety just hives.  ? ? ?PSH:  ?None ? ?SH:  ?No current tobacco use ?No alcohol use ?Lives wife in [place], currently housed in a correctional facility ? ?FH:  ?Mom healthy ?Dad healthy ?Parents live in [place], [place] ? ?Meds:  ?Current Meds  ?Medication Sig  ? cetirizine (ZYRTEC) 10 MG tablet Take 10 mg by mouth at bedtime.  ? insulin NPH Human (NOVOLIN N)  100 UNIT/ML injection Inject 20 Units into the skin 2 (two) times daily.  ? insulin regular (NOVOLIN R) 100 units/mL injection Inject 2-14 Units into the skin 3 (three) times daily before meals. Per sliding scale: not provided  ? ? ? ?Allergies: ?Allergies as of 12/24/2021 - Review Complete 12/24/2021  ?Allergen Reaction Noted  ? Peanut butter flavor Anaphylaxis 02/09/2016  ? Penicillins Anaphylaxis, Hives, and Swelling 10/14/2012  ? Banana Hives 04/22/2012  ? Orange concentrate [employer] agent] Hives 04/22/2012  ? Other Hives 03/09/2021  ? Latex Rash 03/13/2021  ? ?Past Medical History:  ?Diagnosis Date  ? ALLERGIC RHINITIS 05/07/2010  ? Diabetes mellitus  without complication (HCC)   ? DKA (diabetic ketoacidoses)   ? IDDM 08/05/2009  ? PERS HX NONCOMPLIANCE W/MED TX PRS HAZARDS HLTH 08/24/2010  ? ? ? ?Review of Systems: ?A complete ROS was negative except as per HPI.  ? ?Physical Exam: ?Blood pressure 104/72, pulse 100, temperature 98.6 ?F (37 ?C), temperature source Oral, resp. rate 14, height 5\' 7"  (1.702 m), weight 72.6 kg, SpO2 100 %. ? ?Constitutional: Well-developed, well-nourished, and in no distress.  ?HENT:  ?Head: Normocephalic and atraumatic.  ?Eyes: EOM are normal.  ?Neck: Normal range of motion.  ?Cardiovascular: Normal rate, regular rhythm, intact distal pulses. No gallop and no friction rub.  ?No murmur heard. No lower extremity edema  ?Pulmonary: Non labored breathing on room air, no wheezing or rales  ?Abdominal: Soft. Normal bowel sounds. Non distended and mildly tender diffusely  ?Musculoskeletal: Normal range of motion.     ?   General: No tenderness or edema.  ?Neurological: Alert and oriented to person, place, and time. Non focal  ?Skin: Skin is warm and dry.  ? ? ?EKG: personally reviewed my interpretation is normal sinus rhythm  ? ?CXR: personally reviewed my interpretation is no evidence of acute pulmonary disease.  ? ?Assessment & Plan by Problem: ?Principal Problem: ?  DKA (diabetic ketoacidosis) (HCC) ? ?#DKA ?#T1DM ?Patient presented with N/V and abdominal pain. She was found to have K 5.4, AG 24, bicarb 13, elevated beta hydroxybutyrate, and venous pH 7.17 c/f DKA in the setting of withheld doses of her insulin regimen. She was started on endotool and IV fluids. Her symptoms are rapidly improving.  ? ?Her A1c was last checked 02/2021 and it was >15.5. She reports difficulty affording her medications due to lapses in insurance. She also does not have a PCP unless she is housed in a correctional facility. Her current antidiabetes regimen consists of NPH 20u BID +SSI ?-Will continue endotool and IV fluids per DKA protocol and transition to  subq when appropriate ?-BMP q4h, replete potassium  ?-BHB q8h ?-F/u hgb A1c ?-CLD  ?-C/s diabetes coordinator to help formulate diabetic plan that patient can have while she is currently at the correctional facility ?-Give patient information for [facility] when she returns home  ? ?#Likely AKI ?Patient's sCr elevated to 1.66 in the setting of N/V and polyuria. 2/23 her sCr was 0.76.  ?-Will continue with IV fluids and follow AM BMP ? ?#Leukocytosis  ?Patient without any systemic signs of infection including no fever. Perhaps this is reactive or from her being hemoconcentrated. She has had some N/V but this is likely related to her being in DKA. Will continue to monitor and check AM CBC.  ?-Hold on antibiotics or further work up for infection ? ?#Microcytosis  ?Patient with normal Hgb and microcytosis on AM labs. Her normal Hgb is likely in the setting of  hypovolemia. 2/23 her Hgb was 10.9. She is a young woman and this could be related to her menses.  ?-Will check Iron levels and replete as appropriate ? ? ?Dispo: Admit patient to Observation with expected length of stay less than 2 midnights. ? ?Signed: ?Marolyn Hallerarter, Finas Delone, MD ?12/24/2021, 5:55 PM  ?After 5pm on weekdays and 1pm on weekends: On Call pager: [phone] ? ?

## 2021-12-24 NOTE — Progress Notes (Signed)
IV consult placed for assessment. Attempted x2; unable to thread catheter x2. Primary RN notified. Upper arms assessed as well without any suitable veins present. Recommending central line for immediate access. ? ?Oluwadamilola Rosamond Loyola Mast, RN ? ?

## 2021-12-24 NOTE — ED Provider Notes (Signed)
?[facility] ?Provider Note ? ? ?CSN: [id number] ?Arrival date & time: 12/24/21  0840 ? ?  ? ?History ? ?Chief Complaint  ?Patient presents with  ? Nausea  ? Emesis  ? ? ?Beverly Wells is a 31 y.o. female. ? ?HPI ?Patient reports she is a type I insulin diabetic.  The patient has been in [place] jail for several days.  Patient reports he has been 2 days without insulin.  She reports she started vomiting at 3 AM.  She reports she knows she is in DKA.  She reports she has diffuse abdominal pain.  She reports this morning she was given 15 units of NPH and 20 units of regular insulin at 5 AM. ?  ? ?Home Medications ?Prior to Admission medications   ?Medication Sig Start Date End Date Taking? Authorizing Provider  ?cetirizine (ZYRTEC) 10 MG tablet Take 10 mg by mouth at bedtime.   Yes [provider]  ?insulin NPH Human (NOVOLIN N) 100 UNIT/ML injection Inject 20 Units into the skin 2 (two) times daily.   Yes [provider]  ?insulin regular (NOVOLIN R) 100 units/mL injection Inject into the skin 3 (three) times daily before meals. Per sliding scale: not provided   Yes [provider]  ?blood glucose meter kit and supplies KIT Dispense based on patient and insurance preference. Use up to four times daily as directed. 03/14/21   Donne Hazel, MD  ?insulin NPH-regular Human (NOVOLIN 70/30 RELION) (70-30) 100 UNIT/ML injection Inject 35 Units into the skin 2 (two) times daily with a meal. 03/14/21   Donne Hazel, MD  ?   ? ?Allergies    ?Peanut butter flavor, Penicillins, Banana, Orange concentrate [flavoring agent], Other, and Latex   ? ?Review of Systems   ?Review of Systems ?10 systems reviewed and negative except as per HPI ?Physical Exam ?Updated Vital Signs ?BP 103/63   Pulse (!) 103   Resp (!) 21   Ht _0  (1.702 m)   Wt 72.6 kg   SpO2 97%   BMI 25.06 kg/m?  ?Physical Exam ?Constitutional:   ?   Comments: Patient is alert with clear  mental status.  Nontoxic.  No respiratory distress  ?HENT:  ?   Mouth/Throat:  ?   Pharynx: Oropharynx is clear.  ?Eyes:  ?   Extraocular Movements: Extraocular movements intact.  ?Cardiovascular:  ?   Comments: Tachycardia.  No gross rub or gallop. ?Pulmonary:  ?   Effort: Pulmonary effort is normal.  ?   Breath sounds: Normal breath sounds.  ?Abdominal:  ?   General: There is no distension.  ?   Palpations: Abdomen is soft.  ?   Tenderness: There is abdominal tenderness. There is no guarding.  ?Musculoskeletal:     ?   General: No swelling. Normal range of motion.  ?   Right lower leg: No edema.  ?   Left lower leg: No edema.  ?Skin: ?   General: Skin is warm and dry.  ?Neurological:  ?   General: No focal deficit present.  ?   Mental Status: She is oriented to person, place, and time.  ?   Coordination: Coordination normal.  ?Psychiatric:     ?   Mood and Affect: Mood normal.  ? ? ?ED Results / Procedures / Treatments   ?Labs ?(all labs ordered are listed, but only abnormal results are displayed) ?Labs Reviewed  ?BASIC METABOLIC PANEL - Abnormal; Notable for the  following components:  ?    Result Value  ? Sodium 133 (*)   ? Potassium 5.4 (*)   ? Chloride 96 (*)   ? CO2 13 (*)   ? Glucose, Bld 622 (*)   ? Creatinine, Ser 1.66 (*)   ? GFR, Estimated 43 (*)   ? Anion gap 24 (*)   ? All other components within normal limits  ?BETA-HYDROXYBUTYRIC ACID - Abnormal; Notable for the following components:  ? Beta-Hydroxybutyric Acid 7.88 (*)   ? All other components within normal limits  ?CBC WITH DIFFERENTIAL/PLATELET - Abnormal; Notable for the following components:  ? WBC 14.4 (*)   ? RBC 5.24 (*)   ? MCV 75.4 (*)   ? MCH 23.1 (*)   ? Neutro Abs 13.5 (*)   ? Lymphs Abs 0.6 (*)   ? All other components within normal limits  ?CBG MONITORING, ED - Abnormal; Notable for the following components:  ? Glucose-Capillary >600 (*)   ? All other components within normal limits  ?CBG MONITORING, ED - Abnormal; Notable for the  following components:  ? Glucose-Capillary 521 (*)   ? All other components within normal limits  ?I-STAT VENOUS BLOOD GAS, ED - Abnormal; Notable for the following components:  ? pH, Ven 7.177 (*)   ? pO2, Ven 57 (*)   ? Bicarbonate 16.5 (*)   ? TCO2 18 (*)   ? Acid-base deficit 12.0 (*)   ? Sodium 133 (*)   ? Potassium 5.2 (*)   ? All other components within normal limits  ?CBG MONITORING, ED - Abnormal; Notable for the following components:  ? Glucose-Capillary 361 (*)   ? All other components within normal limits  ?BASIC METABOLIC PANEL  ?BASIC METABOLIC PANEL  ?BASIC METABOLIC PANEL  ?BETA-HYDROXYBUTYRIC ACID  ?URINALYSIS, ROUTINE W REFLEX MICROSCOPIC  ?BASIC METABOLIC PANEL  ?BETA-HYDROXYBUTYRIC ACID  ?I-STAT BETA HCG BLOOD, ED ([facility], [facility], [facility] ONLY)  ? ? ?EKG ?EKG Interpretation ? ?Date/Time:  Thursday Dec 24 2021 08:45:16 EDT ?Ventricular Rate:  97 ?PR Interval:  130 ?QRS Duration: 68 ?QT Interval:  340 ?QTC Calculation: 432 ?R Axis:   79 ?Text Interpretation: Sinus rhythm no sig change from previous Confirmed by Charlesetta Shanks [phone]) on 12/25/2021 1:11:41 PM ? ?Radiology ?DG Chest Portable 1 View ? ?Result Date: 12/24/2021 ?CLINICAL DATA:  Vomiting EXAM: PORTABLE CHEST 1 VIEW COMPARISON:  Chest radiograph 07/22/2021 FINDINGS: Unchanged cardiomediastinal silhouette. There is no focal airspace disease. There is no pleural effusion. There is no pneumothorax. There is no acute osseous abnormality. IMPRESSION: No evidence of acute cardiopulmonary disease. Electronically Signed   By: Maurine Simmering M.D.   On: 12/24/2021 11:25   ? ?Procedures ?Procedures  ? ?CRITICAL CARE ?Performed by: Charlesetta Shanks ? ? ?Total critical care time: 60 minutes ? ?Critical care time was exclusive of separately billable procedures and treating other patients. ? ?Critical care was necessary to treat or prevent imminent or life-threatening deterioration. ? ?Critical care was time spent personally by me on the following activities: development  of treatment plan with patient and/or surrogate as well as nursing, discussions with consultants, evaluation of patient's response to treatment, examination of patient, obtaining history from patient or surrogate, ordering and performing treatments and interventions, ordering and review of laboratory studies, ordering and review of radiographic studies, pulse oximetry and re-evaluation of patient's condition.  ?Medications Ordered in ED ?Medications  ?insulin regular, human (MYXREDLIN) 100 units/ 100 mL infusion (4.8 Units/hr Intravenous New Bag/Given 12/24/21 1200)  ?lactated  ringers infusion ( Intravenous New Bag/Given 12/24/21 1158)  ?dextrose 5 % in lactated ringers infusion (has no administration in time range)  ?dextrose 50 % solution 0-50 mL (has no administration in time range)  ?lactated ringers bolus 1,452 mL (0 mLs Intravenous Stopped 12/24/21 1141)  ?promethazine (PHENERGAN) injection 25 mg (25 mg Intramuscular Given 12/24/21 1040)  ?ondansetron (ZOFRAN-ODT) disintegrating tablet 4 mg (4 mg Oral Given 12/24/21 1031)  ? ? ?ED Course/ Medical Decision Making/ A&P ?  ?                        ?Medical Decision Making ?Amount and/or Complexity of Data Reviewed ?Labs: ordered. ?Radiology: ordered. ? ?Risk ?Prescription drug management. ?Decision regarding hospitalization. ? ? ?Patient is known type I diabetic.  She has not had insulin for 2 days.  Patient reports she started having recurrent vomiting at 3 AM.  Blood sugar reading in the emergency department is greater than 600.  Will initiate DKA order set and fluid resuscitation.  Patient reports she was given NPH insulin and regular insulin at 5 AM we will continue to, closely monitor blood sugars, mental status and respiratory status ? ?Nurses and IV team unable to establish peripheral IV.  [place] IV done by myself at 10: 30 ? ?Angiocath insertion ?Performed by: Charlesetta Shanks ? ?Consent: Verbal consent obtained. ?Risks and benefits: risks, benefits and alternatives  were discussed ?Time out: Immediately prior to procedure a "time out" was called to verify the correct patient, procedure, equipment, support staff and site/side marked as required. ? ?Preparation: Patient was p

## 2021-12-24 NOTE — ED Notes (Signed)
Multiple attempts mad at trying to get IV by staff and IV team unsuccessful EDP  aware.  ?

## 2021-12-24 NOTE — ED Triage Notes (Signed)
Patient [facility] ED [facility], states she didn't receive her insulin yest because she was transferred from a jail 4 hours ago , c/o vomiting this am 0430 , states  they checked her sugar and it read high, with large ketones. Was given 20 u NPH this am. Upon arrival to ed patient is actively vomiting fruity odor to breathe. Sheriff at bedside.  ? ?

## 2021-12-24 NOTE — Progress Notes (Signed)
Endo tool indicated it may be time to transition off insulin drip. MD made aware, no changes at this time.   ?

## 2021-12-24 NOTE — Hospital Course (Addendum)
[name] a 31 year old female with a past medical history significant for type 1 diabetes that is poorly controlled in the setting of intermittent inability to afford her medications. ? ?Patient states that she is currently at a correctional facility and [facility].  For unclear reasons she was an a facility in [facility] 2 days before the day of admission.  She states that she woke up early at 8 AM and was unable to get her usual dose of insulin which is 20 units of NPH in the a.m. and 20 units of NPH in the afternoon.  She also has sliding scale insulin that she takes during this time.  Patient states that when she arrived back to [facility] she did not get her next dose of insulin until approximately 4 AM on the day of admission.  ? ?By this time patient states that she had developed nausea, vomiting, and abdominal pain.  Her urine ketones were checked and were noted to be elevated.  She was given 20 units of regular insulin and 15 units of NPH.  She continued to be symptomatic and was subsequently transferred to [facility] for further management.   ? ? ?She denies any fevers, chills, chest pain, diarrhea.   ? ? ?Of note, patient states that she was diagnosed with type 1 diabetes at the age of 24.  At that time she followed with a endocrinologist at [facility] but subsequently transferred her care to to [facility].  However, unfortunately after the age of 23 she states that her insurance has relapsed intermittently and she does not have regular care.  As a result she has 1-2 hospitalizations for diabetic ketoacidosis a year since that time.   ? ?She most recently was hospitalized 04/2021 at [facility] that time she required overnight admission for diabetic ketoacidosis.  Her symptoms quickly resolved at that time.   ? ?When evaluated at the bedside, patient denies further episodes of emesis, has some mild nausea and diffuse abdominal  pain. ? ? ?PMH: ?Type 1 diabetes, poorly controlled last A1c greater than 14 (01/06/2021) ?Eyes last checked in 2022, per patient report no evidence of diabetic retinopathy ? ?Medications: ?NPH 20 units twice daily plus sliding scale insulin with each dose ? ? ?Allergies (PCN, tramadol, medicinen from [facility] (got medicine for nausea or anxiety just hives.  ? ? ?PSH:  ?None ? ?SH:  ?No current tobacco use ?No alcohol use ?Lives wife in [place], currently housed in a correctional facility ? ?FH:  ?Mom healthy ?Dad healthy ?Parents live in [place], [place] ? ? ? ? ?

## 2021-12-24 NOTE — Plan of Care (Signed)

## 2021-12-25 DIAGNOSIS — D500 Iron deficiency anemia secondary to blood loss (chronic): Secondary | ICD-10-CM

## 2021-12-25 DIAGNOSIS — D509 Iron deficiency anemia, unspecified: Secondary | ICD-10-CM | POA: Diagnosis present

## 2021-12-25 DIAGNOSIS — E1010 Type 1 diabetes mellitus with ketoacidosis without coma: Principal | ICD-10-CM

## 2021-12-25 LAB — GLUCOSE, CAPILLARY
Glucose-Capillary: 110 mg/dL — ABNORMAL HIGH (ref 70–99)
Glucose-Capillary: 112 mg/dL — ABNORMAL HIGH (ref 70–99)
Glucose-Capillary: 117 mg/dL — ABNORMAL HIGH (ref 70–99)
Glucose-Capillary: 127 mg/dL — ABNORMAL HIGH (ref 70–99)
Glucose-Capillary: 128 mg/dL — ABNORMAL HIGH (ref 70–99)
Glucose-Capillary: 139 mg/dL — ABNORMAL HIGH (ref 70–99)
Glucose-Capillary: 153 mg/dL — ABNORMAL HIGH (ref 70–99)
Glucose-Capillary: 172 mg/dL — ABNORMAL HIGH (ref 70–99)
Glucose-Capillary: 181 mg/dL — ABNORMAL HIGH (ref 70–99)
Glucose-Capillary: 206 mg/dL — ABNORMAL HIGH (ref 70–99)
Glucose-Capillary: 214 mg/dL — ABNORMAL HIGH (ref 70–99)

## 2021-12-25 LAB — IRON AND TIBC
Iron: 28 ug/dL (ref 28–170)
Saturation Ratios: 9 % — ABNORMAL LOW (ref 10.4–31.8)
TIBC: 321 ug/dL (ref 250–450)
UIBC: 293 ug/dL

## 2021-12-25 LAB — CBC
HCT: 29.4 % — ABNORMAL LOW (ref 36.0–46.0)
Hemoglobin: 9.3 g/dL — ABNORMAL LOW (ref 12.0–15.0)
MCH: 23.4 pg — ABNORMAL LOW (ref 26.0–34.0)
MCHC: 31.6 g/dL (ref 30.0–36.0)
MCV: 73.9 fL — ABNORMAL LOW (ref 80.0–100.0)
Platelets: 262 10*3/uL (ref 150–400)
RBC: 3.98 MIL/uL (ref 3.87–5.11)
RDW: 13.8 % (ref 11.5–15.5)
WBC: 8.3 10*3/uL (ref 4.0–10.5)
nRBC: 0 % (ref 0.0–0.2)

## 2021-12-25 LAB — BASIC METABOLIC PANEL
Anion gap: 8 (ref 5–15)
BUN: 12 mg/dL (ref 6–20)
CO2: 22 mmol/L (ref 22–32)
Calcium: 8.7 mg/dL — ABNORMAL LOW (ref 8.9–10.3)
Chloride: 107 mmol/L (ref 98–111)
Creatinine, Ser: 0.9 mg/dL (ref 0.44–1.00)
GFR, Estimated: >60 mL/min (ref >60)
Glucose, Bld: 107 mg/dL — ABNORMAL HIGH (ref 70–99)
Potassium: 3.3 mmol/L — ABNORMAL LOW (ref 3.5–5.1)
Sodium: 137 mmol/L (ref 135–145)

## 2021-12-25 LAB — BETA-HYDROXYBUTYRIC ACID: Beta-Hydroxybutyric Acid: 1.35 mmol/L — ABNORMAL HIGH (ref 0.05–0.27)

## 2021-12-25 LAB — FERRITIN: Ferritin: 28 ng/mL (ref 11–307)

## 2021-12-25 LAB — MAGNESIUM: Magnesium: 1.8 mg/dL (ref 1.7–2.4)

## 2021-12-25 MED ORDER — FERROUS SULFATE 325 (65 FE) MG PO TABS
325.0000 mg | ORAL_TABLET | ORAL | Status: DC
  Administered 2021-12-25: 325 mg via ORAL
  Filled 2021-12-25: qty 1

## 2021-12-25 MED ORDER — INSULIN GLARGINE-YFGN 100 UNIT/ML ~~LOC~~ SOLN
20.0000 [IU] | Freq: Every day | SUBCUTANEOUS | Status: DC
  Administered 2021-12-25: 20 [IU] via SUBCUTANEOUS
  Filled 2021-12-25: qty 0.2

## 2021-12-25 MED ORDER — INSULIN ASPART 100 UNIT/ML IJ SOLN: 0.0000 [IU] | Freq: Three times a day (TID) | INTRAMUSCULAR | Status: DC

## 2021-12-25 MED ORDER — FERROUS SULFATE 325 (65 FE) MG PO TABS: 325.0000 mg | ORAL_TABLET | ORAL | 0 refills | Status: AC

## 2021-12-25 MED ORDER — POTASSIUM CHLORIDE 10 MEQ/100ML IV SOLN
10.0000 meq | INTRAVENOUS | Status: AC
  Administered 2021-12-25: 10 meq via INTRAVENOUS
  Filled 2021-12-25 (×2): qty 100

## 2021-12-25 NOTE — Progress Notes (Signed)
Endo tool considers transition for CBG 112. MD notified, no further order at this time. Will continue to monitor. ?

## 2021-12-25 NOTE — Progress Notes (Signed)
Inpatient Diabetes Program Recommendations ? ?AACE/ADA: New Consensus Statement on Inpatient Glycemic Control (2015) ? ?Target Ranges:  Prepandial:   less than 140 mg/dL ?     Peak postprandial:   less than 180 mg/dL (1-2 hours) ?     Critically ill patients:  140 - 180 mg/dL  ? ?Lab Results  ?Component Value Date  ? GLUCAP 206 (H) 12/25/2021  ? HGBA1C 10.7 (H) 12/24/2021  ? ? ?Review of Glycemic Control ? ?Diabetes history: type 1 ?Outpatient Diabetes medications: NPH 20 units BID, Novolog 2-14 units sliding scale TID ?Current orders for Inpatient glycemic control: IV insulin drip ? ?Inpatient Diabetes Program Recommendations:   ?Noted that patient's drip rate down to 0.6 units/hr. Beta hydroxy is 1.35.  ? ?For transition, recommend Semglee 20 units daily, Novolog SENSITIVE correction scale every 4 hours while NPO, and then TID, Novolog 0-5 units correction scale at HS, Novolog 3 units TID with meals. The Semglee should be given 2 hours before stopping drip. Novolog correction scale to be given at the time of discontinuing the IV insulin.  ? ?Will continue to monitor blood sugars while in the hospital. ? ?Smith Mince RN BSN CDE ?Diabetes Coordinator ?Pager: [phone]  8am-5pm  ? ? ? ?

## 2021-12-25 NOTE — Progress Notes (Signed)
Pt disconnects her IV, refuses IV potassium because it's burning to her veins. Explains to the pt the importance of getting it but pt insisted to stop the infusion. MD made aware. Per MD, hold off for now with the remaining K until switching to PO is made. ?

## 2021-12-25 NOTE — Progress Notes (Signed)
?  Transition of Care (TOC) Screening Note ? ? ?Patient Details  ?Name: Beverly Wells ?Date of Birth: 11/24/1990 ? ? ?Transition of Care (TOC) CM/SW Contact:    ?Zenda Alpers, Lenn Sink, RN ?Phone Number: ?12/25/2021, 11:36 AM ? ? ? ?Acknowledge referral for PCP and medication needs. Transition of Care Department [facility]) has reviewed patient and note that pt is from jail and currently remains in custody. We will continue to monitor patient advancement through interdisciplinary progression rounds. If new patient transition needs arise, please place a TOC consult. ?  ?

## 2021-12-25 NOTE — Discharge Summary (Signed)
? ?Name: Beverly Wells ?MRN: [id number] ?DOB: November 28, 1990 30 y.o. ?PCP: Pcp, No ? ?Date of Admission: 12/24/2021  8:40 AM ?Date of Discharge: 12/25/21 ?Attending Physician: Lucious Groves, DO ? ?Discharge Diagnosis: ?DKA secondary to type 1 diabetes mellitus ?AKI ?Microcytic anemia ? ?Discharge Medications: ?Allergies as of 12/25/2021   ? ?   Reactions  ? Peanut Butter Flavor Anaphylaxis  ? Penicillins Anaphylaxis, Hives, Swelling  ? Has patient had a PCN reaction causing immediate rash, facial/tongue/throat swelling, SOB or lightheadedness with hypotension: yes ?Has patient had a PCN reaction causing severe rash involving mucus membranes or skin necrosis: no ?Has patient had a PCN reaction that required hospitalization: yes ?Has patient had a PCN reaction occurring within the last 10 years: no ?If all of the above answers are "NO", then may proceed with Cephalosporin use.  ? Banana Hives  ? Orange Concentrate [flavoring Agent] Hives  ? Other Hives  ? Reaction to peas and cauliflower  ? Latex Rash  ? ?  ? ?  ?Medication List  ?  ? ?STOP taking these medications   ? ?blood glucose meter kit and supplies Kit ?  ? ?  ? ?TAKE these medications   ? ?cetirizine 10 MG tablet ?Commonly known as: ZYRTEC ?Take 10 mg by mouth at bedtime. ?  ?ferrous sulfate 325 (65 FE) MG tablet ?Take 1 tablet (325 mg total) by mouth every other day. ?Start taking on: Dec 27, 2021 ?  ?insulin NPH Human 100 UNIT/ML injection ?Commonly known as: NOVOLIN N ?Inject 20 Units into the skin 2 (two) times daily. ?  ?insulin regular 100 units/mL injection ?Commonly known as: NOVOLIN R ?Inject 2-14 Units into the skin 3 (three) times daily before meals. Per sliding scale: not provided ?  ? ?  ? ? ?Disposition and follow-up:   ?[name] was discharged from [facility] in Good condition.  At the hospital follow up visit please address: ? ?1. Type 1 diabetes, microcytic anemia ? ?2.  Labs / imaging needed at time of  follow-up: CBC, hemoglobin A1c ? ?3.  Pending labs/ test needing follow-up: none ? ?Follow-up Appointments: ? ? ?Hospital Course by problem list: ?DKA secondary to type 1 diabetes mellitus ?Patient with type 1 diabetes for 20 years. She stated that she was not able to take her insulin for one day given that she was moving from a [facility] facility to a [facility]. She presented here with nausea, vomiting, and abdominal pain. She was found to have K 5.4, AG 24, bicarb 13, elevated beta hydroxybutyrate, and venous pH 7.17 c/f DKA in the setting of withheld doses of her insulin regimen. She was started on endotool and IV fluids as well as potassium supplementation. Her anion gap closed, beta hydroxybutyrate is downtrending, and she reports that she is feeling better and has been able to tolerate PO. She was transitioned off of IV insulin and started back on glargine here as she states that she was on Lantus prior to entering the correctional facility where she was switched to NPH insulin. We have spoken with the correctional facility to ensure that they will be able to continue this at the facility. She can follow up with the internal medicine clinic given that she has had difficulty controlling her type 1 diabetes secondary to financial strain. Her last A1c was >15.5 in July 2022. Diabetes coordinator has evaluated the patient and agrees with the plan.  ? ?Outpatient regimen: NPH 20 units BID,  Novolog 2-14 units sliding scale TID ? ?AKI ?Patient's sCr elevated on admission to 1.66 in the setting of N/V and polyuria. 2/23 her sCr was 0.76 which is likely baseline. Creatinine has returned to 0.90. Will encourage hydration and tight glycemic control at discharge. ? ?Microcytic anemia ?Patient with normal Hgb and microcytosis on AM labs. Her normal Hgb is likely in the setting of hypovolemia. 2/23 her Hgb was 10.9. She states that she has had heavy periods for a long time and has  previously taken iron supplements. Additionally, she states that her hematologist previously considered starting her on birth control to help with this, but she never ended up starting on medication. Her hemoglobin here is stable, iron studies reflect an iron deficient state. She was started on an iron supplement here and will encourage follow-up with hematology as an outpatient. ? ?Discharge Exam:   ?BP 129/82 (BP Location: Left Arm)   Pulse 89   Temp 98.1 ?F (36.7 ?C) (Oral)   Resp 17   Ht '5\' 7"'  (1.702 m)   Wt 72.6 kg   SpO2 99%   BMI 25.06 kg/m?  ?Discharge exam:  ?Constitutional: Appears well-developed and well-nourished. No distress.  ?HENT: Normocephalic and atraumatic, EOMI, conjunctiva normal, moist mucous membranes. ?Cardiovascular: Normal rate, regular rhythm, S1 and S2 present, no murmurs, rubs, gallops.  Distal pulses intact. ?Respiratory: Effort is normal on room air.  Lungs are clear to auscultation bilaterally. ?GI: Soft. Non-distended. Non-tender. No appreciable organomegaly. ?Musculoskeletal: Normal bulk and tone.  No peripheral edema noted. ?Neurological: Alert and oriented x4, no apparent focal deficits noted. ?Skin: Warm and dry.  No rash, erythema, lesions noted. ?Psychiatric: Normal mood and affect. Behavior is normal.  ? ?Pertinent Labs, Studies, and Procedures:  ? ?DG Chest Portable 1 View ? ?Result Date: 12/24/2021 ?CLINICAL DATA:  Vomiting EXAM: PORTABLE CHEST 1 VIEW COMPARISON:  Chest radiograph 07/22/2021 FINDINGS: Unchanged cardiomediastinal silhouette. There is no focal airspace disease. There is no pleural effusion. There is no pneumothorax. There is no acute osseous abnormality. IMPRESSION: No evidence of acute cardiopulmonary disease. Electronically Signed   By: Maurine Simmering M.D.   On: 12/24/2021 11:25  ?  ? ?  Latest Ref Rng & Units 12/25/2021  ?  1:43 AM 12/24/2021  ? 11:04 AM 12/24/2021  ? 10:30 AM  ?CBC  ?WBC 4.0 - 10.5 K/uL 8.3    14.4    ?Hemoglobin 12.0 - 15.0 g/dL 9.3    14.3   12.1    ?Hematocrit 36.0 - 46.0 % 29.4   42.0   39.5    ?Platelets 150 - 400 K/uL 262    343    ?  ? ?  Latest Ref Rng & Units 12/25/2021  ?  1:43 AM 12/24/2021  ?  9:06 PM 12/24/2021  ?  6:09 PM  ?BMP  ?Glucose 70 - 99 mg/dL 107   261   161    ?BUN 6 - 20 mg/dL '12   15   15    ' ?Creatinine 0.44 - 1.00 mg/dL 0.90   1.12   1.14    ?Sodium 135 - 145 mmol/L 137   136   140    ?Potassium 3.5 - 5.1 mmol/L 3.3   3.9   4.3    ?Chloride 98 - 111 mmol/L 107   106   109    ?CO2 22 - 32 mmol/L '22   20   20    ' ?Calcium 8.9 - 10.3 mg/dL 8.7  8.8   9.6    ?  ?Discharge Instructions: ?Discharge Instructions   ? ? Call MD for:  persistant nausea and vomiting   Complete by: As directed ?  ? ?  ? ? ?You were hospitalized for diabetic ketoacidosis and high blood sugar (hyperglycemia). Thank you for allowing [place] to be part of your care.  ? ?We have spoken with the facility who told us that they will be able to manage your medications while you are there.  ? ?Please call our clinic if you have any questions or concerns, we may be able to help and keep you from a long and expensive emergency room wait. Our clinic and after hours phone number is [phone], the best time to call is Monday through Friday 9 am to 4 pm but there is always someone available 24/7 if you have an emergency. If you need medication refills please notify your pharmacy one week in advance and they will send [place] a request.  ? ?Signed: ?Orvis Brill, MD ?12/25/2021, 3:14 PM   ?Pager: [phone]   ?

## 2022-12-02 ENCOUNTER — Encounter: Payer: Self-pay | Admitting: *Deleted
